# Patient Record
Sex: Female | Born: 1950 | Smoking: Former smoker
Health system: Southern US, Community
[De-identification: ages and names within clinical notes are randomized; demographics above are authoritative.]

## PROBLEM LIST (undated history)

## (undated) DIAGNOSIS — F419 Anxiety disorder, unspecified: Secondary | ICD-10-CM

## (undated) DIAGNOSIS — R112 Nausea with vomiting, unspecified: Secondary | ICD-10-CM

## (undated) DIAGNOSIS — T4145XA Adverse effect of unspecified anesthetic, initial encounter: Secondary | ICD-10-CM

## (undated) DIAGNOSIS — J189 Pneumonia, unspecified organism: Secondary | ICD-10-CM

## (undated) DIAGNOSIS — F329 Major depressive disorder, single episode, unspecified: Secondary | ICD-10-CM

## (undated) DIAGNOSIS — E871 Hypo-osmolality and hyponatremia: Secondary | ICD-10-CM

## (undated) DIAGNOSIS — J309 Allergic rhinitis, unspecified: Secondary | ICD-10-CM

## (undated) DIAGNOSIS — M1990 Unspecified osteoarthritis, unspecified site: Secondary | ICD-10-CM

## (undated) DIAGNOSIS — T7840XA Allergy, unspecified, initial encounter: Secondary | ICD-10-CM

## (undated) DIAGNOSIS — G709 Myoneural disorder, unspecified: Secondary | ICD-10-CM

## (undated) DIAGNOSIS — H269 Unspecified cataract: Secondary | ICD-10-CM

## (undated) DIAGNOSIS — E785 Hyperlipidemia, unspecified: Secondary | ICD-10-CM

## (undated) DIAGNOSIS — Z9889 Other specified postprocedural states: Secondary | ICD-10-CM

## (undated) DIAGNOSIS — E8889 Other specified metabolic disorders: Secondary | ICD-10-CM

## (undated) DIAGNOSIS — F32A Depression, unspecified: Secondary | ICD-10-CM

## (undated) DIAGNOSIS — M48061 Spinal stenosis, lumbar region without neurogenic claudication: Secondary | ICD-10-CM

## (undated) DIAGNOSIS — E119 Type 2 diabetes mellitus without complications: Secondary | ICD-10-CM

## (undated) DIAGNOSIS — C801 Malignant (primary) neoplasm, unspecified: Secondary | ICD-10-CM

## (undated) DIAGNOSIS — K219 Gastro-esophageal reflux disease without esophagitis: Secondary | ICD-10-CM

## (undated) DIAGNOSIS — H409 Unspecified glaucoma: Secondary | ICD-10-CM

## (undated) DIAGNOSIS — I10 Essential (primary) hypertension: Secondary | ICD-10-CM

## (undated) HISTORY — PX: CATARACT EXTRACTION: SUR2

## (undated) HISTORY — DX: Gastro-esophageal reflux disease without esophagitis: K21.9

## (undated) HISTORY — PX: EYE SURGERY: SHX253

## (undated) HISTORY — PX: COLONOSCOPY: SHX174

## (undated) HISTORY — PX: BACK SURGERY: SHX140

## (undated) HISTORY — DX: Allergy, unspecified, initial encounter: T78.40XA

## (undated) HISTORY — DX: Myoneural disorder, unspecified: G70.9

## (undated) HISTORY — DX: Essential (primary) hypertension: I10

## (undated) HISTORY — PX: TONSILLECTOMY: SUR1361

---

## 1971-02-05 DIAGNOSIS — T8859XA Other complications of anesthesia, initial encounter: Secondary | ICD-10-CM

## 1971-02-05 DIAGNOSIS — E8889 Other specified metabolic disorders: Secondary | ICD-10-CM

## 1971-02-05 HISTORY — DX: Other complications of anesthesia, initial encounter: T88.59XA

## 1971-02-05 HISTORY — DX: Other specified metabolic disorders: E88.89

## 2012-01-22 DIAGNOSIS — Z9889 Other specified postprocedural states: Secondary | ICD-10-CM | POA: Insufficient documentation

## 2012-01-22 DIAGNOSIS — F32A Depression, unspecified: Secondary | ICD-10-CM | POA: Insufficient documentation

## 2012-01-22 DIAGNOSIS — I10 Essential (primary) hypertension: Secondary | ICD-10-CM | POA: Diagnosis present

## 2013-02-04 HISTORY — PX: COLONOSCOPY: SHX174

## 2015-05-06 DIAGNOSIS — J189 Pneumonia, unspecified organism: Secondary | ICD-10-CM

## 2015-05-06 DIAGNOSIS — E871 Hypo-osmolality and hyponatremia: Secondary | ICD-10-CM

## 2015-05-06 HISTORY — DX: Hypo-osmolality and hyponatremia: E87.1

## 2015-05-06 HISTORY — DX: Pneumonia, unspecified organism: J18.9

## 2015-06-13 DIAGNOSIS — E109 Type 1 diabetes mellitus without complications: Secondary | ICD-10-CM | POA: Insufficient documentation

## 2015-06-13 DIAGNOSIS — H409 Unspecified glaucoma: Secondary | ICD-10-CM | POA: Insufficient documentation

## 2015-06-13 DIAGNOSIS — J309 Allergic rhinitis, unspecified: Secondary | ICD-10-CM | POA: Diagnosis present

## 2015-07-13 DIAGNOSIS — F4320 Adjustment disorder, unspecified: Secondary | ICD-10-CM | POA: Diagnosis present

## 2015-12-18 ENCOUNTER — Other Ambulatory Visit: Payer: Self-pay | Admitting: Neurosurgery

## 2016-02-07 ENCOUNTER — Inpatient Hospital Stay (HOSPITAL_COMMUNITY): Admission: RE | Admit: 2016-02-07 | Payer: Self-pay | Source: Ambulatory Visit

## 2016-02-13 ENCOUNTER — Encounter (HOSPITAL_COMMUNITY)
Admission: RE | Admit: 2016-02-13 | Discharge: 2016-02-13 | Disposition: A | Discharge status: Home / Self Care | Payer: Medicare Other | Source: Ambulatory Visit | Attending: Neurosurgery | Admitting: Neurosurgery

## 2016-02-13 ENCOUNTER — Ambulatory Visit (HOSPITAL_COMMUNITY)
Admission: RE | Admit: 2016-02-13 | Discharge: 2016-02-13 | Disposition: A | Discharge status: Home / Self Care | Payer: Medicare Other | Source: Ambulatory Visit | Attending: Anesthesiology | Admitting: Anesthesiology

## 2016-02-13 ENCOUNTER — Encounter (HOSPITAL_COMMUNITY): Payer: Self-pay

## 2016-02-13 DIAGNOSIS — Z01810 Encounter for preprocedural cardiovascular examination: Secondary | ICD-10-CM | POA: Insufficient documentation

## 2016-02-13 DIAGNOSIS — Z01818 Encounter for other preprocedural examination: Secondary | ICD-10-CM | POA: Insufficient documentation

## 2016-02-13 DIAGNOSIS — E108 Type 1 diabetes mellitus with unspecified complications: Secondary | ICD-10-CM | POA: Insufficient documentation

## 2016-02-13 HISTORY — DX: Pneumonia, unspecified organism: J18.9

## 2016-02-13 HISTORY — DX: Unspecified glaucoma: H40.9

## 2016-02-13 HISTORY — DX: Other specified postprocedural states: Z98.890

## 2016-02-13 HISTORY — DX: Other specified metabolic disorders: E88.89

## 2016-02-13 HISTORY — DX: Allergic rhinitis, unspecified: J30.9

## 2016-02-13 HISTORY — DX: Hypo-osmolality and hyponatremia: E87.1

## 2016-02-13 HISTORY — DX: Adverse effect of unspecified anesthetic, initial encounter: T41.45XA

## 2016-02-13 HISTORY — DX: Hyperlipidemia, unspecified: E78.5

## 2016-02-13 HISTORY — DX: Type 2 diabetes mellitus without complications: E11.9

## 2016-02-13 HISTORY — DX: Anxiety disorder, unspecified: F41.9

## 2016-02-13 HISTORY — DX: Unspecified cataract: H26.9

## 2016-02-13 HISTORY — DX: Major depressive disorder, single episode, unspecified: F32.9

## 2016-02-13 HISTORY — DX: Other specified postprocedural states: R11.2

## 2016-02-13 HISTORY — DX: Unspecified osteoarthritis, unspecified site: M19.90

## 2016-02-13 HISTORY — DX: Depression, unspecified: F32.A

## 2016-02-13 HISTORY — DX: Spinal stenosis, lumbar region without neurogenic claudication: M48.061

## 2016-02-13 HISTORY — DX: Malignant (primary) neoplasm, unspecified: C80.1

## 2016-02-13 LAB — BASIC METABOLIC PANEL
Anion gap: 6 (ref 5–15)
BUN: 13 mg/dL (ref 6–20)
CO2: 28 mmol/L (ref 22–32)
Calcium: 9.5 mg/dL (ref 8.9–10.3)
Chloride: 101 mmol/L (ref 101–111)
Creatinine, Ser: 0.79 mg/dL (ref 0.44–1.00)
GFR calc Af Amer: >60 mL/min (ref >60)
GFR calc non Af Amer: >60 mL/min (ref >60)
Glucose, Bld: 116 mg/dL — ABNORMAL HIGH (ref 65–99)
Potassium: 4 mmol/L (ref 3.5–5.1)
Sodium: 135 mmol/L (ref 135–145)

## 2016-02-13 LAB — CBC
HCT: 37.3 % (ref 36.0–46.0)
Hemoglobin: 12.7 g/dL (ref 12.0–15.0)
MCH: 31.2 pg (ref 26.0–34.0)
MCHC: 34 g/dL (ref 30.0–36.0)
MCV: 91.6 fL (ref 78.0–100.0)
Platelets: 272 10*3/uL (ref 150–400)
RBC: 4.07 MIL/uL (ref 3.87–5.11)
RDW: 12 % (ref 11.5–15.5)
WBC: 5.1 10*3/uL (ref 4.0–10.5)

## 2016-02-13 LAB — SURGICAL PCR SCREEN
MRSA, PCR: NEGATIVE
Staphylococcus aureus: NEGATIVE

## 2016-02-13 LAB — TYPE AND SCREEN
ABO/RH(D): O POS
Antibody Screen: NEGATIVE

## 2016-02-13 LAB — GLUCOSE, CAPILLARY: GLUCOSE-CAPILLARY: 108 mg/dL — AB (ref 65–99)

## 2016-02-13 LAB — ABO/RH: ABO/RH(D): O POS

## 2016-02-13 NOTE — Pre-Procedure Instructions (Addendum)
Connie Huang  02/13/2016      [facility], [address] [place] [address] Phone: [phone] Fax: [phone]    Your procedure is scheduled on Thursday, January 11.               Report to [facility] Admitting at 10:00 AM                Your surgery or procedure is scheduled for 1200 Noon   Call this number if you have problems the morning of surgery: [phone]                   For any other questions, please call [phone], Monday - Friday 8 AM - 4 PM.   Remember:  Do not eat food or drink liquids after midnight Wednesday, January 10.  Take these medicines the morning of surgery with A SIP OF WATER:gabapentin (NEURONTIN), venlafaxine (EFFEXOR).                   STOP taking Aspirin, Aspirin Products (Goody Powder, Excedrin Migraine), Ibuprofen (Advil), Naproxen (Aleve), Vitamins and Herbal Products (ie Fish Oil)                      AT Midnight on Wednesday, reduce basal rates by 20 %, unless instructed differently by your Endocrinologist.    How to Manage Your Diabetes Before and After Surgery  Why is it important to control my blood sugar before and after surgery? . Improving blood sugar levels before and after surgery helps healing and can limit problems. . A way of improving blood sugar control is eating a healthy diet by: o  Eating less sugar and carbohydrates o  Increasing activity/exercise o  Talking with your doctor about reaching your blood sugar goals . High blood sugars (greater than 180 mg/dL) can raise your risk of infections and slow your recovery, so you will need to focus on controlling your diabetes during the weeks before surgery. . Make sure that the doctor who takes care of your diabetes knows about your planned surgery including the date and location.  How do I manage my blood sugar before surgery? . Check your blood sugar at least 4 times a day, starting 2 days before  surgery, to make sure that the level is not too high or low. o Check your blood sugar the morning of your surgery when you wake up and every 2 hours until you get to the Short Stay unit. . If your blood sugar is less than 70 mg/dL, you will need to treat for low blood sugar: o Do not take insulin. o Treat a low blood sugar (less than 70 mg/dL) with  cup of clear juice (cranberry or apple), 4 glucose tablets, OR glucose gel. o Recheck blood sugar in 15 minutes after treatment (to make sure it is greater than 70 mg/dL). If your blood sugar is not greater than 70 mg/dL on recheck, call [phone] for further instructions. . Report your blood sugar to the short stay nurse when you get to Short Stay.  . If you are admitted to the hospital after surgery: o Your blood sugar will be checked by the staff and you will probably be given insulin after surgery (instead of oral diabetes medicines) to make sure you have good blood sugar levels. o The goal for blood sugar control after surgery is 80-180 mg/dL.  WHAT DO I DO ABOUT MY DIABETES MEDICATION     12:01 AM Thursday, reduce all basal rates by 20% unless instructed differently by your endocrinologist on how to manage Insulin when you are not eating or drinking.  Other Instructions:   Bring Insulin Pump supplies with you to the hospital.  Patient Signature:  Date:   Nurse Signature:  Date:    Do not wear jewelry, make-up or nail polish.  Do not wear lotions, powders, or perfumes, or deodorant.  Do not shave 48 hours prior to surgery.    Do not bring valuables to the hospital.  [facility] is not responsible for any belongings or valuables.  Contacts, dentures or bridgework may not be worn into surgery.  Leave your suitcase in the car.  After surgery it may be brought to your room.  For patients admitted to the hospital, discharge time will be determined by your treatment team.  Special instructions:  Review  [place] - Preparing For  Surgery.  Please read over the following fact sheets that you were given: [facility]- Preparing For Surgery and Patient Instructions for Mupirocin Application, Incentive Spirometry, Pain Booklet

## 2016-02-13 NOTE — Progress Notes (Signed)
Connie Huang has a history of Type I diabetes, she has an Insulin Pump. Insulin Pump has 2 Basal rates- 1 is set at 1200 mn and one at 0800, patient was instructed to  Decrease basal rates by 20 % at 2400 - Wednesday and 0800 Thursday.  Patient voiced understanding.  Patient was seen 02/06/16 at PCP in [place], [place].  Patient was started on an antibiotic and Tussionex.. Patient will complete Antibiotic on 02/15/16.  Connie Cadenas reports feeling much better.  Patient will follow up with PCP on 02/14/16 and she will receive lab results that were drawn on 02/06/16 then, one of the labs was an A1C.  I have requested labs and office notes.

## 2016-02-15 ENCOUNTER — Inpatient Hospital Stay (HOSPITAL_COMMUNITY)
Admission: RE | Admit: 2016-02-15 | Discharge: 2016-02-16 | DRG: 458 | Disposition: A | Discharge status: Home / Self Care | Payer: Medicare Other | Source: Ambulatory Visit | Attending: Neurosurgery | Admitting: Neurosurgery

## 2016-02-15 ENCOUNTER — Inpatient Hospital Stay (HOSPITAL_COMMUNITY): Payer: Medicare Other

## 2016-02-15 ENCOUNTER — Inpatient Hospital Stay (HOSPITAL_COMMUNITY): Payer: Medicare Other | Admitting: Certified Registered Nurse Anesthetist

## 2016-02-15 ENCOUNTER — Encounter (HOSPITAL_COMMUNITY): Payer: Self-pay | Admitting: Certified Registered Nurse Anesthetist

## 2016-02-15 ENCOUNTER — Encounter (HOSPITAL_COMMUNITY)
Admission: RE | Disposition: A | Discharge status: Home / Self Care | Payer: Self-pay | Source: Ambulatory Visit | Attending: Neurosurgery

## 2016-02-15 DIAGNOSIS — Z85820 Personal history of malignant melanoma of skin: Secondary | ICD-10-CM

## 2016-02-15 DIAGNOSIS — M549 Dorsalgia, unspecified: Secondary | ICD-10-CM | POA: Diagnosis present

## 2016-02-15 DIAGNOSIS — F419 Anxiety disorder, unspecified: Secondary | ICD-10-CM | POA: Diagnosis present

## 2016-02-15 DIAGNOSIS — H409 Unspecified glaucoma: Secondary | ICD-10-CM | POA: Diagnosis present

## 2016-02-15 DIAGNOSIS — E119 Type 2 diabetes mellitus without complications: Secondary | ICD-10-CM | POA: Diagnosis present

## 2016-02-15 DIAGNOSIS — M48061 Spinal stenosis, lumbar region without neurogenic claudication: Secondary | ICD-10-CM | POA: Diagnosis present

## 2016-02-15 DIAGNOSIS — M4186 Other forms of scoliosis, lumbar region: Secondary | ICD-10-CM | POA: Diagnosis present

## 2016-02-15 DIAGNOSIS — Z9641 Presence of insulin pump (external) (internal): Secondary | ICD-10-CM | POA: Diagnosis present

## 2016-02-15 DIAGNOSIS — Z981 Arthrodesis status: Secondary | ICD-10-CM | POA: Diagnosis not present

## 2016-02-15 DIAGNOSIS — Z794 Long term (current) use of insulin: Secondary | ICD-10-CM

## 2016-02-15 DIAGNOSIS — M5116 Intervertebral disc disorders with radiculopathy, lumbar region: Secondary | ICD-10-CM | POA: Diagnosis present

## 2016-02-15 DIAGNOSIS — I10 Essential (primary) hypertension: Secondary | ICD-10-CM | POA: Diagnosis present

## 2016-02-15 DIAGNOSIS — M419 Scoliosis, unspecified: Secondary | ICD-10-CM | POA: Diagnosis present

## 2016-02-15 DIAGNOSIS — Z419 Encounter for procedure for purposes other than remedying health state, unspecified: Secondary | ICD-10-CM

## 2016-02-15 HISTORY — PX: ANTERIOR LAT LUMBAR FUSION: SHX1168

## 2016-02-15 LAB — GLUCOSE, CAPILLARY
GLUCOSE-CAPILLARY: 227 mg/dL — AB (ref 65–99)
GLUCOSE-CAPILLARY: 300 mg/dL — AB (ref 65–99)
GLUCOSE-CAPILLARY: 293 mg/dL — AB (ref 65–99)
Glucose-Capillary: 240 mg/dL — ABNORMAL HIGH (ref 65–99)
Glucose-Capillary: 236 mg/dL — ABNORMAL HIGH (ref 65–99)
Glucose-Capillary: 304 mg/dL — ABNORMAL HIGH (ref 65–99)
Glucose-Capillary: 311 mg/dL — ABNORMAL HIGH (ref 65–99)

## 2016-02-15 SURGERY — ANTERIOR LATERAL LUMBAR FUSION 1 LEVEL
Anesthesia: General | Site: Flank | Laterality: Left

## 2016-02-15 MED ORDER — GABAPENTIN 800 MG PO TABS
800.0000 mg | ORAL_TABLET | Freq: Three times a day (TID) | ORAL | Status: DC
  Filled 2016-02-15: qty 1

## 2016-02-15 MED ORDER — HYDROMORPHONE HCL 1 MG/ML IJ SOLN
INTRAMUSCULAR | Status: AC
  Filled 2016-02-15: qty 1

## 2016-02-15 MED ORDER — CEFAZOLIN SODIUM-DEXTROSE 2-4 GM/100ML-% IV SOLN
2.0000 g | Freq: Three times a day (TID) | INTRAVENOUS | Status: AC
  Administered 2016-02-15 – 2016-02-16 (×2): 2 g via INTRAVENOUS
  Filled 2016-02-15 (×2): qty 100

## 2016-02-15 MED ORDER — ROCURONIUM BROMIDE 50 MG/5ML IV SOSY
PREFILLED_SYRINGE | INTRAVENOUS | Status: AC
  Filled 2016-02-15: qty 5

## 2016-02-15 MED ORDER — LIDOCAINE-EPINEPHRINE (PF) 2 %-1:200000 IJ SOLN
INTRAMUSCULAR | Status: AC
  Filled 2016-02-15: qty 20

## 2016-02-15 MED ORDER — FENTANYL CITRATE (PF) 100 MCG/2ML IJ SOLN
INTRAMUSCULAR | Status: AC
  Filled 2016-02-15: qty 2

## 2016-02-15 MED ORDER — FENTANYL CITRATE (PF) 100 MCG/2ML IJ SOLN
INTRAMUSCULAR | Status: DC | PRN
  Administered 2016-02-15 (×2): 25 ug via INTRAVENOUS
  Administered 2016-02-15: 50 ug via INTRAVENOUS
  Administered 2016-02-15: 100 ug via INTRAVENOUS

## 2016-02-15 MED ORDER — LIDOCAINE-EPINEPHRINE 1 %-1:100000 IJ SOLN
INTRAMUSCULAR | Status: DC | PRN
  Administered 2016-02-15: 5 mL

## 2016-02-15 MED ORDER — SM OMEGA-3-6-9 FATTY ACIDS PO CAPS: ORAL_CAPSULE | Freq: Two times a day (BID) | ORAL | Status: DC

## 2016-02-15 MED ORDER — HYDROMORPHONE HCL 1 MG/ML IJ SOLN
0.5000 mg | INTRAMUSCULAR | Status: DC | PRN
  Administered 2016-02-15: 1 mg via INTRAVENOUS

## 2016-02-15 MED ORDER — HYDROCOD POLST-CPM POLST ER 10-8 MG/5ML PO SUER: 5.0000 mL | Freq: Two times a day (BID) | ORAL | Status: DC | PRN

## 2016-02-15 MED ORDER — SODIUM CHLORIDE 0.9% FLUSH: 3.0000 mL | Freq: Two times a day (BID) | INTRAVENOUS | Status: DC

## 2016-02-15 MED ORDER — PANTOPRAZOLE SODIUM 40 MG IV SOLR: 40.0000 mg | Freq: Every day | INTRAVENOUS | Status: DC

## 2016-02-15 MED ORDER — MIDAZOLAM HCL 5 MG/5ML IJ SOLN
INTRAMUSCULAR | Status: DC | PRN
  Administered 2016-02-15: 2 mg via INTRAVENOUS

## 2016-02-15 MED ORDER — ATORVASTATIN CALCIUM 20 MG PO TABS: 10.0000 mg | ORAL_TABLET | Freq: Every day | ORAL | Status: DC

## 2016-02-15 MED ORDER — LACTATED RINGERS IV SOLN
INTRAVENOUS | Status: DC
  Administered 2016-02-15 (×2): via INTRAVENOUS

## 2016-02-15 MED ORDER — CEFAZOLIN SODIUM 1 G IJ SOLR
INTRAMUSCULAR | Status: AC
  Filled 2016-02-15: qty 10

## 2016-02-15 MED ORDER — OXYCODONE-ACETAMINOPHEN 5-325 MG PO TABS
ORAL_TABLET | ORAL | Status: AC
  Filled 2016-02-15: qty 2

## 2016-02-15 MED ORDER — LORATADINE 10 MG PO TABS
10.0000 mg | ORAL_TABLET | Freq: Every day | ORAL | Status: DC
  Administered 2016-02-15: 10 mg via ORAL
  Filled 2016-02-15: qty 1

## 2016-02-15 MED ORDER — CALCIUM CARB-CHOLECALCIFEROL 600-800 MG-UNIT PO TABS
1.0000 | ORAL_TABLET | Freq: Two times a day (BID) | ORAL | Status: DC
Start: 2016-02-15 — End: 2016-02-15

## 2016-02-15 MED ORDER — METHOCARBAMOL 500 MG PO TABS
500.0000 mg | ORAL_TABLET | Freq: Four times a day (QID) | ORAL | Status: DC | PRN
  Administered 2016-02-16: 500 mg via ORAL
  Filled 2016-02-15: qty 1

## 2016-02-15 MED ORDER — INSULIN PUMP
Freq: Three times a day (TID) | SUBCUTANEOUS | Status: DC
  Filled 2016-02-15: qty 1

## 2016-02-15 MED ORDER — BUPIVACAINE HCL (PF) 0.5 % IJ SOLN
INTRAMUSCULAR | Status: AC
  Filled 2016-02-15: qty 30

## 2016-02-15 MED ORDER — DOCUSATE SODIUM 100 MG PO CAPS
100.0000 mg | ORAL_CAPSULE | Freq: Two times a day (BID) | ORAL | Status: DC
  Administered 2016-02-15: 100 mg via ORAL
  Filled 2016-02-15: qty 1

## 2016-02-15 MED ORDER — THROMBIN 5000 UNITS EX SOLR
CUTANEOUS | Status: AC
  Filled 2016-02-15: qty 10000

## 2016-02-15 MED ORDER — KCL IN DEXTROSE-NACL 20-5-0.45 MEQ/L-%-% IV SOLN: INTRAVENOUS | Status: DC

## 2016-02-15 MED ORDER — VENLAFAXINE HCL 75 MG PO TABS
75.0000 mg | ORAL_TABLET | Freq: Every day | ORAL | Status: DC
  Filled 2016-02-15: qty 1

## 2016-02-15 MED ORDER — FENTANYL CITRATE (PF) 100 MCG/2ML IJ SOLN
25.0000 ug | INTRAMUSCULAR | Status: AC | PRN
  Administered 2016-02-15 (×6): 25 ug via INTRAVENOUS

## 2016-02-15 MED ORDER — ONDANSETRON HCL 4 MG/2ML IJ SOLN: 4.0000 mg | INTRAMUSCULAR | Status: DC | PRN

## 2016-02-15 MED ORDER — VITAMIN C 500 MG PO TABS
1000.0000 mg | ORAL_TABLET | Freq: Every day | ORAL | Status: DC
  Administered 2016-02-15: 1000 mg via ORAL
  Filled 2016-02-15 (×2): qty 2

## 2016-02-15 MED ORDER — PROPOFOL 10 MG/ML IV BOLUS
INTRAVENOUS | Status: DC | PRN
  Administered 2016-02-15: 150 mg via INTRAVENOUS

## 2016-02-15 MED ORDER — SODIUM CHLORIDE 0.9% FLUSH: 3.0000 mL | INTRAVENOUS | Status: DC | PRN

## 2016-02-15 MED ORDER — HYDROCODONE-ACETAMINOPHEN 5-325 MG PO TABS: 1.0000 | ORAL_TABLET | ORAL | Status: DC | PRN

## 2016-02-15 MED ORDER — ADULT MULTIVITAMIN W/MINERALS CH: 1.0000 | ORAL_TABLET | Freq: Every day | ORAL | Status: DC

## 2016-02-15 MED ORDER — CALCIUM CARBONATE-VITAMIN D 500-200 MG-UNIT PO TABS
1.0000 | ORAL_TABLET | Freq: Two times a day (BID) | ORAL | Status: DC
  Administered 2016-02-15: 1 via ORAL
  Filled 2016-02-15: qty 1

## 2016-02-15 MED ORDER — LOSARTAN POTASSIUM 50 MG PO TABS
100.0000 mg | ORAL_TABLET | Freq: Every day | ORAL | Status: DC
  Administered 2016-02-15: 100 mg via ORAL
  Filled 2016-02-15: qty 2

## 2016-02-15 MED ORDER — AMOXICILLIN-POT CLAVULANATE 875-125 MG PO TABS: 1.0000 | ORAL_TABLET | Freq: Two times a day (BID) | ORAL | Status: DC

## 2016-02-15 MED ORDER — ACETAMINOPHEN 650 MG RE SUPP: 650.0000 mg | RECTAL | Status: DC | PRN

## 2016-02-15 MED ORDER — ACETAMINOPHEN 325 MG PO TABS: 650.0000 mg | ORAL_TABLET | ORAL | Status: DC | PRN

## 2016-02-15 MED ORDER — GABAPENTIN 400 MG PO CAPS
400.0000 mg | ORAL_CAPSULE | Freq: Three times a day (TID) | ORAL | Status: DC
  Administered 2016-02-15 – 2016-02-16 (×2): 400 mg via ORAL
  Filled 2016-02-15 (×2): qty 1

## 2016-02-15 MED ORDER — INSULIN ASPART 100 UNIT/ML ~~LOC~~ SOLN: 0.0000 [IU] | Freq: Three times a day (TID) | SUBCUTANEOUS | Status: DC

## 2016-02-15 MED ORDER — FLUTICASONE PROPIONATE 50 MCG/ACT NA SUSP
2.0000 | Freq: Every day | NASAL | Status: DC | PRN
  Filled 2016-02-15: qty 16

## 2016-02-15 MED ORDER — ZOLPIDEM TARTRATE 5 MG PO TABS: 5.0000 mg | ORAL_TABLET | Freq: Every evening | ORAL | Status: DC | PRN

## 2016-02-15 MED ORDER — 0.9 % SODIUM CHLORIDE (POUR BTL) OPTIME
TOPICAL | Status: DC | PRN
  Administered 2016-02-15: 1000 mL

## 2016-02-15 MED ORDER — OXYCODONE-ACETAMINOPHEN 5-325 MG PO TABS
1.0000 | ORAL_TABLET | ORAL | Status: DC | PRN
  Administered 2016-02-15 – 2016-02-16 (×3): 2 via ORAL
  Filled 2016-02-15 (×2): qty 2

## 2016-02-15 MED ORDER — BIOTIN 10000 MCG PO TBDP: 10000.0000 ug | ORAL_TABLET | Freq: Every day | ORAL | Status: DC

## 2016-02-15 MED ORDER — TIMOLOL MALEATE 0.5 % OP SOLN
1.0000 [drp] | Freq: Every day | OPHTHALMIC | Status: DC
  Filled 2016-02-15: qty 5

## 2016-02-15 MED ORDER — SENNOSIDES-DOCUSATE SODIUM 8.6-50 MG PO TABS: 1.0000 | ORAL_TABLET | Freq: Every evening | ORAL | Status: DC | PRN

## 2016-02-15 MED ORDER — LIDOCAINE HCL (CARDIAC) 20 MG/ML IV SOLN
INTRAVENOUS | Status: DC | PRN
  Administered 2016-02-15: 100 mg via INTRAVENOUS

## 2016-02-15 MED ORDER — BISACODYL 10 MG RE SUPP: 10.0000 mg | Freq: Every day | RECTAL | Status: DC | PRN

## 2016-02-15 MED ORDER — SODIUM CHLORIDE 0.9 % IV SOLN: 250.0000 mL | INTRAVENOUS | Status: DC

## 2016-02-15 MED ORDER — FLEET ENEMA 7-19 GM/118ML RE ENEM: 1.0000 | ENEMA | Freq: Once | RECTAL | Status: DC | PRN

## 2016-02-15 MED ORDER — INSULIN ASPART 100 UNIT/ML ~~LOC~~ SOLN
0.0000 [IU] | Freq: Three times a day (TID) | SUBCUTANEOUS | Status: DC
  Administered 2016-02-15: 11 [IU] via SUBCUTANEOUS

## 2016-02-15 MED ORDER — ASPIRIN EC 81 MG PO TBEC
81.0000 mg | DELAYED_RELEASE_TABLET | Freq: Every day | ORAL | Status: DC
  Filled 2016-02-15: qty 1

## 2016-02-15 MED ORDER — TIMOLOL HEMIHYDRATE 0.5 % OP SOLN: 1.0000 [drp] | Freq: Every day | OPHTHALMIC | Status: DC

## 2016-02-15 MED ORDER — DEXTROSE 5 % IV SOLN
500.0000 mg | Freq: Four times a day (QID) | INTRAVENOUS | Status: DC | PRN
  Filled 2016-02-15: qty 5

## 2016-02-15 MED ORDER — PANTOPRAZOLE SODIUM 40 MG PO TBEC
40.0000 mg | DELAYED_RELEASE_TABLET | Freq: Every day | ORAL | Status: DC
  Administered 2016-02-15: 40 mg via ORAL
  Filled 2016-02-15: qty 1

## 2016-02-15 MED ORDER — BUPIVACAINE HCL (PF) 0.5 % IJ SOLN
INTRAMUSCULAR | Status: DC | PRN
  Administered 2016-02-15: 5 mL

## 2016-02-15 MED ORDER — PHENOL 1.4 % MT LIQD: 1.0000 | OROMUCOSAL | Status: DC | PRN

## 2016-02-15 MED ORDER — OMEGA-3-ACID ETHYL ESTERS 1 G PO CAPS
1.0000 g | ORAL_CAPSULE | Freq: Two times a day (BID) | ORAL | Status: DC
  Administered 2016-02-15: 1 g via ORAL
  Filled 2016-02-15 (×2): qty 1

## 2016-02-15 MED ORDER — MIDAZOLAM HCL 2 MG/2ML IJ SOLN
INTRAMUSCULAR | Status: AC
  Filled 2016-02-15: qty 2

## 2016-02-15 MED ORDER — SUCCINYLCHOLINE CHLORIDE 20 MG/ML IJ SOLN
INTRAMUSCULAR | Status: DC | PRN
  Administered 2016-02-15: 100 mg via INTRAVENOUS

## 2016-02-15 MED ORDER — MENTHOL 3 MG MT LOZG: 1.0000 | LOZENGE | OROMUCOSAL | Status: DC | PRN

## 2016-02-15 MED ORDER — CEFAZOLIN SODIUM 1 G IJ SOLR
INTRAMUSCULAR | Status: DC | PRN
  Administered 2016-02-15: 2 g via INTRAMUSCULAR

## 2016-02-15 MED ORDER — VITAMIN D 1000 UNITS PO TABS
5000.0000 [IU] | ORAL_TABLET | Freq: Every day | ORAL | Status: DC
  Administered 2016-02-15: 5000 [IU] via ORAL
  Filled 2016-02-15 (×3): qty 5

## 2016-02-15 MED ORDER — INSULIN ASPART 100 UNIT/ML ~~LOC~~ SOLN
0.0000 [IU] | Freq: Every day | SUBCUTANEOUS | Status: DC
  Administered 2016-02-15: 3 [IU] via SUBCUTANEOUS

## 2016-02-15 SURGICAL SUPPLY — 63 items
BLADE CLIPPER SURG (BLADE) IMPLANT
BOLT DECADE 5.5X40 (Bolt) ×2 IMPLANT
BOLT DECADE 5.5X40MM (Bolt) ×1 IMPLANT
BOLT SPNL LRG 45X5.5XPLAT NS (Screw) ×1 IMPLANT
CARTRIDGE OIL MAESTRO DRILL (MISCELLANEOUS) IMPLANT
COVER BACK TABLE 24X17X13 BIG (DRAPES) IMPLANT
COVER TABLE BACK 60X90 (DRAPES) ×3 IMPLANT
DECANTER SPIKE VIAL GLASS SM (MISCELLANEOUS) ×3 IMPLANT
DERMABOND ADVANCED (GAUZE/BANDAGES/DRESSINGS) ×2
DERMABOND ADVANCED .7 DNX12 (GAUZE/BANDAGES/DRESSINGS) ×1 IMPLANT
DIFFUSER DRILL AIR PNEUMATIC (MISCELLANEOUS) IMPLANT
DRAPE C-ARM 42X72 X-RAY (DRAPES) ×3 IMPLANT
DRAPE C-ARMOR (DRAPES) ×3 IMPLANT
DRAPE LAPAROTOMY 100X72X124 (DRAPES) ×3 IMPLANT
DRAPE POUCH INSTRU U-SHP 10X18 (DRAPES) ×3 IMPLANT
DRSG OPSITE POSTOP 3X4 (GAUZE/BANDAGES/DRESSINGS) ×6 IMPLANT
DURAPREP 26ML APPLICATOR (WOUND CARE) ×3 IMPLANT
ELECT REM PT RETURN 9FT ADLT (ELECTROSURGICAL) ×3
ELECTRODE REM PT RTRN 9FT ADLT (ELECTROSURGICAL) ×1 IMPLANT
GAUZE SPONGE 4X4 16PLY XRAY LF (GAUZE/BANDAGES/DRESSINGS) IMPLANT
GLOVE BIO SURGEON STRL SZ8 (GLOVE) ×3 IMPLANT
GLOVE BIOGEL PI IND STRL 7.0 (GLOVE) ×1 IMPLANT
GLOVE BIOGEL PI IND STRL 8 (GLOVE) ×6 IMPLANT
GLOVE BIOGEL PI IND STRL 8.5 (GLOVE) ×1 IMPLANT
GLOVE BIOGEL PI INDICATOR 7.0 (GLOVE) ×2
GLOVE BIOGEL PI INDICATOR 8 (GLOVE) ×12
GLOVE BIOGEL PI INDICATOR 8.5 (GLOVE) ×2
GLOVE ECLIPSE 7.5 STRL STRAW (GLOVE) ×9 IMPLANT
GLOVE EXAM NITRILE LRG STRL (GLOVE) IMPLANT
GLOVE EXAM NITRILE XL STR (GLOVE) IMPLANT
GLOVE EXAM NITRILE XS STR PU (GLOVE) IMPLANT
GLOVE SURG SS PI 6.5 STRL IVOR (GLOVE) ×3 IMPLANT
GOWN STRL REUS W/ TWL LRG LVL3 (GOWN DISPOSABLE) ×1 IMPLANT
GOWN STRL REUS W/ TWL XL LVL3 (GOWN DISPOSABLE) ×3 IMPLANT
GOWN STRL REUS W/TWL 2XL LVL3 (GOWN DISPOSABLE) ×9 IMPLANT
GOWN STRL REUS W/TWL LRG LVL3 (GOWN DISPOSABLE) ×2
GOWN STRL REUS W/TWL XL LVL3 (GOWN DISPOSABLE) ×6
KIT BASIN OR (CUSTOM PROCEDURE TRAY) ×3 IMPLANT
KIT DILATOR XLIF 5 (KITS) ×1 IMPLANT
KIT INFUSE XX SMALL 0.7CC (Orthopedic Implant) ×3 IMPLANT
KIT ROOM TURNOVER OR (KITS) ×3 IMPLANT
KIT SURGICAL ACCESS MAXCESS 4 (KITS) ×3 IMPLANT
KIT XLIF (KITS) ×2
MODULE NVM5 NEXT GEN EMG (NEEDLE) ×3 IMPLANT
MODULUS XLW 10X22X50MM 10DEG (Spine Construct) ×3 IMPLANT
NEEDLE HYPO 25X1 1.5 SAFETY (NEEDLE) ×3 IMPLANT
NS IRRIG 1000ML POUR BTL (IV SOLUTION) ×3 IMPLANT
OIL CARTRIDGE MAESTRO DRILL (MISCELLANEOUS)
PACK LAMINECTOMY NEURO (CUSTOM PROCEDURE TRAY) ×3 IMPLANT
PLATE 2H 10MM (Plate) ×3 IMPLANT
PUTTY BONE ATTRAX 5CC STRIP (Putty) ×3 IMPLANT
SCREW 45MM (Screw) ×2 IMPLANT
SPONGE LAP 4X18 X RAY DECT (DISPOSABLE) IMPLANT
SPONGE SURGIFOAM ABS GEL SZ50 (HEMOSTASIS) IMPLANT
STAPLER SKIN PROX WIDE 3.9 (STAPLE) ×3 IMPLANT
SUT VIC AB 1 CT1 18XBRD ANBCTR (SUTURE) ×1 IMPLANT
SUT VIC AB 1 CT1 8-18 (SUTURE) ×2
SUT VIC AB 2-0 CT1 18 (SUTURE) ×3 IMPLANT
SUT VIC AB 3-0 SH 8-18 (SUTURE) ×3 IMPLANT
TOWEL OR 17X24 6PK STRL BLUE (TOWEL DISPOSABLE) ×3 IMPLANT
TOWEL OR 17X26 10 PK STRL BLUE (TOWEL DISPOSABLE) ×3 IMPLANT
TRAY FOLEY W/METER SILVER 16FR (SET/KITS/TRAYS/PACK) ×3 IMPLANT
WATER STERILE IRR 1000ML POUR (IV SOLUTION) ×3 IMPLANT

## 2016-02-15 NOTE — Anesthesia Postprocedure Evaluation (Signed)
Anesthesia Post Note  Patient: Jerzie Sidman Kanady  Procedure(s) Performed: Procedure(s) (LRB): LEFT LUMBAR THREE-FOUR ANTEROLATERAL LUMBAR INTERBODY FUSION WITH LATERAL PLATE (Left)  Patient location during evaluation: PACU Anesthesia Type: General Level of consciousness: awake Pain management: pain level controlled Vital Signs Assessment: post-procedure vital signs reviewed and stable Respiratory status: spontaneous breathing Cardiovascular status: stable Anesthetic complications: no       Last Vitals:  Vitals:   02/15/16 1700 02/15/16 1715  BP:    Pulse: 91 92  Resp: 15 16  Temp:      Last Pain:  Vitals:   02/15/16 1715  TempSrc:   PainSc: Asleep                 Yukiko Minnich

## 2016-02-15 NOTE — Anesthesia Preprocedure Evaluation (Addendum)
Anesthesia Evaluation  Patient identified by MRN, date of birth, ID band Patient awake    Reviewed: Allergy & Precautions, NPO status , Patient's Chart, lab work & pertinent test results  History of Anesthesia Complications (+) PONV  Airway Mallampati: II  TM Distance: >3 FB     Dental   Pulmonary pneumonia, former smoker,    breath sounds clear to auscultation       Cardiovascular negative cardio ROS   Rhythm:Regular Rate:Normal     Neuro/Psych    GI/Hepatic negative GI ROS, Neg liver ROS,   Endo/Other  diabetes  Renal/GU negative Renal ROS     Musculoskeletal   Abdominal   Peds  Hematology   Anesthesia Other Findings   Reproductive/Obstetrics                             Anesthesia Physical Anesthesia Plan  ASA: III  Anesthesia Plan: General   Post-op Pain Management:    Induction: Intravenous  Airway Management Planned: Oral ETT  Additional Equipment:   Intra-op Plan:   Post-operative Plan: Possible Post-op intubation/ventilation  Informed Consent: I have reviewed the patients History and Physical, chart, labs and discussed the procedure including the risks, benefits and alternatives for the proposed anesthesia with the patient or authorized representative who has indicated his/her understanding and acceptance.   Dental advisory given  Plan Discussed with: CRNA  Anesthesia Plan Comments:         Anesthesia Quick Evaluation

## 2016-02-15 NOTE — Transfer of Care (Signed)
Immediate Anesthesia Transfer of Care Note  Patient: Connie Huang  Procedure(s) Performed: Procedure(s): LEFT LUMBAR THREE-FOUR ANTEROLATERAL LUMBAR INTERBODY FUSION WITH LATERAL PLATE (Left)  Patient Location: PACU  Anesthesia Type:General  Level of Consciousness: sedated and patient cooperative  Airway & Oxygen Therapy: Patient Spontanous Breathing and Patient connected to nasal cannula oxygen  Post-op Assessment: Report given to RN, Post -op Vital signs reviewed and stable and Patient moving all extremities  Post vital signs: Reviewed and stable  Last Vitals:  Vitals:   02/15/16 0954  BP: (!) 148/73  Pulse: 83  Resp: 18  Temp: 36.7 C    Last Pain:  Vitals:   02/15/16 0954  TempSrc: Oral         Complications: No apparent anesthesia complications

## 2016-02-15 NOTE — Brief Op Note (Signed)
02/15/2016  3:36 PM  PATIENT:  Connie Huang  66 y.o. female  PRE-OPERATIVE DIAGNOSIS:  LUMBAR FORAMINAL STENOSIS with scoliosis, herniated disc, radiculopathy L 34 level  POST-OPERATIVE DIAGNOSIS:  LUMBAR FORAMINAL STENOSIS with scoliosis, herniated disc, radiculopathy L 34 level   PROCEDURE:  Procedure(s): LEFT LUMBAR THREE-FOUR ANTEROLATERAL LUMBAR INTERBODY FUSION WITH LATERAL PLATE (Left)  SURGEON:  Surgeon(s) and Role:    * Erline Levine, MD - Primary  PHYSICIAN ASSISTANT:   ASSISTANTS: Poteat, RN, Jake Bathe PAS   ANESTHESIA:   general  EBL:  Total I/O In: 1200 [I.V.:1200] Out: 320 [Urine:305; Blood:15]  BLOOD ADMINISTERED:none  DRAINS: none   LOCAL MEDICATIONS USED:  MARCAINE    and LIDOCAINE   SPECIMEN:  No Specimen  DISPOSITION OF SPECIMEN:  N/A  COUNTS:  YES  TOURNIQUET:  * No tourniquets in log *   DICTATION: Patient is a 66 year old with severe spondylosis stenosis and scoliosis of the lumbar spine with lateral disc herniation and foraminal stenosis on the left with left leg pain and weakness. It was elected to take her to surgery for anterolateral decompression and lateral plate fixation at the  L 34 level.  She had previously undergone fusion L 45 level.  Procedure: Patient was brought to the operating room and placed in a right lateral decubitus position on the operative table and using orthogonally projected C-arm fluoroscopy the patient was placed so that the L 34  level was visualized in AP and lateral plane. The patient was then taped into position. The table was flexed so as to expose the L 34 level. Skin was marked along with a posterior finger dissection incision. Her flank was then prepped and draped in usual sterile fashion and incisions were made. Posterior finger dissection was made to enter the retroperitoneal space and then subsequently the probe was inserted into the psoas muscle from the left side initially at the L 34 level. After mapping  the neural elements were able to dock the probe at the anterior third of this vertebral level, because any posterior to this, we were in too close proximity to neural elements electrically. Subsequently the self-retaining tractor was.after sequential dilators were utilized the shim was employed and the interspace was cleared of psoas muscle and then incised. A thorough discectomy was performed. Instruments were used to clear the interspace of disc material. After thorough discectomy was performed and this was performed using AP and lateral fluoroscopy a 10 lordotic by 50 x 22 mm implant was packed with BMP and Attrax bone graft extender. This was tamped into position using the slides and its position was confirmed on AP and lateral fluoroscopy. A Decade 2-hole lateral plate (size 10) was affixed to the lateral spine with  5.5 x 40 and 45 mm bolts. All screws were torque locked and positioning was confirmed with AP and lateral fluoroscopy. . Hemostasis was assured the wounds were irrigated interrupted Vicryl sutures.Sterile occlusive dressing was placed with Dermabond and occlusive dressings. The patient was then extubated in the operating room and taken to recovery in stable and satisfactory condition having tolerated her operation well. Counts were correct at the end of the case.  PLAN OF CARE: Admit to inpatient   PATIENT DISPOSITION:  PACU - hemodynamically stable.   Delay start of Pharmacological VTE agent (>24hrs) due to surgical blood loss or risk of bleeding: yes  Retractor times:  25:08 XLIF,16:48 plate

## 2016-02-15 NOTE — Progress Notes (Signed)

## 2016-02-15 NOTE — Progress Notes (Signed)
Awake, alert, conversant.  MAEW with good strength.  Less left leg pain than preop.

## 2016-02-15 NOTE — H&P (Signed)
Patient ID:   [id number] Patient: Connie Huang  Date of Birth: 10/16/1950 Visit Type: Office Visit   Date: 12/13/2015 01:00 PM Provider: Marchia Meiers. Vertell Limber MD   This 66 year old female presents for back pain.  History of Present Illness: 1.  back pain    Connie Huang, 66 year old retired female, visits for evaluation of back and left hip to left knee pain.  She recalls lifting her mother in May, since which time she has felt increasing pain.   ESI 3 offered little relief Physical therapy caused increased pain  Tramadol, muscle relaxer, Norco offered no pain relief Gabapentin 800 mg 3 times a day "helps some"  History: Melanoma, HTN, IDDM, glaucoma, GERD, anxiety Surgical history: Tonsillectomy 1973, melanoma excision in 2012, lumbar fusion L4-L5 2013  X-rays on [facility] reveal well-healed previous fusion at L4-5, levoconvex scoliosis   09/05/15 MRI: Severe spinal stenosis at L3-4, with severe left foraminal stenosis, and secondary compression of the exiting nerve roots on the left. There is mild right foraminal stenosis. Status post posterior lumbar fusion at L4-5, without significant spinal or foraminal stenosis. Mild spinal and right foraminal stenosis at L2-3. No significant left foraminal stenosis. No significant spinal or foraminal stenosis othewise.        PAST MEDICAL/SURGICAL HISTORY   (Detailed)  Disease/disorder Onset Date Management Date Comments  Absolute glaucoma    [facility] 12/13/2015 -  Arthritis    [facility] 12/13/2015 -  Depression      Diabetes mellitus    [facility] 12/13/2015 -  Hypertension         PAST MEDICAL HISTORY, SURGICAL HISTORY, FAMILY HISTORY, SOCIAL HISTORY AND REVIEW OF SYSTEMS  12/13/2015, which I have signed.  Family History  (Detailed) Relationship Family Member Name Deceased Age at Death Condition Onset Age Cause of Death  Father    Hypertension  N  Father    Coronary artery disease  N    SOCIAL HISTORY  (Detailed) Tobacco use  reviewed. Preferred language is Unknown.   Smoking status: Never smoker.  SMOKING STATUS Use Status Type Smoking Status Usage Per Day Years Used Total Pack Years  no/never  Never smoker             MEDICATIONS(added, continued or stopped this visit): Started Medication Directions Instruction Stopped   aspirin 81 mg tablet,delayed release take 1 tablet by oral route  every day     atorvastatin 10 mg tablet take 1 tablet by oral route  every day     Betimol 0.5 % eye drops instill 1 drop by ophthalmic route 2 times every day into affected eye(s)     biotin 1 mg capsule      Calcium 500 500 mg calcium (1,250 mg) tablet      Flonase Allergy Relief 50 mcg/actuation nasal spray,suspension spray 1 - 2 spray by intranasal route  every day in each nostril as needed     gabapentin 800 mg tablet take 1 tablet by oral route 3 times every day     Humalog 100 unit/mL subcutaneous solution inject by subcutaneous route per prescriber's instructions. Insulin dosing requires individualization.     Insulin Pump [id number]      losartan potassium 100 mg ORAL      Multi Vitamin 9 mg iron/15 mL oral liquid      Omega-3 350 mg-235 mg-90 mg-597 mg capsule,delayed release      venlafaxine 75 mg tablet take 1 tablet by oral route 2 times every day with food  ALLERGIES: Ingredient Reaction Medication Name Comment  DOXEPIN HCL  Zonalon   ERYTHROMYCIN BASE Unknown     Reviewed, updated.    Vitals Date Temp F BP Pulse Ht In Wt Lb BMI BSA Pain Score  12/13/2015  148/72 88 66 177 28.57  1/10     PHYSICAL EXAM General Level of Distress: no acute distress Overall Appearance: normal    Cardiovascular Cardiac: regular rate and rhythm without murmur  Respiratory Lungs: clear to auscultation  Neurological Recent and Remote Memory: normal Attention Span and Concentration:   normal Language: normal Fund of Knowledge: normal  Right Left Sensation: normal normal Upper Extremity  Coordination: normal normal  Lower Extremity Coordination: normal normal  Musculoskeletal Gait and Station: normal  Right Left Upper Extremity Muscle Strength: normal normal Lower Extremity Muscle Strength: normal normal Upper Extremity Muscle Tone:  normal normal Lower Extremity Muscle Tone: normal normal  Motor Strength Upper and lower extremity motor strength was tested in the clinically pertinent muscles.     Deep Tendon Reflexes  Right Left Biceps: normal normal Triceps: normal normal Brachiloradialis: normal normal Patellar: normal normal Achilles: normal normal  Sensory Sensation was tested at L1 to S1.   Cranial Nerves II. Optic Nerve/Visual Fields: normal III. Oculomotor: normal IV. Trochlear: normal V. Trigeminal: normal VI. Abducens: normal VII. Facial: normal VIII. Acoustic/Vestibular: normal IX. Glossopharyngeal: normal X. Vagus: normal XI. Spinal Accessory: normal XII. Hypoglossal: normal  Motor and other Tests Lhermittes: negative Rhomberg: negative    Right Left Hoffman's: normal normal Clonus: normal normal Babinski: normal normal SLR: negative positive Patrick's Corky Sox): negative negative Toe Walk: normal normal Toe Lift: normal normal Heel Walk: normal normal SI Joint: nontender nontender   Additional Findings:  Left sciatic notch discomfort, toe touch to her knees, difficulty with dip testing on the left, UE and LE strength is normal, symmetric reflexes, positive SLR of 45 degrees on the left   DIAGNOSTIC RESULTS X-rays on [facility] reveal well-healed previous fusion at L4-5, levoconvex scoliosis   09/05/15 MRI: Severe spinal stenosis at L3-4, with severe left foraminal stenosis, and secondary compression of the exiting nerve roots on the left. There is mild right foraminal stenosis. Status post posterior lumbar fusion at L4-5, without significant spinal or foraminal stenosis. Mild spinal and right foraminal stenosis at L2-3. No  significant left foraminal stenosis. No significant spinal or foraminal stenosis othewise.    IMPRESSION The patient is experiencing low back and left leg pain that radiates down to her knee. On review of her MRI, she has severe spinal stenosis at the L3-4 level with severe left foraminal stenosis that is causing compression of the exiting nerve root. She also has degeneration of L2-3, but it is not significant. On confrontational testing, she has left sciatic notch discomfort, positive SLR on the left, and left quadriceps weakness on dip testing. Due to these findings and her severe narrowing, I recommend an L3-4 XLIF with lateral plating for alleviation of her low back and left leg pain.   Completed Orders (this encounter) Order Details Reason Side Interpretation Result Initial Treatment Date Region  Lumbar Spine- AP/Lat/Flex/Ex      12/13/2015 All Levels to All Levels   Assessment/Plan # Detail Type Description   1. Assessment Lumbar radiculopathy (M54.16).         Schedule L3-4 XLIF. Nurse education given. Fitted for LSO brace.   Orders: Diagnostic Procedures: Assessment Procedure  M54.16 Lumbar Spine- AP/Lat/Flex/Ex  Provider:  Marchia Meiers. Vertell Limber MD  12/13/2015 02:47 PM Dictation edited by: Johnella Moloney    CC Providers: Otho [place] [address]-              Electronically signed by Marchia Meiers. Vertell Limber MD on 12/16/2015 03:36 PM

## 2016-02-15 NOTE — Addendum Note (Signed)
Addendum  created 02/15/16 1826 by Belinda Block, MD   Sign clinical note, Visit Navigator Flowsheet section accepted

## 2016-02-15 NOTE — Interval H&P Note (Signed)
History and Physical Interval Note:  02/15/2016 12:10 PM  Connie Huang  has presented today for surgery, with the diagnosis of LUMBAR FORAMINAL STENOSIS  The various methods of treatment have been discussed with the patient and family. After consideration of risks, benefits and other options for treatment, the patient has consented to  Procedure(s) with comments: LEFT L3-4 ANTEROLATERAL LUMBAR INTERBODY FUSION WITH LATERAL PLATE (Left) - LEFT [id number] ANTEROLATERAL LUMBAR INTERBODY FUSION WITH LATERAL PLATE as a surgical intervention .  The patient's history has been reviewed, patient examined, no change in status, stable for surgery.  I have reviewed the patient's chart and labs.  Questions were answered to the patient's satisfaction.     Akul Leggette D

## 2016-02-15 NOTE — Anesthesia Procedure Notes (Signed)
Procedure Name: Intubation Date/Time: 02/15/2016 12:46 PM Performed by: Shirlyn Goltz Pre-anesthesia Checklist: Patient identified, Emergency Drugs available, Suction available and Patient being monitored Patient Re-evaluated:Patient Re-evaluated prior to inductionOxygen Delivery Method: Circle system utilized Preoxygenation: Pre-oxygenation with 100% oxygen Intubation Type: IV induction Ventilation: Mask ventilation without difficulty Laryngoscope Size: Mac and 3 Grade View: Grade II Tube type: Oral Tube size: 7.0 mm Number of attempts: 1 Airway Equipment and Method: Stylet Placement Confirmation: ETT inserted through vocal cords under direct vision,  positive ETCO2 and breath sounds checked- equal and bilateral Secured at: 22 cm Tube secured with: Tape Dental Injury: Teeth and Oropharynx as per pre-operative assessment

## 2016-02-15 NOTE — Op Note (Signed)
02/15/2016  3:36 PM  PATIENT:  Connie Huang  66 y.o. female  PRE-OPERATIVE DIAGNOSIS:  LUMBAR FORAMINAL STENOSIS with scoliosis, herniated disc, radiculopathy L 34 level  POST-OPERATIVE DIAGNOSIS:  LUMBAR FORAMINAL STENOSIS with scoliosis, herniated disc, radiculopathy L 34 level   PROCEDURE:  Procedure(s): LEFT LUMBAR THREE-FOUR ANTEROLATERAL LUMBAR INTERBODY FUSION WITH LATERAL PLATE (Left)  SURGEON:  Surgeon(s) and Role:    * Erline Levine, MD - Primary  PHYSICIAN ASSISTANT:   ASSISTANTS: Poteat, RN, Jake Bathe PAS   ANESTHESIA:   general  EBL:  Total I/O In: 1200 [I.V.:1200] Out: 320 [Urine:305; Blood:15]  BLOOD ADMINISTERED:none  DRAINS: none   LOCAL MEDICATIONS USED:  MARCAINE    and LIDOCAINE   SPECIMEN:  No Specimen  DISPOSITION OF SPECIMEN:  N/A  COUNTS:  YES  TOURNIQUET:  * No tourniquets in log *   DICTATION: Patient is a 66 year old with severe spondylosis stenosis and scoliosis of the lumbar spine with lateral disc herniation and foraminal stenosis on the left with left leg pain and weakness. It was elected to take her to surgery for anterolateral decompression and lateral plate fixation at the  L 34 level.  She had previously undergone fusion L 45 level.  Procedure: Patient was brought to the operating room and placed in a right lateral decubitus position on the operative table and using orthogonally projected C-arm fluoroscopy the patient was placed so that the L 34  level was visualized in AP and lateral plane. The patient was then taped into position. The table was flexed so as to expose the L 34 level. Skin was marked along with a posterior finger dissection incision. Her flank was then prepped and draped in usual sterile fashion and incisions were made. Posterior finger dissection was made to enter the retroperitoneal space and then subsequently the probe was inserted into the psoas muscle from the left side initially at the L 34 level. After mapping  the neural elements were able to dock the probe at the anterior third of this vertebral level, because any posterior to this, we were in too close proximity to neural elements electrically. Subsequently the self-retaining tractor was.after sequential dilators were utilized the shim was employed and the interspace was cleared of psoas muscle and then incised. A thorough discectomy was performed. Instruments were used to clear the interspace of disc material. After thorough discectomy was performed and this was performed using AP and lateral fluoroscopy a 10 lordotic by 50 x 22 mm implant was packed with BMP and Attrax bone graft extender. This was tamped into position using the slides and its position was confirmed on AP and lateral fluoroscopy. A Decade 2-hole lateral plate (size 10) was affixed to the lateral spine with  5.5 x 40 and 45 mm bolts. All screws were torque locked and positioning was confirmed with AP and lateral fluoroscopy. . Hemostasis was assured the wounds were irrigated interrupted Vicryl sutures.Sterile occlusive dressing was placed with Dermabond and occlusive dressings. The patient was then extubated in the operating room and taken to recovery in stable and satisfactory condition having tolerated her operation well. Counts were correct at the end of the case.  PLAN OF CARE: Admit to inpatient   PATIENT DISPOSITION:  PACU - hemodynamically stable.   Delay start of Pharmacological VTE agent (>24hrs) due to surgical blood loss or risk of bleeding: yes  Retractor times:  25:08 XLIF,16:48 plate

## 2016-02-16 ENCOUNTER — Encounter (HOSPITAL_COMMUNITY): Payer: Self-pay | Admitting: Neurosurgery

## 2016-02-16 LAB — GLUCOSE, CAPILLARY
Glucose-Capillary: 344 mg/dL — ABNORMAL HIGH (ref 65–99)
Glucose-Capillary: 276 mg/dL — ABNORMAL HIGH (ref 65–99)

## 2016-02-16 MED ORDER — OXYCODONE-ACETAMINOPHEN 5-325 MG PO TABS: 1.0000 | ORAL_TABLET | ORAL | 0 refills | Status: DC | PRN

## 2016-02-16 MED ORDER — METHOCARBAMOL 500 MG PO TABS: 500.0000 mg | ORAL_TABLET | Freq: Four times a day (QID) | ORAL | 1 refills | Status: DC | PRN

## 2016-02-16 NOTE — Evaluation (Signed)
Physical Therapy Evaluation Patient Details Name: Connie Huang MRN: [id number] DOB: 1950/03/22 Today's Date: 02/16/2016   History of Present Illness  Pt is a 66 y/o female who presents s/p L3-L4 ALIF on 02/15/16.  Clinical Impression  Pt admitted with above diagnosis. Pt currently with functional limitations due to the deficits listed below (see PT Problem List). At the time of PT eval pt was able to perform transfers and ambulation with min guard to min assist for balance support and safety. Pt with many questions regarding caring for her dog. Pt was educated on lifting restriction as well as risks associated with co-sleeping with pets from an infection prevention standpoint. Pt will benefit from skilled PT to increase their independence and safety with mobility to allow discharge to the venue listed below. Will continue to follow until d/c.      Follow Up Recommendations Outpatient PT    Equipment Recommendations  None recommended by PT    Recommendations for Other Services       Precautions / Restrictions Precautions Precautions: Fall;Back Precaution Booklet Issued: Yes (comment) Precaution Comments: Reviewed handout, and pt was cued for precautions during functional mobility.  Required Braces or Orthoses: Spinal Brace Spinal Brace: Lumbar corset;Applied in sitting position Restrictions Weight Bearing Restrictions: No      Mobility  Bed Mobility Overal bed mobility: Needs Assistance Bed Mobility: Rolling;Sidelying to Sit;Sit to Sidelying Rolling: Supervision Sidelying to sit: Min assist     Sit to sidelying: Min assist General bed mobility comments: Bed hieght elevated to simulate home environment. Pt required assist to scoot and transition to/from EOB. VC's for log roll technique.   Transfers Overall transfer level: Needs assistance Equipment used: None Transfers: Sit to/from Stand Sit to Stand: Min guard         General transfer comment: Close guard for  safety. Pt was able to achieve without assist  Ambulation/Gait Ambulation/Gait assistance: Min guard Ambulation Distance (Feet): 200 Feet Assistive device: None Gait Pattern/deviations: Step-through pattern;Decreased stride length;Trunk flexed Gait velocity: Decreased Gait velocity interpretation: Below normal speed for age/gender General Gait Details: VC's for Improved posture. Generally slow but steady.  Stairs Stairs: Yes Stairs assistance: Min guard Stair Management: One rail Left;Step to pattern;Forwards Number of Stairs: 10 General stair comments: VC's for general sequencing and safety  Wheelchair Mobility    Modified Rankin (Stroke Patients Only)       Balance Overall balance assessment: Needs assistance Sitting-balance support: Feet supported;No upper extremity supported Sitting balance-Leahy Scale: Good     Standing balance support: No upper extremity supported;During functional activity Standing balance-Leahy Scale: Fair                               Pertinent Vitals/Pain Pain Assessment: 0-10 Pain Score: 5  Pain Location: Incision site Pain Descriptors / Indicators: Operative site guarding Pain Intervention(s): Limited activity within patient's tolerance;Monitored during session;Repositioned    Home Living Family/patient expects to be discharged to:: Private residence Living Arrangements: Alone Available Help at Discharge: Family;Available 24 hours/day (Initially) Type of Home: House Home Access: Stairs to enter Entrance Stairs-Rails: Left Entrance Stairs-Number of Steps: 2 Home Layout: Two level;Able to live on main level with bedroom/bathroom Home Equipment: Gilford Rile - 2 wheels;Cane - single point;Bedside commode;Wheelchair - manual      Prior Function Level of Independence: Independent               Hand Dominance  Extremity/Trunk Assessment   Upper Extremity Assessment Upper Extremity Assessment: Defer to OT  evaluation    Lower Extremity Assessment Lower Extremity Assessment: Generalized weakness    Cervical / Trunk Assessment Cervical / Trunk Assessment: Kyphotic  Communication   Communication: No difficulties  Cognition Arousal/Alertness: Awake/alert Behavior During Therapy: WFL for tasks assessed/performed Overall Cognitive Status: Within Functional Limits for tasks assessed                      General Comments      Exercises     Assessment/Plan    PT Assessment Patient needs continued PT services  PT Problem List Decreased strength;Decreased range of motion;Decreased activity tolerance;Decreased balance;Decreased mobility;Decreased knowledge of use of DME;Decreased safety awareness;Decreased knowledge of precautions;Pain          PT Treatment Interventions DME instruction;Gait training;Stair training;Functional mobility training;Therapeutic activities;Therapeutic exercise;Neuromuscular re-education;Patient/family education    PT Goals (Current goals can be found in the Care Plan section)  Acute Rehab PT Goals Patient Stated Goal: Home today PT Goal Formulation: With patient Time For Goal Achievement: 02/23/16 Potential to Achieve Goals: Good    Frequency Min 5X/week   Barriers to discharge        Co-evaluation               End of Session Equipment Utilized During Treatment: Gait belt;Back brace Activity Tolerance: Patient tolerated treatment well Patient left: in chair;with call bell/phone within reach Nurse Communication: Mobility status         Time: [id number] PT Time Calculation (min) (ACUTE ONLY): 34 min   Charges:   PT Evaluation $PT Eval Moderate Complexity: 1 Procedure PT Treatments $Gait Training: 8-22 mins   PT G Codes:        Thelma Comp 12-Mar-2016, 11:56 AM  Rolinda Roan, PT, DPT Acute Rehabilitation Services Pager: [phone]

## 2016-02-16 NOTE — Discharge Instructions (Signed)

## 2016-02-16 NOTE — Progress Notes (Signed)
Subjective: Patient reports doing well.  Leg pain better, back not painful.  Objective: Vital signs in last 24 hours: Temp:  [97.2 F (36.2 C)-98.4 F (36.9 C)] 98 F (36.7 C) (01/12 0400) Pulse Rate:  [83-97] 85 (01/12 0400) Resp:  [13-19] 18 (01/12 0400) BP: (115-148)/(53-74) 122/60 (01/12 0400) SpO2:  [94 %-100 %] 97 % (01/12 0400) Weight:  [83.5 kg (184 lb 3 oz)] 83.5 kg (184 lb 3 oz) (01/11 0954)  Intake/Output from previous day: 01/11 0701 - 01/12 0700 In: 1400 [I.V.:1400] Out: 1370 [Urine:1355; Blood:15] Intake/Output this shift: No intake/output data recorded.  Physical Exam: Strength full in legs, ambulating without difficulty.  Incisions CDI.  Lab Results:  Recent Labs  02/13/16 1419  WBC 5.1  HGB 12.7  HCT 37.3  PLT 272   BMET  Recent Labs  02/13/16 1419  NA 135  K 4.0  CL 101  CO2 28  GLUCOSE 116*  BUN 13  CREATININE 0.79  CALCIUM 9.5    Studies/Results: Dg Lumbar Spine 2-3 Views  Result Date: 02/15/2016 CLINICAL DATA:  Lumbar fusion. EXAM: DG C-ARM 61-120 MIN; LUMBAR SPINE - 2-3 VIEW COMPARISON:  12/13/2015 . FINDINGS: Lumbar vertebra difficult to number due to positioning. Lumbar vertebra are numbered with what appears to be the lowest segmented lumbar shaped vertebra as L5. Lower lumbar posterior and lateral fusion. Inter lumbar fusion. Prior laminectomy. Anatomic alignment. Hardware intact . 2 images obtained. 2 minutes 24 seconds fluoroscopy time utilized. IMPRESSION: Postsurgical changes lumbar spine . Electronically Signed   By: Marcello Moores  Register   On: 02/15/2016 15:09   Dg C-arm 61-120 Min  Result Date: 02/15/2016 CLINICAL DATA:  Lumbar fusion. EXAM: DG C-ARM 61-120 MIN; LUMBAR SPINE - 2-3 VIEW COMPARISON:  12/13/2015 . FINDINGS: Lumbar vertebra difficult to number due to positioning. Lumbar vertebra are numbered with what appears to be the lowest segmented lumbar shaped vertebra as L5. Lower lumbar posterior and lateral fusion. Inter lumbar  fusion. Prior laminectomy. Anatomic alignment. Hardware intact . 2 images obtained. 2 minutes 24 seconds fluoroscopy time utilized. IMPRESSION: Postsurgical changes lumbar spine . Electronically Signed   By: Marcello Moores  Register   On: 02/15/2016 15:09    Assessment/Plan: Doing well.  Discharge home.    LOS: 1 day    Peggyann Shoals, MD 02/16/2016, 8:17 AM

## 2016-02-16 NOTE — Evaluation (Signed)
Occupational Therapy Evaluation and Discharge Patient Details Name: Connie Huang MRN: [id number] DOB: 1950/04/09 Today's Date: 02/16/2016    History of Present Illness Pt is a 66 y/o female who presents s/p L3-L4 ALIF on 02/15/16.   Clinical Impression   This 66 yo female admitted and underwent above presents to acute OT with all education completed with pt and family, we will D/C from acute OT.    Follow Up Recommendations  No OT follow up;Supervision/Assistance - 24 hour    Equipment Recommendations  None recommended by OT       Precautions / Restrictions Precautions Precautions: Fall;Back Precaution Booklet Issued: Yes (comment) Precaution Comments: Reviewed handout, and pt was cued for precautions during functional mobility.  Required Braces or Orthoses: Spinal Brace Spinal Brace: Lumbar corset;Applied in sitting position Restrictions Weight Bearing Restrictions: No      Mobility Bed Mobility General bed mobility comments: Pt up in recliner upon my arrival  Transfers Overall transfer level: Needs assistance Equipment used: None Transfers: Sit to/from Stand Sit to Stand: Min guard A                  ADL Overall ADL's : Needs assistance/impaired Eating/Feeding: Independent;Sitting   Grooming: Set up;Supervision/safety;Sitting   Upper Body Bathing: Supervision/ safety;Set up;Sitting   Lower Body Bathing: Moderate assistance (min guard A sit<>stand)   Upper Body Dressing : Supervision/safety;Set up;Sitting   Lower Body Dressing: Moderate assistance (min guard A sit<>stand)   Toilet Transfer: Min guard   Toileting- Clothing Manipulation and Hygiene: Min guard         General ADL Comments: Pt and family educated on using 2 cups for brushing of teeth to avoid bending over sink, using wet wipes for back peri-care; and most efficient sequence for getting dressed               Pertinent Vitals/Pain Pain Assessment: 0-10 Pain Score: 5   Pain Location: Incision site and left thigh at groin Pain Descriptors / Indicators: Operative site guarding;Guarding;Sore Pain Intervention(s): Limited activity within patient's tolerance;Monitored during session     Hand Dominance Right   Extremity/Trunk Assessment Upper Extremity Assessment Upper Extremity Assessment: Overall WFL for tasks assessed     Communication Communication Communication: No difficulties   Cognition Arousal/Alertness: Awake/alert Behavior During Therapy: WFL for tasks assessed/performed Overall Cognitive Status: Within Functional Limits for tasks assessed                                Home Living Family/patient expects to be discharged to:: Private residence Living Arrangements: Alone Available Help at Discharge: Family;Available 24 hours/day (intially) Type of Home: House Home Access: Stairs to enter [employer] of Steps: 2 Entrance Stairs-Rails: Left Home Layout: Two level;Able to live on main level with bedroom/bathroom Alternate Level Stairs-Number of Steps: flight   Bathroom Shower/Tub: Walk-in shower;Curtain   Bathroom Toilet: Standard     Home Equipment: Environmental consultant - 2 wheels;Cane - single point;Wheelchair - manual;Hand held shower head;Grab bars - tub/shower;Toilet riser          Prior Functioning/Environment Level of Independence: Independent                 OT Problem List: Pain;Decreased range of motion;Impaired balance (sitting and/or standing)      OT Goals(Current goals can be found in the care plan section) Acute Rehab OT Goals Patient Stated Goal: Home today  OT Frequency:  End of Session Equipment Utilized During Treatment: Back brace Nurse Communication:  (family helping pt get dressed and then she should be ready to go)  Activity Tolerance: Patient tolerated treatment well Patient left: in chair;with call bell/phone within reach;with family/visitor present   Time:  1004-1026 OT Time Calculation (min): 22 min Charges:  OT General Charges $OT Visit: 1 Procedure OT Evaluation $OT Eval Moderate Complexity: 1 Procedure  Almon Register [id number] 02/16/2016, 3:17 PM

## 2016-02-16 NOTE — Discharge Summary (Signed)
Physician Discharge Summary  Patient ID: Connie Huang MRN: [id number] DOB/AGE: 1950/09/07 66 y.o.  Admit date: 02/15/2016 Discharge date: 02/16/2016  Admission Diagnoses: LUMBAR FORAMINAL STENOSIS with scoliosis, herniated disc, radiculopathy L 34 level    Discharge Diagnoses: LUMBAR FORAMINAL STENOSIS with scoliosis, herniated disc, radiculopathy L 34 level  s/p LEFT LUMBAR THREE-FOUR ANTEROLATERAL LUMBAR INTERBODY FUSION WITH LATERAL PLATE (Left)  Active Problems:   Scoliosis of lumbosacral spine   Discharged Condition: good  Hospital Course: Connie Huang was admitted for surgery with dx foraminal stenosis, HNP, and scoliosis. Following uncomplicated anterolateral decompression and fusion L3-4, she recovered nicely and transferred to [facility] for nursing care and therapies. She has mobilized nicely, noting good pain relief.   Consults: None  Significant Diagnostic Studies: radiology: X-Ray: intra-op  Treatments: surgery: LEFT LUMBAR THREE-FOUR ANTEROLATERAL LUMBAR INTERBODY FUSION WITH LATERAL PLATE (Left)   Discharge Exam: Blood pressure 122/60, pulse 85, temperature 98 F (36.7 C), temperature source Oral, resp. rate 18, height 5\' 7"  (1.702 m), weight 83.5 kg (184 lb 3 oz), SpO2 97 %. Alert, conversant, noting good pain control. Incisions without erythema, swelling, or drainage beneath dermabond & honeycomb drsgs. Good strength BLE.  Disposition: Discharge to home. Rx's for home use to chart. Pt verbalizes understanding of d/c instructions and agrees to call office to schedule 3-4 wk office f/u.      Allergies as of 02/16/2016      Reactions   Zonalon [doxepin Hcl] Rash   Erythromycin Diarrhea, Nausea And Vomiting      Medication List    TAKE these medications   amoxicillin-clavulanate 875-125 MG tablet Commonly known as:  AUGMENTIN Take 1 tablet by mouth 2 (two) times daily.   aspirin 81 MG tablet Take 81 mg by mouth daily.    atorvastatin 10 MG tablet Commonly known as:  LIPITOR Take 10 mg by mouth daily at 6 PM.   Biotin 10000 MCG Tbdp Take 10,000 mcg by mouth daily.   CALCIUM 600+D 600-800 MG-UNIT Tabs Generic drug:  Calcium Carb-Cholecalciferol Take 1 tablet by mouth 2 (two) times daily.   cetirizine 10 MG tablet Commonly known as:  ZYRTEC Take 10 mg by mouth daily as needed for allergies.   fluticasone 50 MCG/ACT nasal spray Commonly known as:  FLONASE Place 2 sprays into both nostrils daily as needed for allergies or rhinitis.   gabapentin 800 MG tablet Commonly known as:  NEURONTIN Take 800 mg by mouth 3 (three) times daily.   Ginkgo 60 MG Tabs Take 60 mg by mouth daily.   HUMALOG 100 UNIT/ML injection Generic drug:  insulin lispro USE UP TO 50 UNITS VIA PUMP PER DAY AS DIRECTED.   losartan 100 MG tablet Commonly known as:  COZAAR Take 100 mg by mouth daily.   methocarbamol 500 MG tablet Commonly known as:  ROBAXIN Take 1 tablet (500 mg total) by mouth every 6 (six) hours as needed for muscle spasms.   multivitamin with minerals Tabs tablet Take 1 tablet by mouth daily.   oxyCODONE-acetaminophen 5-325 MG tablet Commonly known as:  PERCOCET/ROXICET Take 1-2 tablets by mouth every 4 (four) hours as needed for moderate pain.   SM OMEGA-3-6-9 FATTY ACIDS PO Take 1,600 mg by mouth 2 (two) times daily.   SUPER B COMPLEX PO Take 1 tablet by mouth daily.   timolol 0.5 % ophthalmic solution Commonly known as:  BETIMOL Place 1 drop into the left eye daily.   TUSSIONEX PENNKINETIC ER 10-8 MG/5ML Suer Generic drug:  chlorpheniramine-HYDROcodone  Take 5 mLs by mouth every 12 (twelve) hours as needed for cough.   venlafaxine 75 MG tablet Commonly known as:  EFFEXOR Take 75 mg by mouth daily.   vitamin C 1000 MG tablet Take 1,000 mg by mouth daily.   Vitamin D3 5000 units Tabs Take 5,000 Units by mouth daily.        Signed: Peggyann Shoals, MD 02/16/2016, 8:19 AM

## 2016-02-16 NOTE — Progress Notes (Addendum)
Patient alert and oriented, mae's well, voiding adequate amount of urine, swallowing without difficulty, no c/o pain at time of discharge. Patient discharged home with family. Script and discharged instructions given to patient. Patient and family stated understanding of instructions given. Patient has an appointment with Dr.Stern    

## 2016-04-10 DIAGNOSIS — H0259 Other disorders affecting eyelid function: Secondary | ICD-10-CM | POA: Insufficient documentation

## 2016-06-03 DIAGNOSIS — H401134 Primary open-angle glaucoma, bilateral, indeterminate stage: Secondary | ICD-10-CM | POA: Insufficient documentation

## 2016-09-03 DIAGNOSIS — I739 Peripheral vascular disease, unspecified: Secondary | ICD-10-CM | POA: Insufficient documentation

## 2016-09-03 DIAGNOSIS — I872 Venous insufficiency (chronic) (peripheral): Secondary | ICD-10-CM | POA: Insufficient documentation

## 2016-09-30 DIAGNOSIS — M5412 Radiculopathy, cervical region: Secondary | ICD-10-CM | POA: Diagnosis present

## 2017-05-23 DIAGNOSIS — M069 Rheumatoid arthritis, unspecified: Secondary | ICD-10-CM | POA: Diagnosis present

## 2017-12-23 DIAGNOSIS — Z794 Long term (current) use of insulin: Secondary | ICD-10-CM

## 2017-12-23 DIAGNOSIS — G902 Horner's syndrome: Secondary | ICD-10-CM | POA: Insufficient documentation

## 2017-12-23 HISTORY — DX: Horner's syndrome: G90.2

## 2017-12-30 IMAGING — RF DG LUMBAR SPINE 2-3V
1 series · 2 of 2 positions shown · non-contrast
Comparison: 12/13/2015 .

CLINICAL DATA: Lumbar fusion.

EXAM:
DG C-ARM 61-120 MIN; LUMBAR SPINE - 2-3 VIEW

[Series 1: run · 2 of 2 slices shown]
[im 1/2]
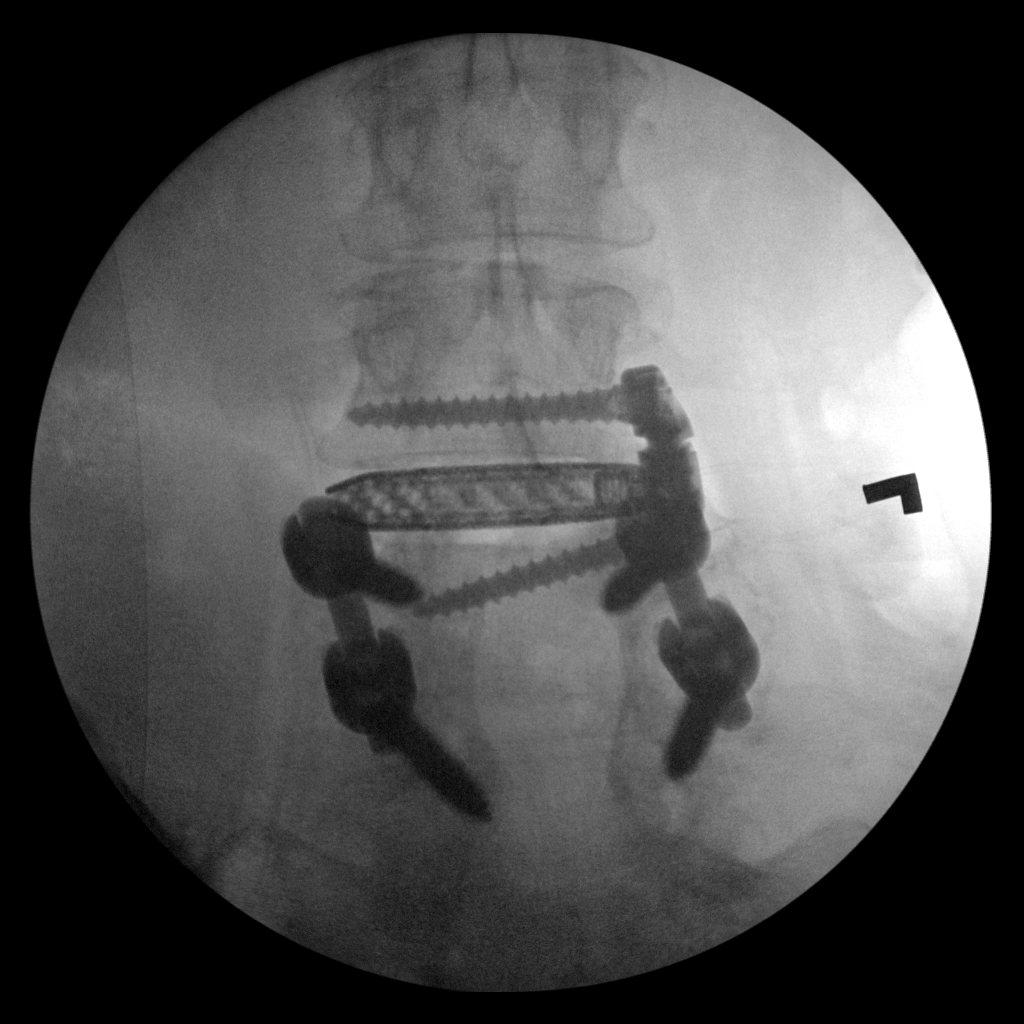
[im 2/2]
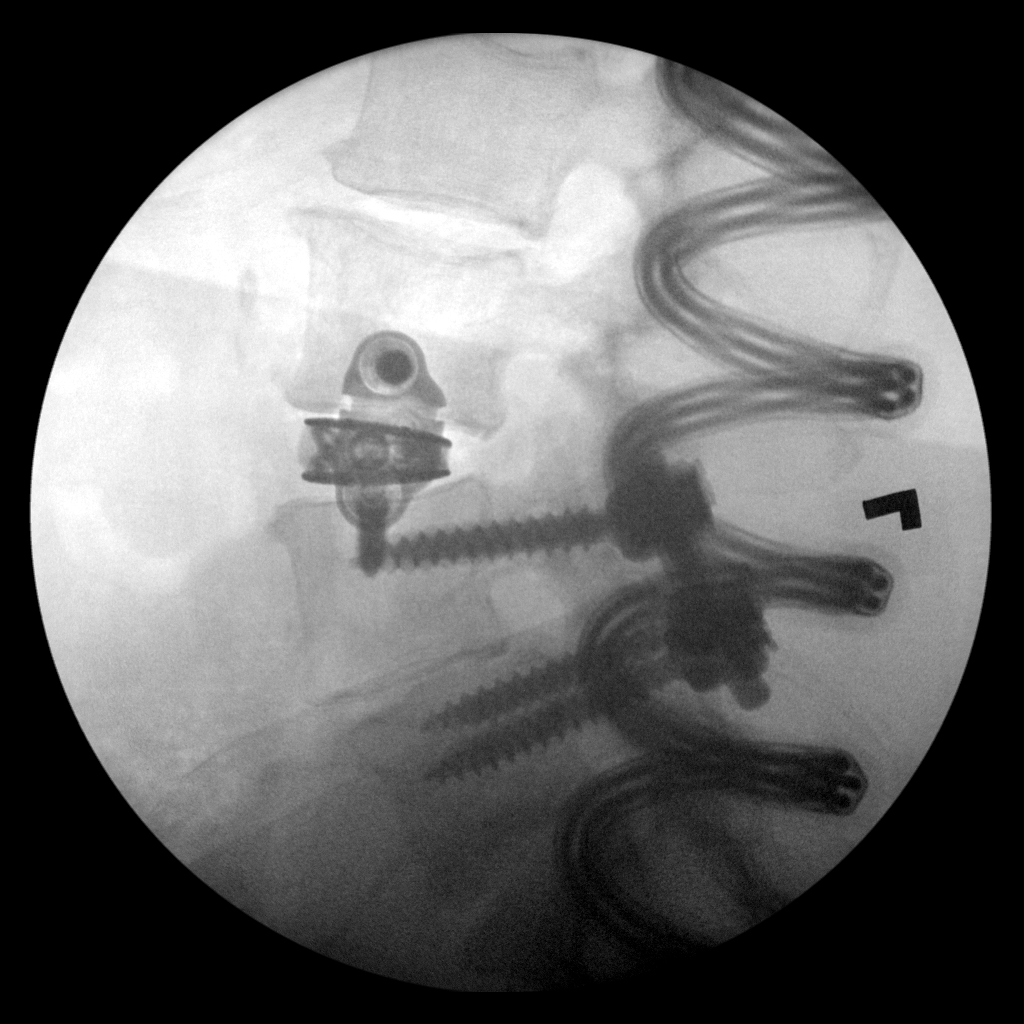

[2 of 2 positions shown; findings below may reference images not displayed]

FINDINGS: Lumbar vertebra difficult to number due to positioning. Lumbar
vertebra are numbered with what appears to be the lowest segmented
lumbar shaped vertebra as L5. Lower lumbar posterior and lateral
fusion. Inter lumbar fusion. Prior laminectomy. Anatomic alignment.
Hardware intact . 2 images obtained. 2 minutes 24 seconds
fluoroscopy time utilized.
IMPRESSION: Postsurgical changes lumbar spine .

## 2018-03-17 DIAGNOSIS — E782 Mixed hyperlipidemia: Secondary | ICD-10-CM | POA: Diagnosis present

## 2018-03-17 DIAGNOSIS — I119 Hypertensive heart disease without heart failure: Secondary | ICD-10-CM | POA: Diagnosis present

## 2020-04-04 DIAGNOSIS — E1042 Type 1 diabetes mellitus with diabetic polyneuropathy: Secondary | ICD-10-CM | POA: Diagnosis present

## 2020-04-05 LAB — LIPID PANEL
LDL Cholesterol: 80
Triglycerides: 71 (ref 40–160)

## 2020-04-05 LAB — HEMOGLOBIN A1C: Hemoglobin A1C: 6.9

## 2020-04-05 LAB — COMPREHENSIVE METABOLIC PANEL: GFR calc non Af Amer: 87

## 2020-04-05 LAB — BASIC METABOLIC PANEL: Glucose: 118

## 2020-07-06 LAB — MICROALBUMIN, URINE: Microalb, Ur: 10

## 2020-10-07 LAB — LIPID PANEL
LDL Cholesterol: 85
Triglycerides: 67 (ref 40–160)

## 2020-10-07 LAB — HEMOGLOBIN A1C: Hemoglobin A1C: 7.5

## 2020-10-07 LAB — COMPREHENSIVE METABOLIC PANEL: GFR calc non Af Amer: 72

## 2020-10-07 LAB — BASIC METABOLIC PANEL: Glucose: 223

## 2020-10-17 ENCOUNTER — Ambulatory Visit (INDEPENDENT_AMBULATORY_CARE_PROVIDER_SITE_OTHER): Payer: Medicare Other | Admitting: Nurse Practitioner

## 2020-10-17 ENCOUNTER — Encounter: Payer: Self-pay | Admitting: Nurse Practitioner

## 2020-10-17 ENCOUNTER — Other Ambulatory Visit: Payer: Self-pay

## 2020-10-17 VITALS — BP 107/65 | HR 71 | Ht 65.25 in | Wt 174.2 lb

## 2020-10-17 DIAGNOSIS — I10 Essential (primary) hypertension: Secondary | ICD-10-CM | POA: Diagnosis not present

## 2020-10-17 DIAGNOSIS — E109 Type 1 diabetes mellitus without complications: Secondary | ICD-10-CM | POA: Diagnosis not present

## 2020-10-17 DIAGNOSIS — E782 Mixed hyperlipidemia: Secondary | ICD-10-CM
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
## 2020-10-17 NOTE — Patient Instructions (Signed)
Diabetes Mellitus and Nutrition, Adult When you have diabetes, or diabetes mellitus, it is very important to have healthy eating habits because your blood sugar (glucose) levels are greatly affected by what you eat and drink. Eating healthy foods in the right amounts, at about the same times every day, can help you:  Control your blood glucose.  Lower your risk of heart disease.  Improve your blood pressure.  Reach or maintain a healthy weight. What can affect my meal plan? Every person with diabetes is different, and each person has different needs for a meal plan. Your health care provider may recommend that you work with a dietitian to make a meal plan that is best for you. Your meal plan may vary depending on factors such as:  The calories you need.  The medicines you take.  Your weight.  Your blood glucose, blood pressure, and cholesterol levels.  Your activity level.  Other health conditions you have, such as heart or kidney disease. How do carbohydrates affect me? Carbohydrates, also called carbs, affect your blood glucose level more than any other type of food. Eating carbs naturally raises the amount of glucose in your blood. Carb counting is a method for keeping track of how many carbs you eat. Counting carbs is important to keep your blood glucose at a healthy level, especially if you use insulin or take certain oral diabetes medicines. It is important to know how many carbs you can safely have in each meal. This is different for every person. Your dietitian can help you calculate how many carbs you should have at each meal and for each snack. How does alcohol affect me? Alcohol can cause a sudden decrease in blood glucose (hypoglycemia), especially if you use insulin or take certain oral diabetes medicines. Hypoglycemia can be a life-threatening condition. Symptoms of hypoglycemia, such as sleepiness, dizziness, and confusion, are similar to symptoms of having too much  alcohol.  Do not drink alcohol if: ? Your health care provider tells you not to drink. ? You are pregnant, may be pregnant, or are planning to become pregnant.  If you drink alcohol: ? Do not drink on an empty stomach. ? Limit how much you use to:  0-1 drink a day for women.  0-2 drinks a day for men. ? Be aware of how much alcohol is in your drink. In the [place] one drink equals one 12 oz bottle of beer (355 mL), one 5 oz glass of wine (148 mL), or one 1 oz glass of hard liquor (44 mL). ? Keep yourself hydrated with water, diet soda, or unsweetened iced tea.  Keep in mind that regular soda, juice, and other mixers may contain a lot of sugar and must be counted as carbs. What are tips for following this plan? Reading food labels  Start by checking the serving size on the "Nutrition Facts" label of packaged foods and drinks. The amount of calories, carbs, fats, and other nutrients listed on the label is based on one serving of the item. Many items contain more than one serving per package.  Check the total grams (g) of carbs in one serving. You can calculate the number of servings of carbs in one serving by dividing the total carbs by 15. For example, if a food has 30 g of total carbs per serving, it would be equal to 2 servings of carbs.  Check the number of grams (g) of saturated fats and trans fats in one serving. Choose foods that have   a low amount or none of these fats.  Check the number of milligrams (mg) of salt (sodium) in one serving. Most people should limit total sodium intake to less than 2,300 mg per day.  Always check the nutrition information of foods labeled as "low-fat" or "nonfat." These foods may be higher in added sugar or refined carbs and should be avoided.  Talk to your dietitian to identify your daily goals for nutrients listed on the label. Shopping  Avoid buying canned, pre-made, or processed foods. These foods tend to be high in fat, sodium, and added  sugar.  Shop around the outside edge of the grocery store. This is where you will most often find fresh fruits and vegetables, bulk grains, fresh meats, and fresh dairy. Cooking  Use low-heat cooking methods, such as baking, instead of high-heat cooking methods like deep frying.  Cook using healthy oils, such as olive, canola, or sunflower oil.  Avoid cooking with butter, cream, or high-fat meats. Meal planning  Eat meals and snacks regularly, preferably at the same times every day. Avoid going long periods of time without eating.  Eat foods that are high in fiber, such as fresh fruits, vegetables, beans, and whole grains. Talk with your dietitian about how many servings of carbs you can eat at each meal.  Eat 4-6 oz (112-168 g) of lean protein each day, such as lean meat, chicken, fish, eggs, or tofu. One ounce (oz) of lean protein is equal to: ? 1 oz (28 g) of meat, chicken, or fish. ? 1 egg. ?  cup (62 g) of tofu.  Eat some foods each day that contain healthy fats, such as avocado, nuts, seeds, and fish.   What foods should I eat? Fruits Berries. Apples. Oranges. Peaches. Apricots. Plums. Grapes. Mango. Papaya. Pomegranate. Kiwi. Cherries. Vegetables Lettuce. Spinach. Leafy greens, including kale, chard, collard greens, and mustard greens. Beets. Cauliflower. Cabbage. Broccoli. Carrots. Green beans. Tomatoes. Peppers. Onions. Cucumbers. Brussels sprouts. Grains Whole grains, such as whole-wheat or whole-grain bread, crackers, tortillas, cereal, and pasta. Unsweetened oatmeal. Quinoa. Brown or wild rice. Meats and other proteins Seafood. Poultry without skin. Lean cuts of poultry and beef. Tofu. Nuts. Seeds. Dairy Low-fat or fat-free dairy products such as milk, yogurt, and cheese. The items listed above may not be a complete list of foods and beverages you can eat. Contact a dietitian for more information. What foods should I avoid? Fruits Fruits canned with  syrup. Vegetables Canned vegetables. Frozen vegetables with butter or cream sauce. Grains Refined white flour and flour products such as bread, pasta, snack foods, and cereals. Avoid all processed foods. Meats and other proteins Fatty cuts of meat. Poultry with skin. Breaded or fried meats. Processed meat. Avoid saturated fats. Dairy Full-fat yogurt, cheese, or milk. Beverages Sweetened drinks, such as soda or iced tea. The items listed above may not be a complete list of foods and beverages you should avoid. Contact a dietitian for more information. Questions to ask a health care provider  Do I need to meet with a diabetes educator?  Do I need to meet with a dietitian?  What number can I call if I have questions?  When are the best times to check my blood glucose? Where to find more information:  American Diabetes Association: diabetes.org  Academy of Nutrition and Dietetics: www.eatright.org  National Institute of Diabetes and Digestive and Kidney Diseases: www.niddk.nih.gov  Association of Diabetes Care and Education Specialists: www.diabeteseducator.org Summary  It is important to have healthy eating   habits because your blood sugar (glucose) levels are greatly affected by what you eat and drink.  A healthy meal plan will help you control your blood glucose and maintain a healthy lifestyle.  Your health care provider may recommend that you work with a dietitian to make a meal plan that is best for you.  Keep in mind that carbohydrates (carbs) and alcohol have immediate effects on your blood glucose levels. It is important to count carbs and to use alcohol carefully. This information is not intended to replace advice given to you by your health care provider. Make sure you discuss any questions you have with your health care provider. Document Revised: 12/29/2018 Document Reviewed: 12/29/2018 Elsevier Patient Education  2021 Elsevier Inc.  

## 2020-10-24 ENCOUNTER — Encounter: Payer: Self-pay | Admitting: Nurse Practitioner

## 2020-10-30 NOTE — Patient Instructions (Signed)

## 2020-10-31 ENCOUNTER — Other Ambulatory Visit: Payer: Self-pay

## 2020-10-31 ENCOUNTER — Encounter: Payer: Self-pay | Admitting: Nurse Practitioner

## 2020-10-31 ENCOUNTER — Ambulatory Visit: Payer: Medicare Other | Admitting: Nurse Practitioner

## 2020-10-31 ENCOUNTER — Ambulatory Visit (INDEPENDENT_AMBULATORY_CARE_PROVIDER_SITE_OTHER): Payer: Medicare Other | Admitting: Nurse Practitioner

## 2020-10-31 VITALS — BP 130/52 | HR 74 | Ht 65.25 in | Wt 175.4 lb

## 2020-10-31 DIAGNOSIS — I10 Essential (primary) hypertension: Secondary | ICD-10-CM | POA: Diagnosis not present

## 2020-10-31 DIAGNOSIS — E109 Type 1 diabetes mellitus without complications: Secondary | ICD-10-CM

## 2020-10-31 DIAGNOSIS — E782 Mixed hyperlipidemia: Secondary | ICD-10-CM | POA: Diagnosis not present

## 2020-10-31 NOTE — Progress Notes (Signed)
Endocrinology Follow Up Note       10/31/2020, 4:14 PM   Subjective:    Patient ID: Connie Huang, female    DOB: 1950/09/16.  Connie Huang is being seen in follow up after being seen in consultation for management of currently uncontrolled symptomatic diabetes requested by  Michell Heinrich, DO.   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   Complication of anesthesia 1973   woke up during Tonsillectomy   Depression    Diabetes mellitus without complication ([place])    Type I   Glaucoma    Hyperlipidemia    Hyponatremia 05/2015   Lumbar stenosis    Pneumonia 05/2015   PONV (postoperative nausea and vomiting)     Past Surgical History:  Procedure Laterality Date   ANTERIOR LAT LUMBAR FUSION Left 02/15/2016   Procedure: LEFT LUMBAR THREE-FOUR ANTEROLATERAL LUMBAR INTERBODY FUSION WITH LATERAL PLATE;  Surgeon: Erline Levine, MD;  Location: [facility];  Service: Neurosurgery;  Laterality: Left;   BACK SURGERY     Lumbar 4 and 5 Fusion   COLONOSCOPY     TONSILLECTOMY      Social History   Socioeconomic History   Marital status: Unknown    Spouse name: Not on file   Number of children: Not on file   Years of education: Not on file   Highest education level: Not on file  Occupational History   Not on file  Tobacco Use   Smoking status: Former    Years: 20.00    Types: Cigarettes    Quit date: 10/15/1997    Years since quitting: 23.0   Smokeless tobacco: Never  Vaping Use   Vaping Use: Never used  Substance and Sexual Activity   Alcohol use: No   Drug use: No   Sexual activity: Not on file  Other Topics Concern   Not on file  Social History Narrative   Not on file   Social Determinants of Health   Financial Resource  Strain: Not on file  Food Insecurity: Not on file  Transportation Needs: Not on file  Physical Activity: Not on file  Stress: Not on file  Social Connections: Not on file    Family History  Problem Relation Age of Onset   Hypertension Mother    Hyperlipidemia Mother    Dementia Mother    Hyperlipidemia Father    Hypertension Father    Stroke Father    Heart attack Father     Outpatient Encounter Medications as of 10/31/2020  Medication Sig   Accu-Chek FastClix Lancets MISC Accu-Chek Fastclix Lancet Drum  USE AS DIRECTED   Ascorbic Acid (VITAMIN C) 1000 MG tablet Take 1,000 mg by mouth daily.   aspirin 81 MG tablet Take 81 mg by mouth daily.   atorvastatin (LIPITOR) 10 MG tablet Take 10 mg by mouth daily at 6 PM.   B Complex-C (SUPER B COMPLEX PO) Take 1 tablet by mouth daily.  Calcium Carb-Cholecalciferol 600-800 MG-UNIT TABS Take 1 tablet by mouth 2 (two) times daily.   cetirizine (ZYRTEC) 10 MG tablet Take 10 mg by mouth daily as needed for allergies.   Cholecalciferol (VITAMIN D3) 5000 units TABS Take 5,000 Units by mouth daily.   Continuous Blood Gluc Receiver (DEXCOM G6 RECEIVER) DEVI Dexcom G6 Receiver misc  USE AS DIRECTED   Continuous Blood Gluc Sensor (DEXCOM G6 SENSOR) MISC See admin instructions.   Continuous Blood Gluc Transmit (DEXCOM G6 TRANSMITTER) MISC See admin instructions.   glucose blood (ACCU-CHEK GUIDE) test strip Accu-Chek Guide test strips  USE AS DIRECTED TO CHECK BLOOD GLUCOSE FOUR TIMES A DAY   losartan (COZAAR) 100 MG tablet Take 100 mg by mouth daily.   MAGNESIUM PO Take 1 capsule by mouth daily.   Multiple Vitamin (MULTIVITAMIN WITH MINERALS) TABS tablet Take 1 tablet by mouth daily.   SM OMEGA-3-6-9 FATTY ACIDS PO Take 1,600 mg by mouth 2 (two) times daily.   timolol (BETIMOL) 0.5 % ophthalmic solution Place 1 drop into the left eye daily.   triamcinolone cream (KENALOG) 0.1 % triamcinolone acetonide 0.1 % topical cream   venlafaxine (EFFEXOR)  75 MG tablet Take 75 mg by mouth daily.   No facility-administered encounter medications on file as of 10/31/2020.    ALLERGIES: Allergies  Allergen Reactions   Zonalon [Doxepin Hcl] Rash   Alcohol    Erythromycin Diarrhea, Nausea And Vomiting and Other (See Comments)   Erythromycin Base    Zonalon [Doxepin] Rash    VACCINATION STATUS:  There is no immunization history on file for this patient.  Diabetes She presents for her follow-up diabetic visit. She has type 1 diabetes mellitus. Onset time: Diagnosed at approx age of 1. Her disease course has been stable. Hypoglycemia symptoms include nervousness/anxiousness, sleepiness, sweats and tremors. There are no hypoglycemic complications. (About 1 year ago (May 2021), she had hypoglycemic event where she was pulling into a parking space and she blacked out and required attention at the ED.) Symptoms are stable. Diabetic complications include nephropathy, peripheral neuropathy, PVD and retinopathy. Risk factors for coronary artery disease include diabetes mellitus, hypertension, dyslipidemia, post-menopausal and sedentary lifestyle. Current diabetic treatment includes insulin pump. She is compliant with treatment all of the time. Her weight is stable. She is following a generally healthy diet. Meal planning includes avoidance of concentrated sweets and ADA exchanges. She has not had a previous visit with a dietitian. She participates in exercise intermittently. Her home blood glucose trend is fluctuating minimally. Her overall blood glucose range is 140-180 mg/dl. (She presents today with her insulin pump and logs showing stable glycemic profile overall.  She has had 2 episodes of low glucose in the afternoon but none severe.  She was not due for another A1c today.  Her Dexcom has been acting up so she has been performing fingersticks as a back up.  She did receive approval from the [facility] for her to keep her license.) An ACE inhibitor/angiotensin II  receptor blocker is being taken. She does not see a podiatrist.Eye exam is current.  Hypertension This is a chronic problem. The current episode started more than 1 year ago. The problem has been resolved since onset. The problem is controlled. Associated symptoms include sweats. There are no associated agents to hypertension. Risk factors for coronary artery disease include diabetes mellitus, dyslipidemia, family history and post-menopausal state. Past treatments include angiotensin blockers. The current treatment provides significant improvement. There are no compliance problems.  Hypertensive end-organ  damage includes kidney disease, PVD and retinopathy. Identifiable causes of hypertension include chronic renal disease.  Hyperlipidemia This is a chronic problem. The current episode started more than 1 year ago. The problem is controlled. Recent lipid tests were reviewed and are normal. Exacerbating diseases include chronic renal disease and diabetes. There are no known factors aggravating her hyperlipidemia. Current antihyperlipidemic treatment includes statins. The current treatment provides moderate improvement of lipids. There are no compliance problems.  Risk factors for coronary artery disease include diabetes mellitus, dyslipidemia, family history, hypertension and post-menopausal.    Review of systems  Constitutional: + Minimally fluctuating body weight, current Body mass index is 28.96 kg/m., no fatigue, no subjective hyperthermia, no subjective hypothermia Eyes: no blurry vision, no xerophthalmia ENT: no sore throat, no nodules palpated in throat, no dysphagia/odynophagia, no hoarseness Cardiovascular: no chest pain, no shortness of breath, no palpitations, no leg swelling Respiratory: no cough, no shortness of breath Gastrointestinal: no nausea/vomiting/diarrhea Musculoskeletal: no muscle/joint aches Skin: no rashes, no hyperemia Neurological: no tremors, no numbness, no tingling, no  dizziness Psychiatric: no depression, no anxiety  Objective:     BP (!) 130/52   Pulse 74   Ht 5' 5.25" (1.657 m)   Wt 175 lb 6.4 oz (79.6 kg)   BMI 28.96 kg/m   Wt Readings from Last 3 Encounters:  10/31/20 175 lb 6.4 oz (79.6 kg)  10/17/20 174 lb 3.2 oz (79 kg)  02/15/16 184 lb 3 oz (83.5 kg)     BP Readings from Last 3 Encounters:  10/31/20 (!) 130/52  10/17/20 107/65  02/16/16 (!) 116/49     Physical Exam- Limited  Constitutional:  Body mass index is 28.96 kg/m. , not in acute distress, normal state of mind Eyes:  EOMI, no exophthalmos Neck: Supple Cardiovascular: RRR, no murmurs, rubs, or gallops, no edema Respiratory: Adequate breathing efforts, no crackles, rales, rhonchi, or wheezing Musculoskeletal: no gross deformities, strength intact in all four extremities, no gross restriction of joint movements Skin:  no rashes, no hyperemia Neurological: no tremor with outstretched hands   POCT ABI Results 10/31/20   Right ABI:  1.12      Left ABI:  1.13  Right leg systolic / diastolic: 606/30 mmHg Left leg systolic / diastolic: 160/10 mmHg  Arm systolic / diastolic: 932/35 mmHG  Detailed report will be scanned into patient chart.  CMP ( most recent) CMP     Component Value Date/Time   NA 135 02/13/2016 1419   K 4.0 02/13/2016 1419   CL 101 02/13/2016 1419   CO2 28 02/13/2016 1419   GLUCOSE 116 (H) 02/13/2016 1419   BUN 13 02/13/2016 1419   CREATININE 0.79 02/13/2016 1419   CALCIUM 9.5 02/13/2016 1419   GFRNONAA 72 10/06/2020 0000   GFRAA >60 02/13/2016 1419     Diabetic Labs (most recent): Lab Results  Component Value Date   HGBA1C 7.5 10/06/2020   HGBA1C 6.9 04/04/2020     Lipid Panel ( most recent) Lipid Panel     Component Value Date/Time   TRIG 67 10/06/2020 0000   LDLCALC 85 10/06/2020 0000      No results found for: TSH, FREET4         Assessment & Plan:   1) Type 1 diabetes mellitus without complication ([place])  She  presents today with her insulin pump and logs showing stable glycemic profile overall.  She has had 2 episodes of low glucose in the afternoon but none severe.  She was not  due for another A1c today.  Her Dexcom has been acting up so she has been performing fingersticks as a back up.  She did receive approval from the [facility] for her to keep her license.  Connie Huang has currently uncontrolled symptomatic type 2 DM since 70 years of age, with most recent A1c of 7.5 %.   -Recent labs reviewed.  - I had a long discussion with her about the progressive nature of diabetes and the pathology behind its complications. -her diabetes is complicated by retinopathy, PVD, mild CKD and she remains at a high risk for more acute and chronic complications which include CAD, CVA, CKD, retinopathy, and neuropathy. These are all discussed in detail with her.  - Nutritional counseling repeated at each appointment due to patients tendency to fall back in to old habits.  - The patient admits there is a room for improvement in their diet and drink choices. -  Suggestion is made for the patient to avoid simple carbohydrates from their diet including Cakes, Sweet Desserts / Pastries, Ice Cream, Soda (diet and regular), Sweet Tea, Candies, Chips, Cookies, Sweet Pastries, Store Bought Juices, Alcohol in Excess of 1-2 drinks a day, Artificial Sweeteners, Coffee Creamer, and "Sugar-free" Products. This will help patient to have stable blood glucose profile and potentially avoid unintended weight gain.   - I encouraged the patient to switch to unprocessed or minimally processed complex starch and increased protein intake (animal or plant source), fruits, and vegetables.   - Patient is advised to stick to a routine mealtimes to eat 3 meals a day and avoid unnecessary snacks (to snack only to correct hypoglycemia).  - I have approached her with the following individualized plan to manage her diabetes and patient agrees:    -For safety purposes, avoiding hypoglycemia is the number 1 priority in her case.  I discussed with her that an acceptable target A1c for her would be between 7-7.5%.  -Based on her glucose readings, only slight changes will be made to her insulin pump today.  She is advised to continue the Basal rate of 0.55 units/hr with active run time of 4 hrs and keep same insulin sensitivity factor of 50.  Will increase her insulin carb ratio to 1:10 and change her Target BG to 100-150 for safety purposes.  -she is encouraged to continue monitoring glucose 4 times daily, before meals and before bed, and to call the clinic if she has readings less than 70 or above 200 for 3 tests in a row.  - she is warned not to take insulin without proper monitoring per orders. - Adjustment parameters are given to her for hypo and hyperglycemia in writing.  -Given her type 1 diabetes diagnosis, insulin is the only choice for treatment of her diabetes.  - Specific targets for  A1c; LDL, HDL, and Triglycerides were discussed with the patient.  2) Blood Pressure /Hypertension:  her blood pressure is controlled to target.   she is advised to continue her current medications including Losartan 100 mg p.o. daily with breakfast.  3) Lipids/Hyperlipidemia:    Review of her recent lipid panel from 04/05/20 showed controlled LDL at 80 .  she is advised to continue Lipitor 10 mg daily at bedtime.  Side effects and precautions discussed with her.  4)  Weight/Diet:  her Body mass index is 28.96 kg/m.  -   she is not a candidate for weight loss. Exercise, and detailed carbohydrates information provided  -  detailed on discharge  instructions.  5) Chronic Care/Health Maintenance: -she is on ACEI/ARB and Statin medications and is encouraged to initiate and continue to follow up with Ophthalmology, Dentist, Podiatrist at least yearly or according to recommendations, and advised to stay away from smoking. I have recommended yearly flu  vaccine and pneumonia vaccine at least every 5 years; moderate intensity exercise for up to 150 minutes weekly; and sleep for at least 7 hours a day.  - she is advised to maintain close follow up with Michell Heinrich, DO for primary care needs, as well as her other providers for optimal and coordinated care.     I spent 44 minutes in the care of the patient today including review of labs from [place], Lipids, Thyroid Function, Hematology (current and previous including abstractions from other facilities); face-to-face time discussing  her blood glucose readings/logs, discussing hypoglycemia and hyperglycemia episodes and symptoms, medications doses, her options of short and long term treatment based on the latest standards of care / guidelines;  discussion about incorporating lifestyle medicine;  and documenting the encounter.    Please refer to Patient Instructions for Blood Glucose Monitoring and Insulin/Medications Dosing Guide"  in media tab for additional information. Please  also refer to " Patient Self Inventory" in the Media  tab for reviewed elements of pertinent patient history.  Connie Huang participated in the discussions, expressed understanding, and voiced agreement with the above plans.  All questions were answered to her satisfaction. she is encouraged to contact clinic should she have any questions or concerns prior to her return visit.   Follow up plan: - Return in about 3 months (around 01/30/2021) for Diabetes F/U with A1c in office, No previsit labs, Bring meter and logs.    Rayetta Pigg, [facility] [address] [place], [place] [address] Phone: [phone] Fax: [phone]  10/31/2020, 4:14 PM

## 2020-11-01 ENCOUNTER — Ambulatory Visit: Payer: Medicare Other | Admitting: Nurse Practitioner

## 2020-11-14 ENCOUNTER — Telehealth: Payer: Self-pay

## 2020-11-14 NOTE — Telephone Encounter (Signed)
Can you give me the highest amount of insulin this patient may use in a day with her pump? Including bolus and basal rates?

## 2020-11-14 NOTE — Telephone Encounter (Signed)
I pulled up the report from when we downloaded her pump last and it showed she was taking around 37 units per day total (basal and bolus).

## 2020-11-14 NOTE — Telephone Encounter (Signed)
Edison Nasuti with [facility] is requesting a call back in regards to her insulin. He said that medicare is requiring a lot more information. He can be reached at [phone]

## 2020-11-14 NOTE — Telephone Encounter (Signed)
I called the pharmacy and the lady I spoke with stated that they had taken care of everything and was on the phone with her insurance at the time. They thanked me for calling.

## 2021-02-04 HISTORY — PX: CATARACT EXTRACTION: SUR2

## 2021-02-06 ENCOUNTER — Ambulatory Visit: Payer: Medicare Other | Admitting: Nurse Practitioner

## 2021-02-12 ENCOUNTER — Telehealth: Payer: Self-pay

## 2021-02-12 NOTE — Telephone Encounter (Signed)
Patient called and stated that she spoke with Whitney about some GI issues she was having and the patient does not want to go to the [facility] and wanted to know if you remembered her conversation with you and if you could refer her to [place]?

## 2021-02-13 NOTE — Telephone Encounter (Signed)
I'm sorry, I do not remember any conversation about GI problems, granted it has been several months since I last saw her.  She would need to reach out to her PCP to get a referral to a different GI doc.

## 2021-02-13 NOTE — Telephone Encounter (Signed)
Called the patient and gave her the message and let her know to reach out to her primary care provider to send a referral to [facility] for her. Patient verbalized an understanding and will contact them. Patient stated that she does have an appointment on 02/20/2021 at [facility] for an evaluation.

## 2021-02-20 ENCOUNTER — Ambulatory Visit (INDEPENDENT_AMBULATORY_CARE_PROVIDER_SITE_OTHER): Payer: Medicare Other | Admitting: Gastroenterology

## 2021-02-20 ENCOUNTER — Encounter: Payer: Self-pay | Admitting: Gastroenterology

## 2021-02-20 VITALS — BP 120/70 | HR 67 | Ht 65.0 in | Wt 164.4 lb

## 2021-02-20 DIAGNOSIS — R195 Other fecal abnormalities: Secondary | ICD-10-CM

## 2021-02-20 DIAGNOSIS — R194 Change in bowel habit: Secondary | ICD-10-CM

## 2021-02-20 NOTE — Patient Instructions (Signed)

## 2021-02-20 NOTE — Patient Instructions (Addendum)
It was my pleasure to provide care to you today. Based on our discussion, I am providing you with my recommendations below:  RECOMMENDATION(S):   I have recommended a colonoscopy for further evaluation.  In the meantime, please start using a daily dose of Metumucil, Benefiber, or Citrucel.   COLONOSCOPY:   You have been scheduled for a colonoscopy. Please follow written instructions given to you at your visit today.   PREP:   Please pick up your prep supplies at the pharmacy within the next 1-3 days.  INHALERS:   If you use inhalers (even only as needed), please bring them with you on the day of your procedure.  COLONOSCOPY TIPS:  To reduce nausea and dehydration, stay well hydrated for 3-4 days prior to the exam.  To prevent skin/hemorrhoid irritation - prior to wiping, put A&Dointment or vaseline on the toilet paper. Keep a towel or pad on the bed.  BEFORE STARTING YOUR PREP, drink  64oz of clear liquids in the morning. This will help to flush the colon and will ensure you are well hydrated!!!!  NOTE - This is in addition to the fluids required for to complete your prep. Use of a flavored hard candy, such as grape Anise Salvo, can counteract some of the flavor of the prep and may prevent some nausea.    FOLLOW UP:  After your procedure, you will receive a call from my office staff regarding my recommendation for follow up.  BMI:  If you are age 65 or older, your body mass index should be between 23-30. Your Body mass index is 27.36 kg/m. If this is out of the aforementioned range listed, please consider follow up with your Primary Care Provider.  If you are age 62 or younger, your body mass index should be between 19-25. Your Body mass index is 27.36 kg/m. If this is out of the aformentioned range listed, please consider follow up with your Primary Care Provider.   MY CHART:  The [facility] providers would like to encourage you to use [facility] to communicate with  providers for non-urgent requests or questions.  Due to long hold times on the telephone, sending your provider a message by [facility] may be a faster and more efficient way to get a response.  Please allow 48 business hours for a response.  Please remember that this is for non-urgent requests.   Thank you for trusting me with your gastrointestinal care!    Thornton Park, MD, MPH

## 2021-02-20 NOTE — Progress Notes (Signed)
Referring Provider: Michell Heinrich, DO Primary Care Physician:  Elby Beck, DO   Reason for Consultation:  Change in bowel habits   IMPRESSION:  Change in bowel habits with mucous in the stool and rectal pressure  Differential: Infection, IBD, bacterial overgrowth, excessive wiping leading to anal gland leakage, food allergy, segmental colitis associated with diverticulosis, and mucous secreting appendiceal tumor. Given this differential, a colonoscopy is recommended.    PLAN: - Add a daily dose of Metamucil - Colonoscopy reports from [facility] in 2005 and [facility] in [place] 2015 - Colonoscopy with random colon biopsies  HPI: Connie Huang is a 71 y.o. female who is self-referred for a "rectal protrusion." The history is obtained through the patient and review of her electronic health record. She is unaccompanied.   Concerned because she is feeling a fullness at her rectum.  It is uncomfortable but there is no rectal pain, itching, burning.  There is an associated mucous discharge requiring her to wear a pad. Mucous described as clear or brown slime.   Having a bowel movement daily to every other day, as that has not changed with recent mucous. Denies constipation or diarrhea. No straining or sense of incomplete evacuation.   Started while on a family vacation in [place] and [place].   She hasn't tried anything because she doesn't know what to try. She has mentioned it to a couple of doctors who didn't give her helpful advice.   Prior colonoscopy in [place] in 2005 and at [facility] 2015. She thinks the procedure were normal.   There is no known family history of colon cancer or polyps. No family history of stomach cancer or other GI malignancy. No family history of inflammatory bowel disease or celiac.    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   Glaucoma    Hyperlipidemia    Hyponatremia 05/2015   Lumbar stenosis    Pneumonia 05/2015   PONV (postoperative nausea and vomiting)     Past Surgical History:  Procedure Laterality Date   ANTERIOR LAT LUMBAR FUSION Left 02/15/2016   Procedure: LEFT LUMBAR THREE-FOUR ANTEROLATERAL LUMBAR INTERBODY FUSION WITH LATERAL PLATE;  Surgeon: Erline Levine, MD;  Location: [place];  Service: Neurosurgery;  Laterality: Left;   BACK SURGERY     Lumbar 4 and 5 Fusion   COLONOSCOPY  2015   last colonoscopy was done in [place]   TONSILLECTOMY      Current Outpatient Medications  Medication Sig Dispense Refill   Accu-Chek FastClix Lancets MISC Accu-Chek Fastclix Lancet Drum  USE AS DIRECTED     Ascorbic Acid (VITAMIN C) 1000 MG tablet Take 1,000 mg by mouth daily.     aspirin 81 MG tablet Take 81 mg by mouth daily.     atorvastatin (LIPITOR) 10 MG tablet Take 10 mg by mouth daily at 6 PM.     B Complex-C (SUPER B COMPLEX PO) Take 1 tablet by mouth daily.     Calcium Carb-Cholecalciferol 600-800 MG-UNIT TABS Take 1 tablet by mouth 2 (two) times daily.     cetirizine (  ZYRTEC) 10 MG tablet Take 10 mg by mouth daily as needed for allergies.     Cholecalciferol (VITAMIN D3) 5000 units TABS Take 5,000 Units by mouth daily.     Continuous Blood Gluc Receiver (DEXCOM G6 RECEIVER) DEVI Dexcom G6 Receiver misc  USE AS DIRECTED     Continuous Blood Gluc Sensor (DEXCOM G6 SENSOR) MISC See admin instructions.     Continuous Blood Gluc Transmit (DEXCOM G6 TRANSMITTER) MISC See admin instructions.     glucose blood (ACCU-CHEK GUIDE) test strip Accu-Chek Guide test strips  USE AS DIRECTED TO CHECK BLOOD GLUCOSE FOUR TIMES A DAY     Insulin Human (INSULIN PUMP) SOLN Inject into the skin. As directed      losartan (COZAAR) 100 MG tablet Take 100 mg by mouth daily.     MAGNESIUM PO Take 1 capsule by mouth daily.     Multiple Vitamin (MULTIVITAMIN WITH MINERALS) TABS tablet Take 1 tablet by mouth daily.     SM OMEGA-3-6-9 FATTY ACIDS PO Take 1,600 mg by mouth 2 (two) times daily.     timolol (BETIMOL) 0.5 % ophthalmic solution Place 1 drop into the left eye daily.     triamcinolone cream (KENALOG) 0.1 % triamcinolone acetonide 0.1 % topical cream     venlafaxine (EFFEXOR) 75 MG tablet Take 75 mg by mouth daily.     No current facility-administered medications for this visit.    Allergies as of 02/20/2021 - Review Complete 02/20/2021  Allergen Reaction Noted   Zonalon [doxepin hcl] Rash 02/13/2016   Alcohol  06/09/2009   Erythromycin Diarrhea, Nausea And Vomiting, and Other (See Comments) 01/30/2016   Erythromycin base  06/09/2009   Zonalon [doxepin] Rash 10/17/2020    Family History  Problem Relation Age of Onset   Hypertension Mother    Hyperlipidemia Mother    Dementia Mother    Heart disease Father    Hyperlipidemia Father    Hypertension Father    Stroke Father    Heart attack Father     Social History   Socioeconomic History   Marital status: Unknown    Spouse name: Not on file   Number of children: Not on file   Years of education: Not on file   Highest education level: Not on file  Occupational History   Occupation: retired  Tobacco Use   Smoking status: Former    Years: 20.00    Types: Cigarettes    Quit date: 10/15/1997    Years since quitting: 23.3   Smokeless tobacco: Never  Vaping Use   Vaping Use: Never used  Substance and Sexual Activity   Alcohol use: No   Drug use: No   Sexual activity: Not Currently  Other Topics Concern   Not on file  Social History Narrative   Not on file   Social Determinants of Health   Financial Resource Strain: Not on file  Food Insecurity: Not on file  Transportation Needs: Not on file  Physical Activity: Not on  file  Stress: Not on file  Social Connections: Not on file  Intimate Partner Violence: Not on file    Review of Systems: 12 system ROS is negative except as noted above.   Physical Exam: General:   Alert,  well-nourished, pleasant and cooperative in NAD Head:  Normocephalic and atraumatic. Eyes:  Sclera clear, no icterus.   Conjunctiva pink. Ears:  Normal auditory acuity. Nose:  No deformity, discharge,  or lesions. Mouth:  No deformity or lesions.  Neck:  Supple; no masses or thyromegaly. Lungs:  Clear throughout to auscultation.   No wheezes. Heart:  Regular rate and rhythm; no murmurs. Abdomen:  Soft, nontender, nondistended, normal bowel sounds, no rebound or guarding. No hepatosplenomegaly.   Rectal:  Deferred  Msk:  Symmetrical. No boney deformities LAD: No inguinal or umbilical LAD Extremities:  No clubbing or edema. Neurologic:  Alert and  oriented x4;  grossly nonfocal Skin:  Intact without significant lesions or rashes. Psych:  Alert and cooperative. Normal mood and affect.  I spent 45 minutes, including in depth chart review, independent review of results, face-to-face time with the patient, coordinating care, ordering studies and medications as appropriate, and documentation.    Irlanda Croghan L. Tarri Glenn, MD, MPH 02/20/2021, 8:29 PM

## 2021-02-21 ENCOUNTER — Encounter: Payer: Self-pay | Admitting: Nurse Practitioner

## 2021-02-21 ENCOUNTER — Other Ambulatory Visit: Payer: Self-pay

## 2021-02-21 ENCOUNTER — Ambulatory Visit (INDEPENDENT_AMBULATORY_CARE_PROVIDER_SITE_OTHER): Payer: Medicare Other | Admitting: Nurse Practitioner

## 2021-02-21 VITALS — BP 110/63 | HR 79 | Ht 65.0 in | Wt 163.8 lb

## 2021-02-21 DIAGNOSIS — E782 Mixed hyperlipidemia: Secondary | ICD-10-CM

## 2021-02-21 DIAGNOSIS — I10 Essential (primary) hypertension: Secondary | ICD-10-CM | POA: Diagnosis not present

## 2021-02-21 DIAGNOSIS — E109 Type 1 diabetes mellitus without complications: Secondary | ICD-10-CM | POA: Diagnosis not present

## 2021-02-21 LAB — POCT GLYCOSYLATED HEMOGLOBIN (HGB A1C): HbA1c, POC (controlled diabetic range): 8.1 % — AB (ref 0.0–7.0)

## 2021-02-21 MED ORDER — ACCU-CHEK GUIDE VI STRP: ORAL_STRIP | 3 refills | Status: DC

## 2021-02-21 MED ORDER — DEXCOM G6 SENSOR MISC: 9 refills | Status: DC

## 2021-02-21 MED ORDER — NOVOLOG 100 UNIT/ML IJ SOLN: 40.0000 [IU] | Freq: Every day | INTRAMUSCULAR | 3 refills | Status: DC

## 2021-02-21 NOTE — Progress Notes (Addendum)
Endocrinology Follow Up Note       02/21/2021, 4:40 PM   Subjective:    Patient ID: Connie Huang, female    DOB: 1951-01-15.  Connie Huang is being seen in follow up after being seen in consultation for management of currently uncontrolled symptomatic diabetes requested by Elby Beck, DO.   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   Complication of anesthesia 1973   woke up during Tonsillectomy   Depression    Diabetes mellitus without complication ([place])    Type I   Glaucoma    Hyperlipidemia    Hyponatremia 05/2015   Lumbar stenosis    Pneumonia 05/2015   PONV (postoperative nausea and vomiting)     Past Surgical History:  Procedure Laterality Date   ANTERIOR LAT LUMBAR FUSION Left 02/15/2016   Procedure: LEFT LUMBAR THREE-FOUR ANTEROLATERAL LUMBAR INTERBODY FUSION WITH LATERAL PLATE;  Surgeon: Erline Levine, MD;  Location: [facility];  Service: Neurosurgery;  Laterality: Left;   BACK SURGERY     Lumbar 4 and 5 Fusion   COLONOSCOPY  2015   last colonoscopy was done in [place] [facility]   TONSILLECTOMY      Social History   Socioeconomic History   Marital status: Unknown    Spouse name: Not on file   Number of children: Not on file   Years of education: Not on file   Highest education level: Not on file  Occupational History   Occupation: retired  Tobacco Use   Smoking status: Former    Years: 20.00    Types: Cigarettes    Quit date: 10/15/1997    Years since quitting: 23.3   Smokeless tobacco: Never  Vaping Use   Vaping Use: Never used  Substance and Sexual Activity   Alcohol use: No   Drug use: No   Sexual activity: Not Currently  Other Topics Concern   Not on file  Social History Narrative   Not on file    Social Determinants of Health   Financial Resource Strain: Not on file  Food Insecurity: Not on file  Transportation Needs: Not on file  Physical Activity: Not on file  Stress: Not on file  Social Connections: Not on file    Family History  Problem Relation Age of Onset   Hypertension Mother    Hyperlipidemia Mother    Dementia Mother    Heart disease Father    Hyperlipidemia Father    Hypertension Father    Stroke Father    Heart attack Father     Outpatient Encounter Medications as of 02/21/2021  Medication Sig   Accu-Chek FastClix Lancets MISC Accu-Chek Fastclix Lancet Drum  USE AS DIRECTED   Ascorbic Acid (VITAMIN C) 1000 MG tablet Take 1,000 mg by mouth daily.   aspirin 81 MG tablet Take 81 mg by mouth daily.   atorvastatin (LIPITOR) 10 MG tablet Take 10 mg by mouth daily at 6 PM.  B Complex-C (SUPER B COMPLEX PO) Take 1 tablet by mouth daily.   Calcium Carb-Cholecalciferol 600-800 MG-UNIT TABS Take 1 tablet by mouth 2 (two) times daily.   cetirizine (ZYRTEC) 10 MG tablet Take 10 mg by mouth daily as needed for allergies.   Cholecalciferol (VITAMIN D3) 5000 units TABS Take 5,000 Units by mouth daily.   Continuous Blood Gluc Receiver (DEXCOM G6 RECEIVER) DEVI Dexcom G6 Receiver misc  USE AS DIRECTED   Continuous Blood Gluc Transmit (DEXCOM G6 TRANSMITTER) MISC See admin instructions.   Insulin Human (INSULIN PUMP) SOLN Inject into the skin. As directed   losartan (COZAAR) 100 MG tablet Take 100 mg by mouth daily.   MAGNESIUM PO Take 1 capsule by mouth daily.   Multiple Vitamin (MULTIVITAMIN WITH MINERALS) TABS tablet Take 1 tablet by mouth daily.   SM OMEGA-3-6-9 FATTY ACIDS PO Take 1,600 mg by mouth 2 (two) times daily.   timolol (BETIMOL) 0.5 % ophthalmic solution Place 1 drop into the left eye 2 (two) times daily.   triamcinolone cream (KENALOG) 0.1 % triamcinolone acetonide 0.1 % topical cream   venlafaxine (EFFEXOR) 75 MG tablet Take 75 mg by mouth daily.    [DISCONTINUED] Continuous Blood Gluc Sensor (DEXCOM G6 SENSOR) MISC See admin instructions.   [DISCONTINUED] glucose blood (ACCU-CHEK GUIDE) test strip Accu-Chek Guide test strips  USE AS DIRECTED TO CHECK BLOOD GLUCOSE FOUR TIMES A DAY   [DISCONTINUED] NOVOLOG 100 UNIT/ML injection SMARTSIG:50 Unit(s) SUB-Q Daily   Continuous Blood Gluc Sensor (DEXCOM G6 SENSOR) MISC Use to monitor glucose 4 times daily   glucose blood (ACCU-CHEK GUIDE) test strip Use as instructed to monitor glucose 4 times a day as needed.  Use as back up meter for CGM   NOVOLOG 100 UNIT/ML injection Inject 40 Units into the skin daily. Use with insulin pump   No facility-administered encounter medications on file as of 02/21/2021.    ALLERGIES: Allergies  Allergen Reactions   Zonalon [Doxepin Hcl] Rash   Alcohol    Erythromycin Diarrhea, Nausea And Vomiting and Other (See Comments)   Erythromycin Base    Zonalon [Doxepin] Rash    VACCINATION STATUS:  There is no immunization history on file for this patient.  Diabetes She presents for her follow-up diabetic visit. She has type 1 diabetes mellitus. Onset time: Diagnosed at approx age of 72. Her disease course has been stable. There are no hypoglycemic associated symptoms. There are no hypoglycemic complications. (About 1 year ago (May 2021), she had hypoglycemic event where she was pulling into a parking space and she blacked out and required attention at the ED.) Symptoms are stable. Diabetic complications include nephropathy, peripheral neuropathy, PVD and retinopathy. Risk factors for coronary artery disease include diabetes mellitus, hypertension, dyslipidemia, post-menopausal and sedentary lifestyle. Current diabetic treatment includes insulin pump. She is compliant with treatment all of the time. Her weight is stable. She is following a generally healthy diet. Meal planning includes avoidance of concentrated sweets and ADA exchanges. She has not had a previous visit  with a dietitian. She participates in exercise intermittently. Her home blood glucose trend is fluctuating minimally. Her breakfast blood glucose range is generally 110-130 mg/dl. Her lunch blood glucose range is generally 110-130 mg/dl. Her dinner blood glucose range is generally 140-180 mg/dl. Her bedtime blood glucose range is generally 140-180 mg/dl. Her overall blood glucose range is 140-180 mg/dl. (She presents today with her insulin pump, CGM, and logs showing fluctuating glycemic profile with improving readings over the past  week.  Her POCT a1c today is 8.1%, increasing from last visit of 7.5%.  She has had several infections since last visit and the holidays were stressful for her which contributed to her higher than normal readings.   She denies any hypoglycemia. ) An ACE inhibitor/angiotensin II receptor blocker is being taken. She does not see a podiatrist.Eye exam is current.  Hypertension This is a chronic problem. The current episode started more than 1 year ago. The problem has been resolved since onset. The problem is controlled. There are no associated agents to hypertension. Risk factors for coronary artery disease include diabetes mellitus, dyslipidemia, family history and post-menopausal state. Past treatments include angiotensin blockers. The current treatment provides significant improvement. There are no compliance problems.  Hypertensive end-organ damage includes kidney disease, PVD and retinopathy. Identifiable causes of hypertension include chronic renal disease.  Hyperlipidemia This is a chronic problem. The current episode started more than 1 year ago. The problem is controlled. Recent lipid tests were reviewed and are normal. Exacerbating diseases include chronic renal disease and diabetes. There are no known factors aggravating her hyperlipidemia. Current antihyperlipidemic treatment includes statins. The current treatment provides moderate improvement of lipids. There are no  compliance problems.  Risk factors for coronary artery disease include diabetes mellitus, dyslipidemia, family history, hypertension and post-menopausal.    Review of systems  Constitutional: + Minimally fluctuating body weight,  current Body mass index is 27.26 kg/m. , no fatigue, no subjective hyperthermia, no subjective hypothermia Eyes: no blurry vision, no xerophthalmia ENT: no sore throat, no nodules palpated in throat, no dysphagia/odynophagia, no hoarseness Cardiovascular: no chest pain, no shortness of breath, no palpitations, no leg swelling Respiratory: no cough, no shortness of breath Gastrointestinal: no nausea/vomiting/diarrhea Musculoskeletal: no muscle/joint aches Skin: no rashes, no hyperemia Neurological: no tremors, no numbness, no tingling, no dizziness Psychiatric: no depression, no anxiety  Objective:     BP 110/63    Pulse 79    Ht 5\' 5"  (1.651 m)    Wt 163 lb 12.8 oz (74.3 kg)    SpO2 99%    BMI 27.26 kg/m   Wt Readings from Last 3 Encounters:  02/21/21 163 lb 12.8 oz (74.3 kg)  02/20/21 164 lb 6.4 oz (74.6 kg)  10/31/20 175 lb 6.4 oz (79.6 kg)     BP Readings from Last 3 Encounters:  02/21/21 110/63  02/20/21 120/70  10/31/20 (!) 130/52      Physical Exam- Limited  Constitutional:  Body mass index is 27.26 kg/m. , not in acute distress, normal state of mind Eyes:  EOMI, no exophthalmos Neck: Supple Cardiovascular: RRR, no murmurs, rubs, or gallops, no edema Respiratory: Adequate breathing efforts, no crackles, rales, rhonchi, or wheezing Musculoskeletal: no gross deformities, strength intact in all four extremities, no gross restriction of joint movements Skin:  no rashes, no hyperemia Neurological: no tremor with outstretched hands  CMP ( most recent) CMP     Component Value Date/Time   NA 135 02/13/2016 1419   K 4.0 02/13/2016 1419   CL 101 02/13/2016 1419   CO2 28 02/13/2016 1419   GLUCOSE 116 (H) 02/13/2016 1419   BUN 13 02/13/2016  1419   CREATININE 0.79 02/13/2016 1419   CALCIUM 9.5 02/13/2016 1419   GFRNONAA 72 10/06/2020 0000   GFRAA >60 02/13/2016 1419     Diabetic Labs (most recent): Lab Results  Component Value Date   HGBA1C 8.1 (A) 02/21/2021   HGBA1C 7.5 10/06/2020   HGBA1C 6.9 04/04/2020  Lipid Panel ( most recent) Lipid Panel     Component Value Date/Time   TRIG 67 10/06/2020 0000   LDLCALC 85 10/06/2020 0000      No results found for: TSH, FREET4         Assessment & Plan:   1) Type 1 diabetes mellitus without complication ([place])  She presents today with her insulin pump, CGM, and logs showing fluctuating glycemic profile with improving readings over the past week.  Her POCT a1c today is 8.1%, increasing from last visit of 7.5%.  She has had several infections since last visit and the holidays were stressful for her which contributed to her higher than normal readings.  She denies any hypoglycemia.   Connie Huang has currently uncontrolled symptomatic type 2 DM since 71 years of age.  -Recent labs reviewed.  - I had a long discussion with her about the progressive nature of diabetes and the pathology behind its complications. -her diabetes is complicated by retinopathy, PVD, mild CKD and she remains at a high risk for more acute and chronic complications which include CAD, CVA, CKD, retinopathy, and neuropathy. These are all discussed in detail with her.  - Nutritional counseling repeated at each appointment due to patients tendency to fall back in to old habits.  - The patient admits there is a room for improvement in their diet and drink choices. -  Suggestion is made for the patient to avoid simple carbohydrates from their diet including Cakes, Sweet Desserts / Pastries, Ice Cream, Soda (diet and regular), Sweet Tea, Candies, Chips, Cookies, Sweet Pastries, Store Bought Juices, Alcohol in Excess of 1-2 drinks a day, Artificial Sweeteners, Coffee Creamer, and "Sugar-free"  Products. This will help patient to have stable blood glucose profile and potentially avoid unintended weight gain.   - I encouraged the patient to switch to unprocessed or minimally processed complex starch and increased protein intake (animal or plant source), fruits, and vegetables.   - Patient is advised to stick to a routine mealtimes to eat 3 meals a day and avoid unnecessary snacks (to snack only to correct hypoglycemia).  - I have approached her with the following individualized plan to manage her diabetes and patient agrees:   -For safety purposes, avoiding hypoglycemia is the number 1 priority in her case.  I discussed with her that an acceptable target A1c for her would be between 7-7.5%.  -Based on her glucose readings, only slight changes will be made to her insulin pump today.  She is advised to continue the Basal rate of 0.55 units/hr with active run time of 4 hrs, continue with Target BG of 100-150 for safety purposes. and keep same insulin sensitivity factor of 50.  Will adjust her insulin carb ratio to 1:8 to help control her postprandial hyperglycemia.  -she is encouraged to continue monitoring glucose 4 times daily (using her CGM or back up meter), before meals and before bed, and to call the clinic if she has readings less than 70 or above 200 for 3 tests in a row.  - she is warned not to take insulin without proper monitoring per orders. - Adjustment parameters are given to her for hypo and hyperglycemia in writing.  -Given her type 1 diabetes diagnosis, insulin is the only choice for treatment of her diabetes.  - Specific targets for  A1c; LDL, HDL, and Triglycerides were discussed with the patient.  2) Blood Pressure /Hypertension:  her blood pressure is controlled to target.   she  is advised to continue her current medications including Losartan 100 mg p.o. daily with breakfast.  3) Lipids/Hyperlipidemia:    Review of her recent lipid panel from 10/06/20 showed  controlled LDL at 85 .  she is advised to continue Lipitor 10 mg daily at bedtime.  Side effects and precautions discussed with her.  4)  Weight/Diet:  her Body mass index is 27.26 kg/m.  -   she is not a candidate for weight loss. Exercise, and detailed carbohydrates information provided  -  detailed on discharge instructions.  5) Chronic Care/Health Maintenance: -she is on ACEI/ARB and Statin medications and is encouraged to initiate and continue to follow up with Ophthalmology, Dentist, Podiatrist at least yearly or according to recommendations, and advised to stay away from smoking. I have recommended yearly flu vaccine and pneumonia vaccine at least every 5 years; moderate intensity exercise for up to 150 minutes weekly; and sleep for at least 7 hours a day.  - she is advised to maintain close follow up with Elby Beck, DO for primary care needs, as well as her other providers for optimal and coordinated care.     I spent 44 minutes in the care of the patient today including review of labs from [facility], Lipids, Thyroid Function, Hematology (current and previous including abstractions from other facilities); face-to-face time discussing  her blood glucose readings/logs, discussing hypoglycemia and hyperglycemia episodes and symptoms, medications doses, her options of short and long term treatment based on the latest standards of care / guidelines;  discussion about incorporating lifestyle medicine;  and documenting the encounter.    Please refer to Patient Instructions for Blood Glucose Monitoring and Insulin/Medications Dosing Guide"  in media tab for additional information. Please  also refer to " Patient Self Inventory" in the Media  tab for reviewed elements of pertinent patient history.  Connie Huang participated in the discussions, expressed understanding, and voiced agreement with the above plans.  All questions were answered to her satisfaction. she is encouraged to contact  clinic should she have any questions or concerns prior to her return visit.   Follow up plan: - Return in about 3 months (around 05/22/2021) for Diabetes F/U with A1c in office, Previsit labs, Bring meter and logs.    Rayetta Pigg, [facility] [address] [place], [place] [address] Phone: [phone] Fax: [phone]  02/21/2021, 4:40 PM

## 2021-02-24 DIAGNOSIS — E1110 Type 2 diabetes mellitus with ketoacidosis without coma: Secondary | ICD-10-CM

## 2021-02-24 HISTORY — DX: Type 2 diabetes mellitus with ketoacidosis without coma: E11.10

## 2021-02-26 ENCOUNTER — Other Ambulatory Visit: Payer: Self-pay

## 2021-02-26 ENCOUNTER — Telehealth: Payer: Self-pay | Admitting: *Deleted

## 2021-02-26 MED ORDER — ACCU-CHEK GUIDE VI STRP: ORAL_STRIP | 3 refills | Status: DC

## 2021-02-26 MED ORDER — DEXCOM G6 SENSOR MISC: 6 refills | Status: DC

## 2021-02-26 MED ORDER — NOVOLOG 100 UNIT/ML IJ SOLN: 40.0000 [IU] | Freq: Every day | INTRAMUSCULAR | 3 refills | Status: DC

## 2021-02-26 NOTE — Telephone Encounter (Signed)
Connie Huang Oct 22, 1950 [id number]   Dear Loree Fee, NP   Dr. Tarri Glenn has scheduled the above individual for a(n) colonoscopy at 2:30 pm on Tuesday 03/13/21.  Our records show that this patient is on insulin therapy via an insulin pump.  Our colonoscopy prep protocol requires that:   the patient must be on a clear liquid diet the entire day prior to the procedure date as well as the morning of the procedure  the patient must be NPO for 3 to 4 hours prior to the procedure   the patient must consume a PEG 3350 solution to prepare for the procedure.  Please advise [place] of any adjustments that need to be made to the patients insulin pump therapy prior to the above procedure date.    Please route or fax your response to [phone] .  If you have any questions, please call me at [phone].  Thank you for your help with this matter.  Sincerely,  Caryl [facility]

## 2021-02-27 ENCOUNTER — Telehealth: Payer: Self-pay | Admitting: Nurse Practitioner

## 2021-02-27 ENCOUNTER — Encounter: Payer: Self-pay | Admitting: Nurse Practitioner

## 2021-02-27 NOTE — Telephone Encounter (Signed)
Called patient and she stated that she had to be in the hospital for 3 days due to her elevated blood glucose because her insulin site had come out and she did not realize it and her blood glucose was over 800. Patient came home last night and her blood sugar was 159. Today 186 at 9am 230 before lunch 283 after lunch  Also patient was given Insulin Aspart instead of Novolog and wants to know if this is OK to use?  Also patient changed her carb ratio to 8 and she is wanting to know if she should change to a carb ratio of 7?  Patient wants to know if she needs to come in for a visit?  Patient will have [facility] send fax for her Dexcom.

## 2021-02-27 NOTE — Telephone Encounter (Signed)
Pt states she was in the hospital due to her diabetes and she is requesting to speak with Jinny Blossom to give her information on it.  [phone]

## 2021-02-27 NOTE — Telephone Encounter (Signed)
Patient called back to see if you have received anything from [employer] regarding her Dexcom from medicare

## 2021-02-27 NOTE — Telephone Encounter (Signed)
Good morning!  She will need to lower her basal rate by half.  Currently, she is on 0.55 units per hour, therefore she will need to cut it back to 0.25 units per hour the day of her colonoscopy (until the procedure is over).  She will avoid any boluses since she will be fasting.  If she needs help manipulating her pump, she can either call here or bring it here and I can assist her with it.  Rayetta Pigg, FNP

## 2021-02-28 MED ORDER — NOVOLOG 100 UNIT/ML IJ SOLN: 50.0000 [IU] | Freq: Every day | INTRAMUSCULAR | 3 refills | Status: DC

## 2021-02-28 NOTE — Telephone Encounter (Signed)
done

## 2021-02-28 NOTE — Telephone Encounter (Signed)
Lets keep her at the same settings for her pump for now.  The UTI likely is contributing to her higher numbers as well as the pump site coming lose.  It may take a few days for her numbers to get back on track.  If we change her settings it may put her at a risk of low glucose which is much more dangerous.

## 2021-02-28 NOTE — Telephone Encounter (Signed)
Called patient and gave her the message from [place]. Patient verbalized an understanding and stated that she is feeling better this morning. Patient will keep her carb ratio at 8. Patient asked if you can send in a new prescription for her insulin for 50 units instead of 40 units because she will not have enough to last her due to priming her tubing and will not be enough insulin with the 4 bottles they gave her at the pharmacy?

## 2021-03-01 ENCOUNTER — Encounter: Payer: Self-pay | Admitting: Nurse Practitioner

## 2021-03-02 ENCOUNTER — Telehealth: Payer: Self-pay

## 2021-03-02 NOTE — Telephone Encounter (Signed)
[place] and the lady I spoke with stated that it does not require a prior authorization but it stated that this is no longer on her formulary and the lady is going to reach out to the patient to make sure no changes in insurance and try again.

## 2021-03-05 ENCOUNTER — Other Ambulatory Visit: Payer: Self-pay

## 2021-03-05 MED ORDER — DEXCOM G6 TRANSMITTER MISC: 1 refills | Status: DC

## 2021-03-08 NOTE — Telephone Encounter (Signed)
Spoke with patient and informed her of insulin pump instructions. Patient voiced understanding.

## 2021-03-08 NOTE — Telephone Encounter (Signed)
Left message for patient to call office.  

## 2021-03-13 ENCOUNTER — Encounter: Payer: Medicare Other | Admitting: Gastroenterology

## 2021-04-05 ENCOUNTER — Telehealth: Payer: Self-pay | Admitting: Nurse Practitioner

## 2021-04-05 DIAGNOSIS — E109 Type 1 diabetes mellitus without complications: Secondary | ICD-10-CM

## 2021-04-05 NOTE — Telephone Encounter (Signed)
Josh called from [employer] and states it is a Research officer, trade union is going through right now with Medicare and until it is completed they are having this issue of not getting patient supplies approved, he did speak with the patient and she told him that she has received these prescriptions before at the [facility] on [address] in [place], he states the patient wanted him to call and let us know to send the Dexcom supplies there for her. He also is available for a call back if needed. ? ?[id number] [address], [place] [address] [place] Phone:  [phone]  ?Fax:  [phone]  ?  ? ?

## 2021-04-05 NOTE — Telephone Encounter (Signed)
Pt called about her dexcom supplies and states that [employer] is requiring another authorization through her insurance (Medicare) before she can get a refill, she states this happened last month as well. Pt is requesting a call back ?

## 2021-04-06 MED ORDER — DEXCOM G6 TRANSMITTER MISC: 1 refills | Status: DC

## 2021-04-06 MED ORDER — DEXCOM G6 SENSOR MISC: 0 refills | Status: DC

## 2021-04-06 NOTE — Telephone Encounter (Signed)
Rx's for Dexcom G6 sensors and transmitter sent to [facility] on [address]. ?

## 2021-04-12 ENCOUNTER — Telehealth: Payer: Self-pay | Admitting: Gastroenterology

## 2021-04-12 NOTE — Telephone Encounter (Signed)
Inbound call from patient have questions about taking prep and traveling the day of procedure Best contact number [phone] cell [phone] ?

## 2021-04-16 NOTE — Telephone Encounter (Signed)
Spoke with patient and confirmed instructions. Patient voiced understanding.  

## 2021-04-17 ENCOUNTER — Encounter: Payer: Self-pay | Admitting: Gastroenterology

## 2021-04-17 ENCOUNTER — Ambulatory Visit (AMBULATORY_SURGERY_CENTER): Payer: Medicare Other | Admitting: Gastroenterology

## 2021-04-17 VITALS — BP 112/48 | HR 77 | Temp 96.8°F | Resp 12 | Ht 65.0 in | Wt 164.0 lb

## 2021-04-17 DIAGNOSIS — K5730 Diverticulosis of large intestine without perforation or abscess without bleeding: Secondary | ICD-10-CM | POA: Diagnosis not present

## 2021-04-17 DIAGNOSIS — Z538 Procedure and treatment not carried out for other reasons: Secondary | ICD-10-CM

## 2021-04-17 DIAGNOSIS — R195 Other fecal abnormalities: Secondary | ICD-10-CM

## 2021-04-17 DIAGNOSIS — K6289 Other specified diseases of anus and rectum: Secondary | ICD-10-CM

## 2021-04-17 DIAGNOSIS — R194 Change in bowel habit: Secondary | ICD-10-CM

## 2021-04-17 MED ORDER — SODIUM CHLORIDE 0.9 % IV SOLN: 500.0000 mL | Freq: Once | INTRAVENOUS | Status: DC

## 2021-04-17 NOTE — Progress Notes (Signed)
PT will follow up with Dr. Tarri Glenn in the office in 4 weeks to discuss pathology results. ?

## 2021-04-17 NOTE — Progress Notes (Signed)
Called to room to assist during endoscopic procedure.  Patient ID and intended procedure confirmed with present staff. Received instructions for my participation in the procedure from the performing physician.  

## 2021-04-17 NOTE — Patient Instructions (Signed)
Thank you for letting us take care of your healthcare needs today ?Please see handouts given to you on Diverticulosis and Hemorrhoids. ?Follow up with Dr. Tarri Glenn in the office to discuss pathology results and further Colonscopy. ?Use a bulking agent like Metamucil daily. ? ? ? ? ?YOU HAD AN ENDOSCOPIC PROCEDURE TODAY AT [facility]:   Refer to the procedure report that was given to you for any specific questions about what was found during the examination.  If the procedure report does not answer your questions, please call your gastroenterologist to clarify.  If you requested that your care partner not be given the details of your procedure findings, then the procedure report has been included in a sealed envelope for you to review at your convenience later. ? ?YOU SHOULD EXPECT: Some feelings of bloating in the abdomen. Passage of more gas than usual.  Walking can help get rid of the air that was put into your GI tract during the procedure and reduce the bloating. If you had a lower endoscopy (such as a colonoscopy or flexible sigmoidoscopy) you may notice spotting of blood in your stool or on the toilet paper. If you underwent a bowel prep for your procedure, you may not have a normal bowel movement for a few days. ? ?Please Note:  You might notice some irritation and congestion in your nose or some drainage.  This is from the oxygen used during your procedure.  There is no need for concern and it should clear up in a day or so. ? ?SYMPTOMS TO REPORT IMMEDIATELY: ? ?Following lower endoscopy (colonoscopy or flexible sigmoidoscopy): ? Excessive amounts of blood in the stool ? Significant tenderness or worsening of abdominal pains ? Swelling of the abdomen that is new, acute ? Fever of 100?F or higher ? ?For urgent or emergent issues, a gastroenterologist can be reached at any hour by calling [phone]. ?Do not use MyChart messaging for urgent concerns.  ? ? ?DIET:  We do recommend a small  meal at first, but then you may proceed to your regular diet.  Drink plenty of fluids but you should avoid alcoholic beverages for 24 hours. ? ?ACTIVITY:  You should plan to take it easy for the rest of today and you should NOT DRIVE or use heavy machinery until tomorrow (because of the sedation medicines used during the test).   ? ?FOLLOW UP: ?Our staff will call the number listed on your records 48-72 hours following your procedure to check on you and address any questions or concerns that you may have regarding the information given to you following your procedure. If we do not reach you, we will leave a message.  We will attempt to reach you two times.  During this call, we will ask if you have developed any symptoms of COVID 19. If you develop any symptoms (ie: fever, flu-like symptoms, shortness of breath, cough etc.) before then, please call [phone].  If you test positive for Covid 19 in the 2 weeks post procedure, please call and report this information to [place].   ? ?If any biopsies were taken you will be contacted by phone or by letter within the next 1-3 weeks.  Please call us at [phone] if you have not heard about the biopsies in 3 weeks.  ? ? ?SIGNATURES/CONFIDENTIALITY: ?You and/or your care partner have signed paperwork which will be entered into your electronic medical record.  These signatures attest to the fact that that the information  above on your After Visit Summary has been reviewed and is understood.  Full responsibility of the confidentiality of this discharge information lies with you and/or your care-partner.  ?

## 2021-04-17 NOTE — Op Note (Signed)
[facility] ?Patient Name: Connie Huang ?Procedure Date: 04/17/2021 11:11 AM ?MRN: [id number] ?Endoscopist: Thornton Park MD, MD ?Age: 71 ?Referring MD:  ?Date of Birth: October 25, 1950 ?Gender: Female ?Account #: [id number] ?Procedure:                Colonoscopy ?Indications:              Change in bowel habits ?Medicines:                Monitored Anesthesia Care ?Procedure:                Pre-Anesthesia Assessment: ?                          - Prior to the procedure, a History and Physical  ?                          was performed, and patient medications and  ?                          allergies were reviewed. The patient's tolerance of  ?                          previous anesthesia was also reviewed. The risks  ?                          and benefits of the procedure and the sedation  ?                          options and risks were discussed with the patient.  ?                          All questions were answered, and informed consent  ?                          was obtained. Prior Anticoagulants: The patient has  ?                          taken no previous anticoagulant or antiplatelet  ?                          agents. ASA Grade Assessment: II - A patient with  ?                          mild systemic disease. After reviewing the risks  ?                          and benefits, the patient was deemed in  ?                          satisfactory condition to undergo the procedure. ?                          After obtaining informed consent, the colonoscope  ?  was passed under direct vision. Throughout the  ?                          procedure, the patient's blood pressure, pulse, and  ?                          oxygen saturations were monitored continuously. The  ?                          Olympus CF-HQ190L (#[id number]) Colonoscope was  ?                          introduced through the anus with the intention of  ?                          advancing to the cecum. The  scope was advanced to  ?                          the descending colon before the procedure was  ?                          aborted. Medications were given. The colonoscopy  ?                          was unusually difficult due to poor bowel prep. The  ?                          patient tolerated the procedure well. The quality  ?                          of the bowel preparation was poor. The rectum was  ?                          photographed. ?Scope In: 11:18:48 AM ?Scope Out: 11:28:19 AM ?Total Procedure Duration: 0 hours 9 minutes 31 seconds  ?Findings:                 The perianal exam was abnormal. There is one  ?                          non-bleeding external anal polyp. There is poor  ?                          rectal tone. Posterior skin tags present. ?                          A localized area of severely erythematous mucosa  ?                          was found in the most distal rectum consistent with  ?                          changes of prolapse. Biopsies were taken with a  ?  cold forceps for histology. Estimated blood loss  ?                          was minimal. ?                          Multiple small and large-mouthed diverticula were  ?                          found in the sigmoid colon and descending colon. ?                          A moderate amount of stool was found in the entire  ?                          colon, interfering with visualization. It did not  ?                          improve as the scope moved proximally, so the  ?                          procedure was aborted. I took biopsies from the  ?                          left-sided colon looking for microscopic colitis. ?Complications:            No immediate complications. ?Estimated Blood Loss:     Estimated blood loss was minimal. ?Impression:               - Preparation of the colon was poor. ?                          - External hemorrhoid and skin tap. ?                          - Erythematous  mucosa in the distal rectum.  ?                          Suspected changed of prolapse. Biopsied. ?                          - Diverticulosis in the sigmoid colon and in the  ?                          descending colon. ?                          - The entire examined colon is normal. Biopsied. ?                          - Stool in the entire examined colon. ?Recommendation:           - Patient has a contact number available for  ?  emergencies. The signs and symptoms of potential  ?                          delayed complications were discussed with the  ?                          patient. Return to normal activities tomorrow.  ?                          Written discharge instructions were provided to the  ?                          patient. ?                          - Resume previous diet. ?                          - Continue present medications. ?                          - Use a daily stool bulking agent such as Metamucil. ?                          - Await pathology results. ?                          - Repeat colonoscopy at the next available  ?                          appointment because the bowel preparation was poor. ?Thornton Park MD, MD ?04/17/2021 11:37:13 AM ?This report has been signed electronically. ?

## 2021-04-17 NOTE — Progress Notes (Signed)
? ?Referring Provider: Elby Beck, DO ?Primary Care Physician:  Elby Beck, DO ? ? ?Indication for Procedure :  Change in bowel habits ? ? ?IMPRESSION:  ?Change in bowel habits with mucous in the stool and rectal pressure ?She is an appropriate candidate for monitored anesthesia care in the [facility] ? ?PLAN: ?Colonoscopy with random colon biopsies ? ?HPI: Connie Huang is a 71 y.o. female who self-referred for a "rectal protrusion."  ? ?Concerned because she is feeling a fullness at her rectum.  ?It is uncomfortable but there is no rectal pain, itching, burning.  There is an associated mucous discharge requiring her to wear a pad. Mucous described as clear or brown slime.  ? ?Having a bowel movement daily to every other day, as that has not changed with recent mucous. Denies constipation or diarrhea. No straining or sense of incomplete evacuation.  ? ?Started while on a family vacation in [place] and [place].  ? ?She hasn't tried anything because she doesn't know what to try. She has mentioned it to a couple of doctors who didn't give her helpful advice.  ? ?Prior colonoscopy in [place] in 2005 and at [facility] 2015. She thinks the procedure were normal.  ? ?There is no known family history of colon cancer or polyps. No family history of stomach cancer or other GI malignancy. No family history of inflammatory bowel disease or celiac.  ? ? ?Past Medical History:  ?Diagnosis Date  ? Allergic rhinitis   ? Anxiety   ? history of - none 8 years  ? Arthritis   ? Cancer [facility])   ? Mellanoma back  ? Cataracts, bilateral   ? 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 Complication of anesthesia 1973  ? woke up during Tonsillectomy  ? Depression   ? 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 DKA (diabetic ketoacidosis) ([place]) 02/24/2021  ? Glaucoma   ? Hyperlipidemia   ? Hyponatremia 05/2015  ? Lumbar stenosis   ? Pneumonia 05/2015  ? PONV (postoperative nausea and vomiting)   ? ? ?Past Surgical  History:  ?Procedure Laterality Date  ? ANTERIOR LAT LUMBAR FUSION Left 02/15/2016  ? Procedure: LEFT LUMBAR THREE-FOUR ANTEROLATERAL LUMBAR INTERBODY FUSION WITH LATERAL PLATE;  Surgeon: Erline Levine, MD;  Location: [facility];  Service: Neurosurgery;  Laterality: Left;  ? BACK SURGERY    ? Lumbar 4 and 5 Fusion  ? COLONOSCOPY  2015  ? last colonoscopy was done in [place]  ? TONSILLECTOMY    ? ? ?Current Outpatient Medications  ?Medication Sig Dispense Refill  ? Accu-Chek Fast[employer]ix Lancets MISC Accu-Chek [employer] ? USE AS DIRECTED    ? Ascorbic Acid (VITAMIN C) 1000 MG tablet Take 1,000 mg by mouth daily.    ? aspirin 81 MG tablet Take 81 mg by mouth daily.    ? atorvastatin (LIPITOR) 10 MG tablet Take 10 mg by mouth daily at 6 PM.    ? B Complex-C (SUPER B COMPLEX PO) Take 1 tablet by mouth daily.    ? Calcium Carb-Cholecalciferol 600-800 MG-UNIT TABS Take 1 tablet by mouth 2 (two) times daily.    ? cetirizine (ZYRTEC) 10 MG tablet Take 10 mg by mouth daily as needed for allergies.    ? Cholecalciferol (VITAMIN D3) 5000 units TABS Take 5,000 Units by mouth daily.    ? Continuous Blood Gluc Receiver (DEXCOM G6 RECEIVER) [place] Receiver misc ? USE AS DIRECTED    ?  Continuous Blood Gluc Sensor (DEXCOM G6 SENSOR) MISC Use 1 sensor every 10 days to monitor glucose 4 times daily 9 each 0  ? Continuous Blood Gluc Transmit (DEXCOM G6 TRANSMITTER) MISC Use 1 transmitter every 90 days. 1 each 1  ? glucose blood (ACCU-CHEK GUIDE) test strip Use as instructed to monitor glucose 4 times a day as needed.  Use as back up meter for CGM 100 each 3  ? Insulin Human (INSULIN PUMP) SOLN Inject into the skin. As directed    ? losartan (COZAAR) 100 MG tablet Take 100 mg by mouth daily.    ? MAGNESIUM PO Take 1 capsule by mouth daily.    ? Multiple Vitamin (MULTIVITAMIN WITH MINERALS) TABS tablet Take 1 tablet by mouth daily.    ? NOVOLOG 100 UNIT/ML injection Inject 50 Units into the skin daily. Use with insulin pump 60  mL 3  ? SM OMEGA-3-6-9 FATTY ACIDS PO Take 1,600 mg by mouth 2 (two) times daily.    ? timolol (BETIMOL) 0.5 % ophthalmic solution Place 1 drop into the left eye 2 (two) times daily.    ? triamcinolone cream (KENALOG) 0.1 % triamcinolone acetonide 0.1 % topical cream    ? venlafaxine (EFFEXOR) 75 MG tablet Take 75 mg by mouth daily.    ? ?Current Facility-Administered Medications  ?Medication Dose Route Frequency Provider Last Rate Last Admin  ? 0.9 %  sodium chloride infusion  500 mL Intravenous Once Thornton Park, MD      ? ? ?Allergies as of 04/17/2021 - Review Complete 04/17/2021  ?Allergen Reaction Noted  ? Zonalon [doxepin hcl] Rash 02/13/2016  ? Erythromycin Diarrhea, Nausea And Vomiting, and Other (See Comments) 01/30/2016  ? Erythromycin base  06/09/2009  ? Zonalon [doxepin] Rash 10/17/2020  ? ? ?Family History  ?Problem Relation Age of Onset  ? Hypertension Mother   ? Hyperlipidemia Mother   ? Dementia Mother   ? Heart disease Father   ? Hyperlipidemia Father   ? Hypertension Father   ? Stroke Father   ? Heart attack Father   ? ? ?Social History  ? ?Socioeconomic History  ? Marital status: Unknown  ?  Spouse name: Not on file  ? Number of children: Not on file  ? Years of education: Not on file  ? Highest education level: Not on file  ?Occupational History  ? Occupation: retired  ?Tobacco Use  ? Smoking status: Former  ?  Years: 20.00  ?  Types: Cigarettes  ?  Quit date: 10/15/1997  ?  Years since quitting: 23.5  ? Smokeless tobacco: Never  ?Vaping Use  ? Vaping Use: Never used  ?Substance and Sexual Activity  ? Alcohol use: No  ? Drug use: No  ? Sexual activity: Not Currently  ?Other Topics Concern  ? Not on file  ?Social History Narrative  ? Not on file  ? ?Social Determinants of Health  ? ?Financial Resource Strain: Not on file  ?Food Insecurity: Not on file  ?Transportation Needs: Not on file  ?Physical Activity: Not on file  ?Stress: Not on file  ?Social Connections: Not on file  ?Intimate Partner  Violence: Not on file  ? ? ?Review of Systems: ?12 system ROS is negative except as noted above.  ? ?Physical Exam: ?General:   Alert,  well-nourished, pleasant and cooperative in NAD ?Head:  Normocephalic and atraumatic. ?Eyes:  Sclera clear, no icterus.   Conjunctiva pink. ?Ears:  Normal auditory acuity. ?Nose:  No deformity, discharge,  or lesions. ?Mouth:  No deformity or lesions.   ?Neck:  Supple; no masses or thyromegaly. ?Lungs:  Clear throughout to auscultation.   No wheezes. ?Heart:  Regular rate and rhythm; no murmurs. ?Abdomen:  Soft, nontender, nondistended, normal bowel sounds, no rebound or guarding. No hepatosplenomegaly.   ?Rectal:  Deferred  ?Msk:  Symmetrical. No boney deformities ?LAD: No inguinal or umbilical LAD ?Extremities:  No clubbing or edema. ?Neurologic:  Alert and  oriented x4;  grossly nonfocal ?Skin:  Intact without significant lesions or rashes. ?Psych:  Alert and cooperative. Normal mood and affect. ? ?I spent 45 minutes, including in depth chart review, independent review of results, face-to-face time with the patient, coordinating care, ordering studies and medications as appropriate, and documentation.   ? ?Donni Oglesby L. Tarri Glenn, MD, MPH ?04/17/2021, 11:09 AM ? ? ? ?  ?

## 2021-04-17 NOTE — Progress Notes (Signed)
VS  DT ? ?Insulin pump removed by pt.  ?

## 2021-04-17 NOTE — Progress Notes (Signed)
Report to PACU, RN, vss, BBS= Clear.  

## 2021-04-18 ENCOUNTER — Telehealth: Payer: Self-pay | Admitting: Gastroenterology

## 2021-04-18 NOTE — Telephone Encounter (Signed)
Spoke with the patient post colonoscopy from yesterday. She reports that she has gone to the bathroom 3-4 times today with dark brown liquid stool. She didn't have any issues yesterday after the procedure. This is not the same as the mucous stool she was having prior to the colonoscopy. She feels like the "prep is still running through me."Will notify Dr. Tarri Glenn. ?

## 2021-04-18 NOTE — Telephone Encounter (Signed)
Inbound call from patient. States she can not stay off the toilet. States it is runny like diarrhea and prep is still going through her  ?

## 2021-04-18 NOTE — Telephone Encounter (Signed)
Called with patient back and relayed Dr. Payton Emerald recommendations. The patient voiced understanding. ?

## 2021-04-19 ENCOUNTER — Telehealth: Payer: Self-pay | Admitting: *Deleted

## 2021-04-19 ENCOUNTER — Telehealth: Payer: Self-pay

## 2021-04-19 NOTE — Telephone Encounter (Signed)
Patient called in today with the same problem of still have an excessive amount of bowel movements. Advise of the recommendation yesterday. Patient states she just would like a nurse to call her back  ?

## 2021-04-19 NOTE — Telephone Encounter (Signed)
Call patient and she is on hold with third floor concerning a issue. ?

## 2021-04-19 NOTE — Telephone Encounter (Signed)
?  Follow up Call- ? ?Call back number 04/17/2021  ?Post procedure Call Back phone  # [phone]  ?Permission to leave phone message Yes  ?Some recent data might be hidden  ?  ? ?Patient questions: ?Message left to call us if necessary. ?

## 2021-04-20 NOTE — Telephone Encounter (Signed)
I reviewed the recommendations from Dr. Tarri Glenn again. Patient has not tired Metamucil or the Benefier.  She has had one liquid BM this am and a soft BM this afternoon.  She is reassured that it may take a while to re-establish her normal bowel habits.  She is advised to try a bland diet over the weekend.  She questions if she can take imodium.  She is advised to avoid this unless she is having multiple bowel movements a day.  She is asked to call back on Monday if her symptoms have not improved. ?

## 2021-04-24 ENCOUNTER — Telehealth: Payer: Self-pay

## 2021-04-24 NOTE — Telephone Encounter (Signed)
Patient came to the office and dropped off her [place] [employer] paperwork and needs completed ASAP. I can fax once completed. Paperwork placed on your desk. ?

## 2021-04-25 NOTE — Telephone Encounter (Signed)
Done

## 2021-04-26 NOTE — Telephone Encounter (Signed)
Faxed the completed paperwork to the [facility] at [phone] and received confirmation that the fax went through. ?

## 2021-04-26 NOTE — Telephone Encounter (Signed)
Called the patient and let her know that her paperwork has been completed and successfully faxed to the [facility] for her. I also let her know that I will hold on to her paperwork so she can pick up at her next visit. ?

## 2021-05-03 ENCOUNTER — Telehealth: Payer: Self-pay

## 2021-05-03 NOTE — Telephone Encounter (Signed)
Called the patient and left a detailed voice message to let her know that her insurance will not cover the Accu-Chek Guide test strips anymore and if she has a different back up meter that she uses. I will need to send a  new meter with strips to her [facility]. ?

## 2021-05-04 ENCOUNTER — Other Ambulatory Visit: Payer: Self-pay

## 2021-05-04 MED ORDER — CONTOUR NEXT TEST VI STRP: ORAL_STRIP | 3 refills | Status: DC

## 2021-05-04 NOTE — Telephone Encounter (Signed)
Called the patient and let her know that I am not able to get the Accu Chek Guide test strips to go through the prior authorization. Patient stated that she uses the Contour Next Link meter also and I have sent a new prescription for the Contour Next test strips to her pharmacy. Patient verbalized an understanding and will let us know if she needs anything different. ?

## 2021-05-07 ENCOUNTER — Telehealth: Payer: Self-pay

## 2021-05-07 ENCOUNTER — Other Ambulatory Visit: Payer: Self-pay

## 2021-05-07 MED ORDER — ACCU-CHEK GUIDE VI STRP: ORAL_STRIP | 3 refills | Status: DC

## 2021-05-07 NOTE — Telephone Encounter (Signed)
Resent under Dr. Dorris Fetch for Medicare coverage. ?

## 2021-05-08 ENCOUNTER — Other Ambulatory Visit: Payer: Self-pay

## 2021-05-08 MED ORDER — ACCU-CHEK GUIDE VI STRP: ORAL_STRIP | 3 refills | Status: DC

## 2021-05-10 LAB — BASIC METABOLIC PANEL
BUN: 19 (ref 4–21)
CO2: 30 — AB (ref 13–22)
Chloride: 102 (ref 99–108)
Creatinine: 0.8 (ref <=1.1)
Glucose: 108
Potassium: 4.6 mEq/L (ref 3.5–5.1)
Sodium: 140 (ref 137–147)

## 2021-05-10 LAB — LIPID PANEL
Cholesterol: 147 (ref 0–200)
HDL: 58 (ref 35–70)
LDL Cholesterol: 74
Triglycerides: 87 (ref 40–160)

## 2021-05-10 LAB — HEPATIC FUNCTION PANEL
ALT: 24 U/L (ref 7–35)
AST: 30 (ref 13–35)
Alkaline Phosphatase: 79 (ref 25–125)
Bilirubin, Total: 0.5

## 2021-05-10 LAB — COMPREHENSIVE METABOLIC PANEL
Albumin: 4.2 (ref 3.5–5.0)
Calcium: 10.2 (ref 8.7–10.7)

## 2021-05-10 LAB — HEMOGLOBIN A1C: Hemoglobin A1C: 6.5

## 2021-05-22 ENCOUNTER — Ambulatory Visit: Payer: Medicare Other | Admitting: Nurse Practitioner

## 2021-05-23 ENCOUNTER — Encounter: Payer: Self-pay | Admitting: Nurse Practitioner

## 2021-05-23 ENCOUNTER — Ambulatory Visit (INDEPENDENT_AMBULATORY_CARE_PROVIDER_SITE_OTHER): Payer: Medicare Other | Admitting: Nurse Practitioner

## 2021-05-23 VITALS — BP 104/59 | HR 77 | Ht 65.0 in | Wt 159.6 lb

## 2021-05-23 DIAGNOSIS — E109 Type 1 diabetes mellitus without complications: Secondary | ICD-10-CM | POA: Diagnosis not present

## 2021-05-23 DIAGNOSIS — I10 Essential (primary) hypertension: Secondary | ICD-10-CM

## 2021-05-23 DIAGNOSIS — E782 Mixed hyperlipidemia: Secondary | ICD-10-CM

## 2021-05-23 NOTE — Progress Notes (Signed)
? ?                                                    Endocrinology Follow Up Note  ?     05/23/2021, 4:10 PM ? ? ?Subjective:  ? ? Patient ID: Connie Huang, female    DOB: [birth year].  ?Connie Huang is being seen in follow up after being seen in consultation for management of currently uncontrolled symptomatic diabetes requested by Elby Beck, DO. ? ? ?Past Medical History:  ?Diagnosis Date  ? Allergic rhinitis   ? Anxiety   ? history of - none 8 years  ? Arthritis   ? Cancer [facility])   ? Mellanoma back  ? Cataracts, bilateral   ? 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 Complication of anesthesia 1973  ? woke up during Tonsillectomy  ? Depression   ? Diabetes mellitus without complication ([place])   ? Type I  ? DKA (diabetic ketoacidosis) ([place]) 02/24/2021  ? Glaucoma   ? Hyperlipidemia   ? Hyponatremia 05/2015  ? Lumbar stenosis   ? Pneumonia 05/2015  ? PONV (postoperative nausea and vomiting)   ? ? ?Past Surgical History:  ?Procedure Laterality Date  ? ANTERIOR LAT LUMBAR FUSION Left 02/15/2016  ? Procedure: LEFT LUMBAR THREE-FOUR ANTEROLATERAL LUMBAR INTERBODY FUSION WITH LATERAL PLATE;  Surgeon: Erline Levine, MD;  Location: [place];  Service: Neurosurgery;  Laterality: Left;  ? BACK SURGERY    ? Lumbar 4 and 5 Fusion  ? COLONOSCOPY  2015  ? last colonoscopy was done in [place]   ? TONSILLECTOMY    ? ? ?Social History  ? ?Socioeconomic History  ? Marital status: Unknown  ?  Spouse name: Not on file  ? Number of children: Not on file  ? Years of education: Not on file  ? Highest education level: Not on file  ?Occupational History  ? Occupation: retired  ?Tobacco Use  ? Smoking status: Former  ?  Years: 20.00  ?  Types: Cigarettes  ?  Quit date: 10/15/1997  ?  Years since quitting: 23.6  ? Smokeless tobacco: Never  ?Vaping Use  ? Vaping Use: Never used  ?Substance and Sexual Activity  ? Alcohol use: No  ? Drug use: No  ? Sexual activity: Not Currently  ?Other Topics Concern  ? Not on file   ?Social History Narrative  ? Not on file  ? ?Social Determinants of Health  ? ?Financial Resource Strain: Not on file  ?Food Insecurity: Not on file  ?Transportation Needs: Not on file  ?Physical Activity: Not on file  ?Stress: Not on file  ?Social Connections: Not on file  ? ? ?Family History  ?Problem Relation Age of Onset  ? Hypertension Mother   ? Hyperlipidemia Mother   ? Dementia Mother   ? Heart disease Father   ? Hyperlipidemia Father   ? Hypertension Father   ? Stroke Father   ? Heart attack Father   ? ? ?Outpatient Encounter Medications as of 05/23/2021  ?Medication Sig  ? Accu-Chek FastClix Lancets MISC Accu-Chek [employer] ? USE AS DIRECTED  ? Ascorbic Acid (VITAMIN C) 1000 MG tablet Take 1,000 mg by mouth daily.  ? aspirin 81 MG tablet Take 81 mg by mouth daily.  ? atorvastatin (LIPITOR) 10 MG tablet Take 10 mg by  mouth daily at 6 PM.  ? B Complex-C (SUPER B COMPLEX PO) Take 1 tablet by mouth daily.  ? Calcium Carb-Cholecalciferol 600-800 MG-UNIT TABS Take 1 tablet by mouth 2 (two) times daily.  ? cetirizine (ZYRTEC) 10 MG tablet Take 10 mg by mouth daily as needed for allergies.  ? Cholecalciferol (VITAMIN D3) 5000 units TABS Take 5,000 Units by mouth daily.  ? Continuous Blood Gluc Receiver (DEXCOM G6 RECEIVER) [place] Receiver misc ? USE AS DIRECTED  ? Continuous Blood Gluc Sensor (DEXCOM G6 SENSOR) MISC Use 1 sensor every 10 days to monitor glucose 4 times daily  ? Continuous Blood Gluc Transmit (DEXCOM G6 TRANSMITTER) MISC Use 1 transmitter every 90 days.  ? glucose blood (ACCU-CHEK GUIDE) test strip Use as instructed to monitor glucose 4 times a day.  Use as back up meter for CGM  ? glucose blood (CONTOUR NEXT TEST) test strip Use as instructed to check blood glucose 4 times daily.  ? Insulin Human (INSULIN PUMP) SOLN Inject into the skin. As directed  ? ketorolac (ACULAR) 0.5 % ophthalmic solution Place 1 drop into the left eye 4 (four) times daily.  ? losartan (COZAAR) 100 MG  tablet Take 100 mg by mouth daily.  ? MAGNESIUM PO Take 1 capsule by mouth daily.  ? moxifloxacin (VIGAMOX) 0.5 % ophthalmic solution Place 1 drop into the left eye 4 (four) times daily.  ? Multiple Vitamin (MULTIVITAMIN WITH MINERALS) TABS tablet Take 1 tablet by mouth daily.  ? NOVOLOG 100 UNIT/ML injection Inject 50 Units into the skin daily. Use with insulin pump  ? prednisoLONE acetate (PRED FORTE) 1 % ophthalmic suspension Place 1 drop into the left eye 4 (four) times daily.  ? SM OMEGA-3-6-9 FATTY ACIDS PO Take 1,600 mg by mouth 2 (two) times daily.  ? timolol (BETIMOL) 0.5 % ophthalmic solution Place 1 drop into the left eye 2 (two) times daily.  ? timolol (TIMOPTIC) 0.5 % ophthalmic solution 1 drop 2 (two) times daily.  ? triamcinolone cream (KENALOG) 0.1 % triamcinolone acetonide 0.1 % topical cream  ? venlafaxine (EFFEXOR) 75 MG tablet Take 75 mg by mouth daily.  ? ?No facility-administered encounter medications on file as of 05/23/2021.  ? ? ?ALLERGIES: ?Allergies  ?Allergen Reactions  ? Zonalon [Doxepin Hcl] Rash  ? Erythromycin Diarrhea, Nausea And Vomiting and Other (See Comments)  ?  Other reaction(s): Stomach pain  ? Erythromycin Base   ? Zonalon [Doxepin] Rash  ? ? ?VACCINATION STATUS: ? ?There is no immunization history on file for this patient. ? ?Diabetes ?She presents for her follow-up diabetic visit. She has type 1 diabetes mellitus. Onset time: Diagnosed at approx age of 20. Her disease course has been improving. There are no hypoglycemic associated symptoms. There are no hypoglycemic complications. (About 1 year ago (May 2021), she had hypoglycemic event where she was pulling into a parking space and she blacked out and required attention at the ED.) Symptoms are stable. Diabetic complications include nephropathy, peripheral neuropathy, PVD and retinopathy. Risk factors for coronary artery disease include diabetes mellitus, hypertension, dyslipidemia, post-menopausal and sedentary lifestyle.  Current diabetic treatment includes insulin pump. She is compliant with treatment all of the time. Her weight is stable. She is following a generally healthy diet. Meal planning includes avoidance of concentrated sweets and ADA exchanges. She has not had a previous visit with a dietitian. She participates in exercise intermittently. Her home blood glucose trend is fluctuating minimally. (She presents today with her insulin  pump, CGM, and logs showing improved glycemic profile with at target fasting and postprandial readings.  Her most recent A1c, checked at her PCP office was 6.5% on 4/6.  She denies any significant hypoglycemia.  Adjusting her carb ratio at last visit has helped a lot.  ) An ACE inhibitor/angiotensin II receptor blocker is being taken. She does not see a podiatrist.Eye exam is current.  ?Hypertension ?This is a chronic problem. The current episode started more than 1 year ago. The problem has been resolved since onset. The problem is controlled. There are no associated agents to hypertension. Risk factors for coronary artery disease include diabetes mellitus, dyslipidemia, family history and post-menopausal state. Past treatments include angiotensin blockers. The current treatment provides significant improvement. There are no compliance problems.  Hypertensive end-organ damage includes kidney disease, PVD and retinopathy. Identifiable causes of hypertension include chronic renal disease.  ?Hyperlipidemia ?This is a chronic problem. The current episode started more than 1 year ago. The problem is controlled. Recent lipid tests were reviewed and are normal. Exacerbating diseases include chronic renal disease and diabetes. There are no known factors aggravating her hyperlipidemia. Current antihyperlipidemic treatment includes statins. The current treatment provides moderate improvement of lipids. There are no compliance problems.  Risk factors for coronary artery disease include diabetes mellitus,  dyslipidemia, family history, hypertension and post-menopausal.  ? ? ?Review of systems ? ?Constitutional: + Minimally fluctuating body weight,  current Body mass index is 26.56 kg/m?. , no fatigue, no s

## 2021-05-23 NOTE — Patient Instructions (Signed)
Diabetes Mellitus and Foot Care Foot care is an important part of your health, especially when you have diabetes. Diabetes may cause you to have problems because of poor blood flow (circulation) to your feet and legs, which can cause your skin to: Become thinner and drier. Break more easily. Heal more slowly. Peel and crack. You may also have nerve damage (neuropathy) in your legs and feet, causing decreased feeling in them. This means that you may not notice minor injuries to your feet that could lead to more serious problems. Noticing and addressing any potential problems early is the best way to prevent future foot problems. How to care for your feet Foot hygiene  Wash your feet daily with warm water and mild soap. Do not use hot water. Then, pat your feet and the areas between your toes until they are completely dry. Do not soak your feet as this can dry your skin. Trim your toenails straight across. Do not dig under them or around the cuticle. File the edges of your nails with an emery board or nail file. Apply a moisturizing lotion or petroleum jelly to the skin on your feet and to dry, brittle toenails. Use lotion that does not contain alcohol and is unscented. Do not apply lotion between your toes. Shoes and socks Wear clean socks or stockings every day. Make sure they are not too tight. Do not wear knee-high stockings since they may decrease blood flow to your legs. Wear shoes that fit properly and have enough cushioning. Always look in your shoes before you put them on to be sure there are no objects inside. To break in new shoes, wear them for just a few hours a day. This prevents injuries on your feet. Wounds, scrapes, corns, and calluses  Check your feet daily for blisters, cuts, bruises, sores, and redness. If you cannot see the bottom of your feet, use a mirror or ask someone for help. Do not cut corns or calluses or try to remove them with medicine. If you find a minor scrape,  cut, or break in the skin on your feet, keep it and the skin around it clean and dry. You may clean these areas with mild soap and water. Do not clean the area with peroxide, alcohol, or iodine. If you have a wound, scrape, corn, or callus on your foot, look at it several times a day to make sure it is healing and not infected. Check for: Redness, swelling, or pain. Fluid or blood. Warmth. Pus or a bad smell. General tips Do not cross your legs. This may decrease blood flow to your feet. Do not use heating pads or hot water bottles on your feet. They may burn your skin. If you have lost feeling in your feet or legs, you may not know this is happening until it is too late. Protect your feet from hot and cold by wearing shoes, such as at the beach or on hot pavement. Schedule a complete foot exam at least once a year (annually) or more often if you have foot problems. Report any cuts, sores, or bruises to your health care provider immediately. Where to find more information American Diabetes Association: www.diabetes.org Association of Diabetes Care & Education Specialists: www.diabeteseducator.org Contact a health care provider if: You have a medical condition that increases your risk of infection and you have any cuts, sores, or bruises on your feet. You have an injury that is not healing. You have redness on your legs or feet. You   feel burning or tingling in your legs or feet. You have pain or cramps in your legs and feet. Your legs or feet are numb. Your feet always feel cold. You have pain around any toenails. Get help right away if: You have a wound, scrape, corn, or callus on your foot and: You have pain, swelling, or redness that gets worse. You have fluid or blood coming from the wound, scrape, corn, or callus. Your wound, scrape, corn, or callus feels warm to the touch. You have pus or a bad smell coming from the wound, scrape, corn, or callus. You have a fever. You have a red  line going up your leg. Summary Check your feet every day for blisters, cuts, bruises, sores, and redness. Apply a moisturizing lotion or petroleum jelly to the skin on your feet and to dry, brittle toenails. Wear shoes that fit properly and have enough cushioning. If you have foot problems, report any cuts, sores, or bruises to your health care provider immediately. Schedule a complete foot exam at least once a year (annually) or more often if you have foot problems. This information is not intended to replace advice given to you by your health care provider. Make sure you discuss any questions you have with your health care provider. Document Revised: 08/12/2019 Document Reviewed: 08/12/2019 Elsevier Patient Education  2023 [employer]vier Inc.  

## 2021-05-29 ENCOUNTER — Ambulatory Visit: Payer: Medicare Other | Admitting: Gastroenterology

## 2021-07-03 ENCOUNTER — Other Ambulatory Visit: Payer: Self-pay | Admitting: Nurse Practitioner

## 2021-07-03 DIAGNOSIS — E109 Type 1 diabetes mellitus without complications: Secondary | ICD-10-CM

## 2021-07-06 ENCOUNTER — Telehealth: Payer: Self-pay | Admitting: Nurse Practitioner

## 2021-07-06 DIAGNOSIS — E109 Type 1 diabetes mellitus without complications: Secondary | ICD-10-CM

## 2021-07-06 MED ORDER — DEXCOM G6 TRANSMITTER MISC: 1 refills | Status: DC

## 2021-07-06 NOTE — Telephone Encounter (Signed)
Pt is asking if her Dexcom Transmitter could be sent into [facility] in [place].

## 2021-07-06 NOTE — Telephone Encounter (Signed)
Rx Sent  

## 2021-08-02 ENCOUNTER — Encounter: Payer: Self-pay | Admitting: Gastroenterology

## 2021-08-04 ENCOUNTER — Other Ambulatory Visit: Payer: Self-pay | Admitting: Nurse Practitioner

## 2021-08-28 ENCOUNTER — Ambulatory Visit: Payer: Medicare Other | Admitting: Nurse Practitioner

## 2021-08-31 ENCOUNTER — Telehealth: Payer: Self-pay | Admitting: Nurse Practitioner

## 2021-08-31 NOTE — Telephone Encounter (Signed)
Pt is on Prednisone for muscle pain. She said it is spiking her sugars up. It is a 6 day pack and started it yesterday   This morning it was 365 , did inject some insulin. Patient needs to know what she needs to do while she is on this prednisone

## 2021-09-03 ENCOUNTER — Other Ambulatory Visit: Payer: Self-pay

## 2021-09-03 ENCOUNTER — Ambulatory Visit (AMBULATORY_SURGERY_CENTER): Payer: Self-pay

## 2021-09-03 VITALS — Ht 65.0 in | Wt 155.8 lb

## 2021-09-03 DIAGNOSIS — R194 Change in bowel habit: Secondary | ICD-10-CM

## 2021-09-03 NOTE — Telephone Encounter (Signed)
Pt.notified

## 2021-09-03 NOTE — Progress Notes (Signed)
Denies allergies to eggs or soy products. Denies complication of anesthesia or sedation. Denies use of weight loss medication. Denies use of O2.   Emmi instructions given for colonoscopy.   Sample of Plenvu was given to the  patient. Lot # [id number] exp. Date 09/2022.

## 2021-09-18 ENCOUNTER — Encounter: Payer: Self-pay | Admitting: Nurse Practitioner

## 2021-09-18 ENCOUNTER — Ambulatory Visit (INDEPENDENT_AMBULATORY_CARE_PROVIDER_SITE_OTHER): Payer: Medicare Other | Admitting: Nurse Practitioner

## 2021-09-18 VITALS — BP 137/73 | HR 83 | Ht 65.0 in | Wt 158.0 lb

## 2021-09-18 DIAGNOSIS — E109 Type 1 diabetes mellitus without complications: Secondary | ICD-10-CM | POA: Diagnosis not present

## 2021-09-18 DIAGNOSIS — E782 Mixed hyperlipidemia: Secondary | ICD-10-CM

## 2021-09-18 DIAGNOSIS — I10 Essential (primary) hypertension: Secondary | ICD-10-CM

## 2021-09-18 LAB — POCT GLYCOSYLATED HEMOGLOBIN (HGB A1C): HbA1c, POC (controlled diabetic range): 6.9 % (ref 0.0–7.0)

## 2021-09-18 NOTE — Progress Notes (Signed)
Endocrinology Follow Up Note       09/18/2021, 11:17 AM   Subjective:    Patient ID: Connie Huang, female    DOB: 1950/05/02.  Connie Huang is being seen in follow up after being seen in consultation for management of currently uncontrolled symptomatic diabetes requested by Elby Beck, DO.   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   Complication of anesthesia 1973   woke up during Tonsillectomy   Depression    Diabetes mellitus without complication ([place])    Type I   DKA (diabetic ketoacidosis) ([place]) 02/24/2021   GERD (gastroesophageal reflux disease)    Glaucoma    Hyperlipidemia    Hypertension    Hyponatremia 05/2015   Lumbar stenosis    Neuromuscular disorder ([place])    Pneumonia 05/2015   PONV (postoperative nausea and vomiting)     Past Surgical History:  Procedure Laterality Date   ANTERIOR LAT LUMBAR FUSION Left 02/15/2016   Procedure: LEFT LUMBAR THREE-FOUR ANTEROLATERAL LUMBAR INTERBODY FUSION WITH LATERAL PLATE;  Surgeon: Erline Levine, MD;  Location: [facility];  Service: Neurosurgery;  Laterality: Left;   BACK SURGERY     Lumbar 4 and 5 Fusion   CATARACT EXTRACTION     COLONOSCOPY  2015   last colonoscopy was done in [place]   TONSILLECTOMY      Social History   Socioeconomic History   Marital status: Widowed    Spouse name: Not on file   Number of children: Not on file   Years of education: Not on file   Highest education level: Not on file  Occupational History   Occupation: retired  Tobacco Use   Smoking status: Former    Years: 20.00    Types: Cigarettes    Quit date: 10/15/1997    Years since quitting: 23.9   Smokeless tobacco: Never  Vaping Use   Vaping Use: Never used   Substance and Sexual Activity   Alcohol use: No   Drug use: No   Sexual activity: Not Currently  Other Topics Concern   Not on file  Social History Narrative   Not on file   Social Determinants of Health   Financial Resource Strain: Not on file  Food Insecurity: Not on file  Transportation Needs: Not on file  Physical Activity: Not on file  Stress: Not on file  Social Connections: Not on file    Family History  Problem Relation Age of Onset   Hypertension Mother    Hyperlipidemia Mother    Dementia Mother    Heart disease Father    Hyperlipidemia Father    Hypertension Father    Stroke Father    Heart attack Father    Colon cancer Neg Hx    Esophageal cancer Neg Hx    Rectal cancer Neg Hx    Stomach cancer Neg Hx     Outpatient Encounter Medications as of 09/18/2021  Medication Sig   ZINC SULFATE PO Take by mouth.   Accu-Chek FastClix Lancets MISC Accu-Chek Fastclix Lancet Drum  USE AS DIRECTED   Ascorbic Acid (VITAMIN C) 1000 MG tablet Take 1,000 mg by mouth daily.   aspirin 81 MG tablet Take 81 mg by mouth daily.   atorvastatin (LIPITOR) 10 MG tablet Take 10 mg by mouth daily at 6 PM.   B Complex-C (SUPER B COMPLEX PO) Take 1 tablet by mouth daily.   Calcium Carb-Cholecalciferol 600-800 MG-UNIT TABS Take 1 tablet by mouth 2 (two) times daily.   cetirizine (ZYRTEC) 10 MG tablet Take 10 mg by mouth daily as needed for allergies.   Cholecalciferol (VITAMIN D3) 5000 units TABS Take 5,000 Units by mouth daily.   Continuous Blood Gluc Receiver (DEXCOM G6 RECEIVER) DEVI Dexcom G6 Receiver misc  USE AS DIRECTED   Continuous Blood Gluc Sensor (DEXCOM G6 SENSOR) MISC USE 1 SENSOR EVERY 10 DAYS TO MONITOR GLUCOSE FOUR TIMES DAILY   Continuous Blood Gluc Transmit (DEXCOM G6 TRANSMITTER) MISC Use 1 transmitter every 90 days.   losartan (COZAAR) 100 MG tablet Take 100 mg by mouth daily.   MAGNESIUM PO Take 1 capsule by mouth daily.   Multiple Vitamin (MULTIVITAMIN WITH  MINERALS) TABS tablet Take 1 tablet by mouth daily.   NOVOLOG 100 UNIT/ML injection INJECT 50 UNITS SUBCUTANEOUSLY ONCE DAILY. USE WITH INSULIN PUMP   SM OMEGA-3-6-9 FATTY ACIDS PO Take 1,600 mg by mouth 2 (two) times daily.   triamcinolone cream (KENALOG) 0.1 % triamcinolone acetonide 0.1 % topical cream   venlafaxine (EFFEXOR) 75 MG tablet Take 75 mg by mouth daily.   [DISCONTINUED] glucose blood (ACCU-CHEK GUIDE) test strip Use as instructed to monitor glucose 4 times a day.  Use as back up meter for CGM   [DISCONTINUED] glucose blood (CONTOUR NEXT TEST) test strip Use as instructed to check blood glucose 4 times daily.   [DISCONTINUED] Insulin Human (INSULIN PUMP) SOLN Inject into the skin. As directed   [DISCONTINUED] moxifloxacin (VIGAMOX) 0.5 % ophthalmic solution Place 1 drop into the left eye 4 (four) times daily. (Patient not taking: Reported on 09/03/2021)   [DISCONTINUED] PEG-KCl-NaCl-NaSulf-Na Asc-C (PLENVU PO) Take by mouth. A sample of Plenvu was given to the patient.   [DISCONTINUED] prednisoLONE acetate (PRED FORTE) 1 % ophthalmic suspension Place 1 drop into the left eye 4 (four) times daily. (Patient not taking: Reported on 09/03/2021)   [DISCONTINUED] timolol (BETIMOL) 0.5 % ophthalmic solution Place 1 drop into the left eye 2 (two) times daily. (Patient not taking: Reported on 09/03/2021)   [DISCONTINUED] timolol (TIMOPTIC) 0.5 % ophthalmic solution 1 drop 2 (two) times daily. (Patient not taking: Reported on 09/03/2021)   No facility-administered encounter medications on file as of 09/18/2021.    ALLERGIES: Allergies  Allergen Reactions   Zonalon [Doxepin Hcl] Rash   Erythromycin Diarrhea, Nausea And Vomiting and Other (See Comments)    Other reaction(s): Stomach pain   Erythromycin Base    Zonalon [Doxepin] Rash    VACCINATION STATUS: Immunization History  Administered Date(s) Administered   Moderna Sars-Covid-2 Vaccination 04/03/2019, 05/01/2019, 12/09/2019,  06/14/2020    Diabetes She presents for her follow-up diabetic visit. She has type 1 diabetes mellitus. Onset time: Diagnosed at approx age of 50. Her disease course has been stable. There are no hypoglycemic associated symptoms. There are no hypoglycemic complications. (About 1 year ago (May 2021), she had hypoglycemic event where she was pulling into a parking space and she blacked out  and required attention at the ED.) Symptoms are stable. Diabetic complications include nephropathy, peripheral neuropathy, PVD and retinopathy. Risk factors for coronary artery disease include diabetes mellitus, hypertension, dyslipidemia, post-menopausal and sedentary lifestyle. Current diabetic treatment includes insulin pump. She is compliant with treatment all of the time. Her weight is fluctuating minimally. She is following a generally healthy diet. Meal planning includes avoidance of concentrated sweets and ADA exchanges. She has not had a previous visit with a dietitian. She participates in exercise intermittently. Her home blood glucose trend is fluctuating minimally. Her breakfast blood glucose range is generally 90-110 mg/dl. Her lunch blood glucose range is generally 110-130 mg/dl. Her dinner blood glucose range is generally 90-110 mg/dl. Her bedtime blood glucose range is generally 70-90 mg/dl. (She presents today with her logs and CGM showing mostly at target glycemic profile overall.  Her POCT A1c today is 6.9%.  She does note she was on steroids for a bit of time since last visit which ran her glucose readings up.  She has started drinking a Boost before bed to help level out her readings.) An ACE inhibitor/angiotensin II receptor blocker is being taken. She does not see a podiatrist.Eye exam is current.  Hypertension This is a chronic problem. The current episode started more than 1 year ago. The problem has been resolved since onset. The problem is controlled. There are no associated agents to hypertension.  Risk factors for coronary artery disease include diabetes mellitus, dyslipidemia, family history and post-menopausal state. Past treatments include angiotensin blockers. The current treatment provides significant improvement. There are no compliance problems.  Hypertensive end-organ damage includes kidney disease, PVD and retinopathy. Identifiable causes of hypertension include chronic renal disease.  Hyperlipidemia This is a chronic problem. The current episode started more than 1 year ago. The problem is controlled. Recent lipid tests were reviewed and are normal. Exacerbating diseases include chronic renal disease and diabetes. There are no known factors aggravating her hyperlipidemia. Current antihyperlipidemic treatment includes statins. The current treatment provides moderate improvement of lipids. There are no compliance problems.  Risk factors for coronary artery disease include diabetes mellitus, dyslipidemia, family history, hypertension and post-menopausal.     Review of systems  Constitutional: + Minimally fluctuating body weight,  current Body mass index is 26.29 kg/m. , no fatigue, no subjective hyperthermia, no subjective hypothermia Eyes: no blurry vision, no xerophthalmia ENT: no sore throat, no nodules palpated in throat, no dysphagia/odynophagia, no hoarseness Cardiovascular: no chest pain, no shortness of breath, no palpitations, no leg swelling Respiratory: no cough, no shortness of breath Gastrointestinal: no nausea/vomiting/diarrhea Musculoskeletal: no muscle/joint aches Skin: no rashes, no hyperemia Neurological: no tremors, no numbness, no tingling, no dizziness Psychiatric: no depression, no anxiety  Objective:     BP 137/73   Pulse 83   Ht '5\' 5"'$  (1.651 m)   Wt 158 lb (71.7 kg)   BMI 26.29 kg/m   Wt Readings from Last 3 Encounters:  09/18/21 158 lb (71.7 kg)  09/03/21 155 lb 12.8 oz (70.7 kg)  05/23/21 159 lb 9.6 oz (72.4 kg)     BP Readings from Last 3  Encounters:  09/18/21 137/73  05/23/21 (!) 104/59  04/17/21 (!) 112/48      Physical Exam- Limited  Constitutional:  Body mass index is 26.29 kg/m. , not in acute distress, normal state of mind Eyes:  EOMI, no exophthalmos Neck: Supple Cardiovascular: RRR, no murmurs, rubs, or gallops, no edema Respiratory: Adequate breathing efforts, no crackles, rales, rhonchi, or wheezing Musculoskeletal:  no gross deformities, strength intact in all four extremities, no gross restriction of joint movements Skin:  no rashes, no hyperemia Neurological: no tremor with outstretched hands    CMP ( most recent) CMP     Component Value Date/Time   NA 140 05/10/2021 0000   K 4.6 05/10/2021 0000   CL 102 05/10/2021 0000   CO2 30 (A) 05/10/2021 0000   GLUCOSE 116 (H) 02/13/2016 1419   BUN 19 05/10/2021 0000   CREATININE 0.8 05/10/2021 0000   CREATININE 0.79 02/13/2016 1419   CALCIUM 10.2 05/10/2021 0000   ALBUMIN 4.2 05/10/2021 0000   AST 30 05/10/2021 0000   ALT 24 05/10/2021 0000   ALKPHOS 79 05/10/2021 0000   GFRNONAA 72 10/06/2020 0000   GFRAA >60 02/13/2016 1419     Diabetic Labs (most recent): Lab Results  Component Value Date   HGBA1C 6.9 09/18/2021   HGBA1C 6.5 05/10/2021   HGBA1C 8.1 (A) 02/21/2021   MICROALBUR 10 07/06/2020     Lipid Panel ( most recent) Lipid Panel     Component Value Date/Time   CHOL 147 05/10/2021 0000   TRIG 87 05/10/2021 0000   HDL 58 05/10/2021 0000   LDLCALC 74 05/10/2021 0000      No results found for: "TSH", "FREET4"         Assessment & Plan:   1) Type 1 diabetes mellitus without complication ([place])  She presents today with her logs and CGM showing mostly at target glycemic profile overall.  Her POCT A1c today is 6.9%.  She does note she was on steroids for a bit of time since last visit which ran her glucose readings up.  She has started drinking a Boost before bed to help level out her readings.  Connie Huang has  currently uncontrolled symptomatic type 2 DM since 71 years of age.  -Recent labs reviewed.  - I had a long discussion with her about the progressive nature of diabetes and the pathology behind its complications. -her diabetes is complicated by retinopathy, PVD, mild CKD and she remains at a high risk for more acute and chronic complications which include CAD, CVA, CKD, retinopathy, and neuropathy. These are all discussed in detail with her.  - Nutritional counseling repeated at each appointment due to patients tendency to fall back in to old habits.  - The patient admits there is a room for improvement in their diet and drink choices. -  Suggestion is made for the patient to avoid simple carbohydrates from their diet including Cakes, Sweet Desserts / Pastries, Ice Cream, Soda (diet and regular), Sweet Tea, Candies, Chips, Cookies, Sweet Pastries, Store Bought Juices, Alcohol in Excess of 1-2 drinks a day, Artificial Sweeteners, Coffee Creamer, and "Sugar-free" Products. This will help patient to have stable blood glucose profile and potentially avoid unintended weight gain.   - I encouraged the patient to switch to unprocessed or minimally processed complex starch and increased protein intake (animal or plant source), fruits, and vegetables.   - Patient is advised to stick to a routine mealtimes to eat 3 meals a day and avoid unnecessary snacks (to snack only to correct hypoglycemia).  - I have approached her with the following individualized plan to manage her diabetes and patient agrees:   -For safety purposes, avoiding hypoglycemia is the number 1 priority in her case.  I discussed with her that an acceptable target A1c for her would be between 7-7.5%.  -Based on her at target glycemic profile,  no changes will be made to her pump settings today.  -She is advised to continue the Basal rate of 0.55 units/hr with active run time of 4 hrs, continue with Target BG of 100-150 for safety purposes.  and keep same insulin sensitivity factor of 50, continue insulin carb ratio to 1:8 to help control her postprandial hyperglycemia.  -she is encouraged to continue monitoring glucose 4 times daily (using her CGM or back up meter), before meals and before bed, and to call the clinic if she has readings less than 70 or above 200 for 3 tests in a row.  - she is warned not to take insulin without proper monitoring per orders. - Adjustment parameters are given to her for hypo and hyperglycemia in writing.  -Given her type 1 diabetes diagnosis, insulin is the only choice for treatment of her diabetes.  - Specific targets for  A1c; LDL, HDL, and Triglycerides were discussed with the patient.  2) Blood Pressure /Hypertension:  her blood pressure is controlled to target.   she is advised to continue her current medications including Losartan 100 mg p.o. daily with breakfast.  3) Lipids/Hyperlipidemia:    Review of her recent lipid panel from 05/10/21 showed controlled LDL at 74 .  she is advised to continue Lipitor 10 mg daily at bedtime.  Side effects and precautions discussed with her.  4)  Weight/Diet:  her Body mass index is 26.29 kg/m.  -   she is not a candidate for weight loss. Exercise, and detailed carbohydrates information provided  -  detailed on discharge instructions.  5) Chronic Care/Health Maintenance: -she is on ACEI/ARB and Statin medications and is encouraged to initiate and continue to follow up with Ophthalmology, Dentist, Podiatrist at least yearly or according to recommendations, and advised to stay away from smoking. I have recommended yearly flu vaccine and pneumonia vaccine at least every 5 years; moderate intensity exercise for up to 150 minutes weekly; and sleep for at least 7 hours a day.  - she is advised to maintain close follow up with Elby Beck, DO for primary care needs, as well as her other providers for optimal and coordinated care.     I spent 47 minutes in  the care of the patient today including review of labs from [facility], Lipids, Thyroid Function, Hematology (current and previous including abstractions from other facilities); face-to-face time discussing  her blood glucose readings/logs, discussing hypoglycemia and hyperglycemia episodes and symptoms, medications doses, her options of short and long term treatment based on the latest standards of care / guidelines;  discussion about incorporating lifestyle medicine;  and documenting the encounter. Risk reduction counseling performed per USPSTF guidelines to reduce obesity and cardiovascular risk factors.     Please refer to Patient Instructions for Blood Glucose Monitoring and Insulin/Medications Dosing Guide"  in media tab for additional information. Please  also refer to " Patient Self Inventory" in the Media  tab for reviewed elements of pertinent patient history.  Connie Huang participated in the discussions, expressed understanding, and voiced agreement with the above plans.  All questions were answered to her satisfaction. she is encouraged to contact clinic should she have any questions or concerns prior to her return visit.   Follow up plan: - Return in about 4 months (around 01/18/2022) for Diabetes F/U with A1c in office, No previsit labs, Bring meter and logs.    Rayetta Pigg, [facility] [address] [place], [place] [address] Phone: [phone] Fax:  [phone]  09/18/2021, 11:17 AM

## 2021-10-01 ENCOUNTER — Encounter: Payer: Self-pay | Admitting: Gastroenterology

## 2021-10-01 ENCOUNTER — Ambulatory Visit (AMBULATORY_SURGERY_CENTER): Payer: Medicare Other | Admitting: Gastroenterology

## 2021-10-01 VITALS — BP 124/51 | HR 86 | Temp 98.4°F | Resp 16 | Ht 65.0 in | Wt 155.8 lb

## 2021-10-01 DIAGNOSIS — K623 Rectal prolapse: Secondary | ICD-10-CM

## 2021-10-01 DIAGNOSIS — R195 Other fecal abnormalities: Secondary | ICD-10-CM

## 2021-10-01 DIAGNOSIS — K625 Hemorrhage of anus and rectum: Secondary | ICD-10-CM

## 2021-10-01 DIAGNOSIS — K5120 Ulcerative (chronic) proctitis without complications: Secondary | ICD-10-CM | POA: Diagnosis not present

## 2021-10-01 DIAGNOSIS — K5730 Diverticulosis of large intestine without perforation or abscess without bleeding: Secondary | ICD-10-CM | POA: Diagnosis not present

## 2021-10-01 DIAGNOSIS — K51218 Ulcerative (chronic) proctitis with other complication: Secondary | ICD-10-CM

## 2021-10-01 DIAGNOSIS — R194 Change in bowel habit: Secondary | ICD-10-CM

## 2021-10-01 MED ORDER — SODIUM CHLORIDE 0.9 % IV SOLN: 500.0000 mL | Freq: Once | INTRAVENOUS | Status: DC

## 2021-10-01 NOTE — Progress Notes (Signed)
Called to room to assist during endoscopic procedure.  Patient ID and intended procedure confirmed with present staff. Received instructions for my participation in the procedure from the performing physician.  

## 2021-10-01 NOTE — Progress Notes (Signed)
To pacu, VSS. Report to [email or web address] 

## 2021-10-01 NOTE — Patient Instructions (Signed)
Await pathology report.  YOU HAD AN ENDOSCOPIC PROCEDURE TODAY AT [facility]:   Refer to the procedure report that was given to you for any specific questions about what was found during the examination.  If the procedure report does not answer your questions, please call your gastroenterologist to clarify.  If you requested that your care partner not be given the details of your procedure findings, then the procedure report has been included in a sealed envelope for you to review at your convenience later.  YOU SHOULD EXPECT: Some feelings of bloating in the abdomen. Passage of more gas than usual.  Walking can help get rid of the air that was put into your GI tract during the procedure and reduce the bloating. If you had a lower endoscopy (such as a colonoscopy or flexible sigmoidoscopy) you may notice spotting of blood in your stool or on the toilet paper. If you underwent a bowel prep for your procedure, you may not have a normal bowel movement for a few days.  Please Note:  You might notice some irritation and congestion in your nose or some drainage.  This is from the oxygen used during your procedure.  There is no need for concern and it should clear up in a day or so.  SYMPTOMS TO REPORT IMMEDIATELY:  Following lower endoscopy (colonoscopy or flexible sigmoidoscopy):  Excessive amounts of blood in the stool  Significant tenderness or worsening of abdominal pains  Swelling of the abdomen that is new, acute  Fever of 100F or higher   For urgent or emergent issues, a gastroenterologist can be reached at any hour by calling [phone]. Do not use MyChart messaging for urgent concerns.    DIET:  We do recommend a small meal at first, but then you may proceed to your regular diet.  Drink plenty of fluids but you should avoid alcoholic beverages for 24 hours.  ACTIVITY:  You should plan to take it easy for the rest of today and you should NOT DRIVE or use heavy  machinery until tomorrow (because of the sedation medicines used during the test).    FOLLOW UP: Our staff will call the number listed on your records the next business day following your procedure.  We will call around 7:15- 8:00 am to check on you and address any questions or concerns that you may have regarding the information given to you following your procedure. If we do not reach you, we will leave a message.  If you develop any symptoms (ie: fever, flu-like symptoms, shortness of breath, cough etc.) before then, please call [phone].  If you test positive for Covid 19 in the 2 weeks post procedure, please call and report this information to [place].    If any biopsies were taken you will be contacted by phone or by letter within the next 1-3 weeks.  Please call us at [phone] if you have not heard about the biopsies in 3 weeks.    SIGNATURES/CONFIDENTIALITY: You and/or your care partner have signed paperwork which will be entered into your electronic medical record.  These signatures attest to the fact that that the information above on your After Visit Summary has been reviewed and is understood.  Full responsibility of the confidentiality of this discharge information lies with you and/or your care-partner.

## 2021-10-01 NOTE — Progress Notes (Signed)
[facility] History and Physical   Primary Care Physician:  Elby Beck, DO   Reason for Procedure:  Change in bowel habits, diarrhea, fecal incontinence  Plan:    Colonoscopy with possible interventions as needed     HPI: Connie Huang is a very pleasant 71 y.o. female here for colonoscopy for change in bowel habits and rectal prolapse with mucus and fecal incontinence Poor prep on last colonoscopy in March 2023  The risks and benefits as well as alternatives of endoscopic procedure(s) have been discussed and reviewed. All questions answered. The patient agrees to proceed.    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   Complication of anesthesia 1973   woke up during Tonsillectomy   Depression    Diabetes mellitus without complication ([place])    Type I   DKA (diabetic ketoacidosis) ([place]) 02/24/2021   GERD (gastroesophageal reflux disease)    Glaucoma    Hyperlipidemia    Hypertension    Hyponatremia 05/2015   Lumbar stenosis    Neuromuscular disorder ([place])    Pneumonia 05/2015   PONV (postoperative nausea and vomiting)     Past Surgical History:  Procedure Laterality Date   ANTERIOR LAT LUMBAR FUSION Left 02/15/2016   Procedure: LEFT LUMBAR THREE-FOUR ANTEROLATERAL LUMBAR INTERBODY FUSION WITH LATERAL PLATE;  Surgeon: Erline Levine, MD;  Location: [facility];  Service: Neurosurgery;  Laterality: Left;   BACK SURGERY     Lumbar 4 and 5 Fusion   CATARACT EXTRACTION     COLONOSCOPY  2015   last colonoscopy was done in [place]      Prior to Admission medications   Medication Sig Start Date End Date Taking? Authorizing Provider  Accu-Chek FastClix Lancets MISC Accu-Chek Fastclix Lancet Drum  USE AS DIRECTED    [provider]  Ascorbic Acid  (VITAMIN C) 1000 MG tablet Take 1,000 mg by mouth daily.    [provider]  aspirin 81 MG tablet Take 81 mg by mouth daily.    [provider]  atorvastatin (LIPITOR) 10 MG tablet Take 10 mg by mouth daily at 6 PM.    [provider]  B Complex-C (SUPER B COMPLEX PO) Take 1 tablet by mouth daily.    [provider]  Calcium Carb-Cholecalciferol 600-800 MG-UNIT TABS Take 1 tablet by mouth 2 (two) times daily.    [provider]  cetirizine (ZYRTEC) 10 MG tablet Take 10 mg by mouth daily as needed for allergies.    [provider]  Cholecalciferol (VITAMIN D3) 5000 units TABS Take 5,000 Units by mouth daily.    [provider]  Continuous Blood Gluc Receiver ([place]) [place] Receiver misc  USE AS DIRECTED    [provider]  Continuous Blood Gluc Sensor (DEXCOM G6 SENSOR) MISC USE 1 SENSOR EVERY 10 DAYS TO MONITOR GLUCOSE FOUR TIMES DAILY 07/04/21   Nida, Marella Chimes, MD  Continuous Blood Gluc Transmit (DEXCOM G6 TRANSMITTER) MISC Use 1 transmitter every 90 days. 07/06/21   Brita Romp, NP  losartan (COZAAR) 100 MG tablet Take 100 mg by mouth daily. 11/03/15   [provider]  MAGNESIUM PO Take 1 capsule by mouth daily.  [provider]  Multiple Vitamin (MULTIVITAMIN WITH MINERALS) TABS tablet Take 1 tablet by mouth daily.    [provider]  NOVOLOG 100 UNIT/ML injection INJECT 50 UNITS SUBCUTANEOUSLY ONCE DAILY. USE WITH INSULIN PUMP 08/08/21   Brita Romp, NP  SM OMEGA-3-6-9 FATTY ACIDS PO Take 1,600 mg by mouth 2 (two) times daily.    [provider]  triamcinolone cream (KENALOG) 0.1 % triamcinolone acetonide 0.1 % topical cream    [provider]  venlafaxine (EFFEXOR) 75 MG tablet Take 75 mg by mouth daily. 11/03/15   [provider]  ZINC SULFATE PO Take by mouth.    [provider]    Current Outpatient Medications   Medication Sig Dispense Refill   Accu-Chek FastClix Lancets MISC Accu-Chek Fastclix Lancet Drum  USE AS DIRECTED     Ascorbic Acid (VITAMIN C) 1000 MG tablet Take 1,000 mg by mouth daily.     aspirin 81 MG tablet Take 81 mg by mouth daily.     atorvastatin (LIPITOR) 10 MG tablet Take 10 mg by mouth daily at 6 PM.     B Complex-C (SUPER B COMPLEX PO) Take 1 tablet by mouth daily.     Calcium Carb-Cholecalciferol 600-800 MG-UNIT TABS Take 1 tablet by mouth 2 (two) times daily.     cetirizine (ZYRTEC) 10 MG tablet Take 10 mg by mouth daily as needed for allergies.     Cholecalciferol (VITAMIN D3) 5000 units TABS Take 5,000 Units by mouth daily.     Continuous Blood Gluc Receiver (DEXCOM G6 RECEIVER) DEVI Dexcom G6 Receiver misc  USE AS DIRECTED     Continuous Blood Gluc Sensor (DEXCOM G6 SENSOR) MISC USE 1 SENSOR EVERY 10 DAYS TO MONITOR GLUCOSE FOUR TIMES DAILY 9 each 0   Continuous Blood Gluc Transmit (DEXCOM G6 TRANSMITTER) MISC Use 1 transmitter every 90 days. 1 each 1   losartan (COZAAR) 100 MG tablet Take 100 mg by mouth daily.     MAGNESIUM PO Take 1 capsule by mouth daily.     Multiple Vitamin (MULTIVITAMIN WITH MINERALS) TABS tablet Take 1 tablet by mouth daily.     NOVOLOG 100 UNIT/ML injection INJECT 50 UNITS SUBCUTANEOUSLY ONCE DAILY. USE WITH INSULIN PUMP 40 mL 0   SM OMEGA-3-6-9 FATTY ACIDS PO Take 1,600 mg by mouth 2 (two) times daily.     triamcinolone cream (KENALOG) 0.1 % triamcinolone acetonide 0.1 % topical cream     venlafaxine (EFFEXOR) 75 MG tablet Take 75 mg by mouth daily.     ZINC SULFATE PO Take by mouth.     Current Facility-Administered Medications  Medication Dose Route Frequency Provider Last Rate Last Admin   0.9 %  sodium chloride infusion  500 mL Intravenous Once Mauri Pole, MD        Allergies as of 10/01/2021 - Review Complete 10/01/2021  Allergen Reaction Noted   Zonalon [doxepin hcl] Rash 02/13/2016   Erythromycin Diarrhea, Nausea And  Vomiting, and Other (See Comments) 01/30/2016   Erythromycin base  06/09/2009   Zonalon [doxepin] Rash 10/17/2020    Family History  Problem Relation Age of Onset   Hypertension Mother    Hyperlipidemia Mother    Dementia Mother    Heart disease Father    Hyperlipidemia Father    Hypertension Father    Stroke Father    Heart attack Father    Colon cancer Neg Hx    Esophageal cancer Neg Hx    Rectal cancer  Neg Hx    Stomach cancer Neg Hx     Social History   Socioeconomic History   Marital status: Widowed    Spouse name: Not on file   Number of children: Not on file   Years of education: Not on file   Highest education level: Not on file  Occupational History   Occupation: retired  Tobacco Use   Smoking status: Former    Years: 20.00    Types: Cigarettes    Quit date: 10/15/1997    Years since quitting: 23.9   Smokeless tobacco: Never  Vaping Use   Vaping Use: Never used  Substance and Sexual Activity   Alcohol use: No   Drug use: No   Sexual activity: Not Currently  Other Topics Concern   Not on file  Social History Narrative   Not on file   Social Determinants of Health   Financial Resource Strain: Not on file  Food Insecurity: Not on file  Transportation Needs: Not on file  Physical Activity: Not on file  Stress: Not on file  Social Connections: Not on file  Intimate Partner Violence: Not on file    Review of Systems:  All other review of systems negative except as mentioned in the HPI.  Physical Exam: Vital signs in last 24 hours: BP 139/72   Pulse 86   Temp 98.4 F (36.9 C)   Ht '5\' 5"'$  (1.651 m)   Wt 155 lb 12.8 oz (70.7 kg)   SpO2 96%   BMI 25.93 kg/m  General:   Alert, NAD Lungs:  Clear .   Heart:  Regular rate and rhythm Abdomen:  Soft, nontender and nondistended. Neuro/Psych:  Alert and cooperative. Normal mood and affect. A and O x 3  Reviewed labs, radiology imaging, old records and pertinent past GI work up  Patient is  appropriate for planned procedure(s) and anesthesia in an ambulatory setting   K. Denzil Magnuson , MD [phone]

## 2021-10-01 NOTE — Op Note (Addendum)
[place] Patient Name: Connie Huang Procedure Date: 10/01/2021 11:08 AM MRN: [id number] Endoscopist: Mauri Pole , MD Age: 71 Referring MD:  Date of Birth: 07/01/1950 Gender: Female Account #: [id number] Procedure:                Colonoscopy Indications:              Change in bowel habits, , Change in stool caliber,                            mucus, rectal bleeding, rectal prolapse Medicines:                Monitored Anesthesia Care Procedure:                Pre-Anesthesia Assessment:                           - Prior to the procedure, a History and Physical                            was performed, and patient medications and                            allergies were reviewed. The patient's tolerance of                            previous anesthesia was also reviewed. The risks                            and benefits of the procedure and the sedation                            options and risks were discussed with the patient.                            All questions were answered, and informed consent                            was obtained. Prior Anticoagulants: The patient has                            taken no previous anticoagulant or antiplatelet                            agents. ASA Grade Assessment: II - A patient with                            mild systemic disease. After reviewing the risks                            and benefits, the patient was deemed in                            satisfactory condition to undergo the procedure.  After obtaining informed consent, the colonoscope                            was passed under direct vision. Throughout the                            procedure, the patient's blood pressure, pulse, and                            oxygen saturations were monitored continuously. The                            PCF-HQ190L Colonoscope was introduced through the                            anus and  advanced to the the cecum, identified by                            appendiceal orifice and ileocecal valve. The                            colonoscopy was performed without difficulty. The                            patient tolerated the procedure well. The quality                            of the bowel preparation was fair. The ileocecal                            valve, appendiceal orifice, and rectum were                            photographed. Scope In: 11:27:33 AM Scope Out: 12:01:21 PM Scope Withdrawal Time: 0 hours 19 minutes 33 seconds  Total Procedure Duration: 0 hours 33 minutes 48 seconds  Findings:                 Moderate rectal prolapse was present.                           A continuous area of bleeding ulcerated mucosa was                            present in the rectum. Biopsies were taken with a                            cold forceps for histology.                           Scattered small-mouthed diverticula were found in                            the sigmoid colon, descending colon, transverse  colon and ascending colon.                           A moderate amount of stool was found in the entire                            colon, interfering with visualization.                           The exam was otherwise without abnormality. Complications:            No immediate complications. Estimated Blood Loss:     Estimated blood loss was minimal. Impression:               - Preparation of the colon was fair.                           - Rectal prolapse.                           - Mucosal ulceration. Biopsied.                           - Diverticulosis in the sigmoid colon, in the                            descending colon, in the transverse colon and in                            the ascending colon.                           - Stool in the entire examined colon.                           - The examination was otherwise  normal. Recommendation:           - Patient has a contact number available for                            emergencies. The signs and symptoms of potential                            delayed complications were discussed with the                            patient. Return to normal activities tomorrow.                            Written discharge instructions were provided to the                            patient.                           - Resume previous diet.                           -  Continue present medications.                           - Await pathology results.                           - Repeat colonoscopy date to be determined after                            pending pathology results are reviewed for                            surveillance.                           - Refer to colorectal surgery for management of                            rectal prolapse                           - Miralax 1 capful twice daily                           - Benefiber 1-2 tablespoon three times daily with                            meals                           - Follow up with Dr Tarri Glenn as needed, to be                            scheduled Mauri Pole, MD 10/01/2021 12:32:52 PM This report has been signed electronically.

## 2021-10-01 NOTE — Progress Notes (Signed)
Pt's states no medical or surgical changes since previsit or office visit. 

## 2021-10-02 ENCOUNTER — Telehealth: Payer: Self-pay | Admitting: *Deleted

## 2021-10-02 NOTE — Telephone Encounter (Signed)
Attempted to call patient for their post-procedure follow-up call. No answer. Left voicemail.   

## 2021-10-04 ENCOUNTER — Other Ambulatory Visit: Payer: Self-pay | Admitting: Nurse Practitioner

## 2021-10-12 ENCOUNTER — Encounter: Payer: Self-pay | Admitting: Gastroenterology

## 2021-10-18 ENCOUNTER — Encounter: Payer: Self-pay | Admitting: Nurse Practitioner

## 2021-10-18 MED ORDER — INSULIN ASPART 100 UNIT/ML IJ SOLN: INTRAMUSCULAR | 3 refills | Status: DC

## 2021-10-18 NOTE — Telephone Encounter (Signed)
done

## 2021-10-18 NOTE — Telephone Encounter (Signed)
Can you reach out and see what she needs?

## 2021-10-18 NOTE — Telephone Encounter (Signed)
Pt said she needs a 90 day supply of her insulin. She said when she went to [facility] it was not corrected.   She is asking for 5 or 6 vials so she will not stop running out

## 2021-10-19 ENCOUNTER — Other Ambulatory Visit: Payer: Self-pay | Admitting: "Endocrinology

## 2021-10-19 DIAGNOSIS — E109 Type 1 diabetes mellitus without complications: Secondary | ICD-10-CM

## 2021-10-20 ENCOUNTER — Other Ambulatory Visit: Payer: Self-pay | Admitting: "Endocrinology

## 2021-10-20 DIAGNOSIS — E109 Type 1 diabetes mellitus without complications: Secondary | ICD-10-CM

## 2021-10-22 ENCOUNTER — Other Ambulatory Visit: Payer: Self-pay | Admitting: Nurse Practitioner

## 2021-10-22 MED ORDER — INSULIN ASPART 100 UNIT/ML IJ SOLN
INTRAMUSCULAR | 3 refills | Status: DC
Start: 2021-10-22 — End: 2022-05-22

## 2021-10-23 ENCOUNTER — Ambulatory Visit: Payer: Self-pay | Admitting: Surgery

## 2021-10-23 DIAGNOSIS — K5909 Other constipation: Secondary | ICD-10-CM | POA: Diagnosis present

## 2021-10-23 DIAGNOSIS — N393 Stress incontinence (female) (male): Secondary | ICD-10-CM | POA: Diagnosis present

## 2021-10-23 DIAGNOSIS — Z9641 Presence of insulin pump (external) (internal): Secondary | ICD-10-CM

## 2021-11-07 NOTE — Progress Notes (Addendum)
COVID Vaccine received:  '[]'$  No '[x]'$  Yes Date of any COVID positive Test in last 90 days: None  PCP - Elby Beck, DO Cardiologist - none Endocrinology- Rayetta Pigg, NP [facility] [address] [place], [place] [address] Phone: [phone] Fax: [phone]    Chest x-ray - 02-13-2016  Epic EKG -  02-13-2016  Epic   will repeat at PST Stress Test -n/a ECHO - n/a Cardiac Cath - n/a  Pacemaker/ICD device     '[x]'$  N/A Spinal Cord Stimulator:'[x]'$  No '[]'$  Yes      (Remind patient to bring remote DOS)  Other Implants: Patient has an INSULIN PUMP  Bowel Prep - Dulcolax, Miralax, Flagyl, Neomycin   Confirmed that patient is aware and prepared to do bowel prep as ordered by Dr. Johney Maine.   History of Sleep Apnea? '[x]'$  No '[]'$  Yes   Sleep Study Date:   CPAP used?- '[x]'$  No '[]'$  Yes  (Instruct to bring their mask & Tubing)  Does the patient monitor blood sugar? '[]'$  No '[]'$  Yes  '[x]'$  N/A Does patient have a [employer] or Dexcom? '[]'$  No '[x]'$  Yes    DEXCOM monitor Fasting Blood Sugar Ranges-  Checks Blood Sugar continuous  times a day  Has Insulin Pump  Diabetic Coordinator- Harvel Ricks given all information regarding patient's surgery. Patient signed Insulin Pump contract and was given the Patient Flow sheet to fill out DOS.   Blood Thinner Instructions: None Aspirin Instructions:ASA 81 mg Last Dose:  ERAS Protocol Ordered: '[]'$  No  '[x]'$  Yes PRE-SURGERY '[]'$  ENSURE  '[x]'$  G2 X 3  Comments: OSTOMY NURSE CONSULT- Dawn marked patient for possible loop ileostomy vs colostomy, mark for both possibilities  Activity level: Patient can climb a flight of stairs without difficulty; '[x]'$  No CP  '[x]'$  No SOB. No problems with ADLS per patient.   Anesthesia review: PONV, "Woke up during Tonsilectomy at age 71", DM, HTN, CKD  Patient denies shortness of breath, fever, cough and chest pain at PAT appointment.  Patient verbalized understanding and agreement to the Pre-Surgical  Instructions that were given to them at this PAT appointment. Patient was also educated of the need to review these PAT instructions again prior to his/her surgery.I reviewed the appropriate phone numbers to call if they have any and questions or concerns.

## 2021-11-08 NOTE — Patient Instructions (Addendum)
SURGICAL WAITING ROOM VISITATION Patients having surgery or a procedure may have no more than 2 support people in the waiting area - these visitors may rotate in the visitor waiting room.   Children under the age of 72 must have an adult with them who is not the patient. If the patient needs to stay at the hospital during part of their recovery, the visitor guidelines for inpatient rooms apply.  PRE-OP VISITATION  Pre-op nurse will coordinate an appropriate time for 1 support person to accompany the patient in pre-op.  This support person may not rotate.  This visitor will be contacted when the time is appropriate for the visitor to come back in the pre-op area.  Please refer to the [facility] website for the visitor guidelines for Inpatients (after your surgery is over and you are in a regular room).  You are not required to quarantine at this time prior to your surgery. However, you must do this: Hand Hygiene often Do NOT share personal items Notify your provider if you are in close contact with someone who has COVID or you develop fever 100.4 or greater, new onset of sneezing, cough, sore throat, shortness of breath or body aches.   If you received a COVID test during your pre-op visit  it is requested that you wear a mask when out in public, stay away from anyone that may not be feeling well and notify your surgeon if you develop symptoms. If you test positive for Covid or have been in contact with anyone that has tested positive in the last 10 days please notify you surgeon.       Your procedure is scheduled on:  Wednesday  November 14, 2021  Report to [facility] Main Entrance.  Report to admitting at: 06:15    AM  +++++Call this number if you have any questions or problems the morning of surgery [phone]  Do not eat food :After Midnight the night prior to your surgery/procedure.  After Midnight you may have the following liquids until   05:30  AM DAY OF  SURGERY  Clear Liquid Diet Water Black Coffee (sugar ok, NO MILK/CREAM OR CREAMERS)  Tea (sugar ok, NO MILK/CREAM OR CREAMERS) regular and decaf                             Plain Jell-O  with no fruit (NO RED)                                           Fruit ices (not with fruit pulp, NO RED)                                     Popsicles (NO RED)                                                                  Juice: apple, WHITE grape, WHITE cranberry Sports drinks like Gatorade or Powerade (NO RED)            ++++  Drink 2 bottles of Pre-Surgery G2 the evening prior to your surgery to help keep you hydrated during your bowel prep.               The day of surgery:  Drink ONE (1) Pre-Surgery G2 at    05:30    AM the morning of surgery. Drink in one sitting. Do not sip.  This drink was given to you during your hospital pre-op appointment visit. Nothing else to drink after completing the Pre-Surgery G2 : No candy, chewing gum or throat lozenges.    FOLLOW BOWEL PREP AND ANY ADDITIONAL PRE OP INSTRUCTIONS YOU RECEIVED FROM YOUR SURGEON'S OFFICE!!!  Dulcolax 20 mg - Take four (4) tablets with water at 07:00 am the day prior to surgery.  Miralax 255 g - Mix with 64 oz Gatorade/Powerade.  Starting at 10:00 am ,Drink this gradually over the next few hours (8 oz glass every 15-30 minutes) until gone the day prior to surgery You should finish in 4 hours-6 hours.    Neomycin 1000 mg - At 2 pm, 3 pm and 10 pm after Miralax  bowel prep the day prior to surgery.  Metronidazole 1000 mg - At 2 pm, 3 pm and 10 pm after Miralax bowel prep the day prior to surgery.   Drink plenty of clear liquids all evening to avoid getting dehydrated.      Oral Hygiene is also important to reduce your risk of infection.        Remember - BRUSH YOUR TEETH THE MORNING OF SURGERY WITH YOUR REGULAR TOOTHPASTE   Take ONLY these medicines the morning of surgery with A SIP OF WATER: Venlafaxine (  Effexor)   Patients with Insulin Pumps  For patients with Insulin Pumps: Contact your diabetes doctor for specific instructions before surgery. Decrease basal insulin rates by 20% at midnight the night before surgery. Do not remove your insulin pump prior to arrival to short stay the morning of surgery.  Anesthesia will instruct you on when to remove your pump. Note that if your surgery is planned to be longer than 2 hours, your insulin pump will be removed and intravenous (IV) insulin will be started and managed by the nurses and anesthesiologist. You will be able to restart your insulin pump once you are awake and able to manage it. Make sure to bring insulin pump supplies to the hospital with you in case your site needs to be changed.                    You may not have any metal on your body including hair pins, jewelry, and body piercing  Do not wear make-up, lotions, powders, perfumes, or deodorant  Do not wear nail polish including gel and S&S, artificial / acrylic nails, or any other type of covering on natural nails including finger and toenails. If you have artificial nails, gel coating, etc., that needs to be removed by a nail salon, Please have this removed prior to surgery. Not doing so may mean that your surgery could be cancelled or delayed if the Surgeon or anesthesia staff feels like they are unable to monitor you safely.   Do not shave 48 hours prior to surgery to avoid nicks in your skin which may contribute to postoperative infections.     You may bring a small overnight bag with you on the day of surgery, only pack items that are not valuable . IS NOT RESPONSIBLE   FOR  VALUABLES THAT ARE LOST OR STOLEN.   DO NOT [place]. PHARMACY WILL DISPENSE MEDICATIONS LISTED ON YOUR MEDICATION LIST TO YOU DURING YOUR ADMISSION [place]!   Special Instructions: Bring a copy of your healthcare power of attorney and living will  documents the day of surgery, if you wish to have them scanned into your [facility] Records- EPIC  Please read over the following fact sheets you were given: IF YOU HAVE QUESTIONS ABOUT YOUR PRE-OP INSTRUCTIONS, PLEASE CALL [phone]  ([place])   [place] - Preparing for Surgery Before surgery, you can play an important role.  Because skin is not sterile, your skin needs to be as free of germs as possible.  You can reduce the number of germs on your skin by washing with CHG (chlorahexidine gluconate) soap before surgery.  CHG is an antiseptic cleaner which kills germs and bonds with the skin to continue killing germs even after washing. Please DO NOT use if you have an allergy to CHG or antibacterial soaps.  If your skin becomes reddened/irritated stop using the CHG and inform your nurse when you arrive at Short Stay. Do not shave (including legs and underarms) for at least 48 hours prior to the first CHG shower.  You may shave your face/neck.  Please follow these instructions carefully:  1.  Shower with CHG Soap the night before surgery and the  morning of surgery.  2.  If you choose to wash your hair, wash your hair first as usual with your normal  shampoo.  3.  After you shampoo, rinse your hair and body thoroughly to remove the shampoo.                             4.  Use CHG as you would any other liquid soap.  You can apply chg directly to the skin and wash.  Gently with a scrungie or clean washcloth.  5.  Apply the CHG Soap to your body ONLY FROM THE NECK DOWN.   Do not use on face/ open                           Wound or open sores. Avoid contact with eyes, ears mouth and genitals (private parts).                       Wash face,  Genitals (private parts) with your normal soap.             6.  Wash thoroughly, paying special attention to the area where your  surgery  will be performed.  7.  Thoroughly rinse your body with warm water from the neck down.  8.  DO NOT shower/wash  with your normal soap after using and rinsing off the CHG Soap.            9.  Pat yourself dry with a clean towel.            10.  Wear clean pajamas.            11.  Place clean sheets on your bed the night of your first shower and do not  sleep with pets.  ON THE DAY OF SURGERY : Do not apply any lotions/deodorants the morning of surgery.  Please wear clean clothes to the hospital/surgery center.    FAILURE TO FOLLOW  THESE INSTRUCTIONS MAY RESULT IN THE CANCELLATION OF YOUR SURGERY  PATIENT SIGNATURE_________________________________  NURSE SIGNATURE__________________________________  ________________________________________________________________________       Connie Huang    An incentive spirometer is a tool that can help keep your lungs clear and active. This tool measures how well you are filling your lungs with each breath. Taking long deep breaths may help reverse or decrease the chance of developing breathing (pulmonary) problems (especially infection) following: A long period of time when you are unable to move or be active. BEFORE THE PROCEDURE  If the spirometer includes an indicator to show your best effort, your nurse or respiratory therapist will set it to a desired goal. If possible, sit up straight or lean slightly forward. Try not to slouch. Hold the incentive spirometer in an upright position. INSTRUCTIONS FOR USE  Sit on the edge of your bed if possible, or sit up as far as you can in bed or on a chair. Hold the incentive spirometer in an upright position. Breathe out normally. Place the mouthpiece in your mouth and seal your lips tightly around it. Breathe in slowly and as deeply as possible, raising the piston or the ball toward the top of the column. Hold your breath for 3-5 seconds or for as long as possible. Allow the piston or ball to fall to the bottom of the column. Remove the mouthpiece from your mouth and breathe out normally. Rest for a  few seconds and repeat Steps 1 through 7 at least 10 times every 1-2 hours when you are awake. Take your time and take a few normal breaths between deep breaths. The spirometer may include an indicator to show your best effort. Use the indicator as a goal to work toward during each repetition. After each set of 10 deep breaths, practice coughing to be sure your lungs are clear. If you have an incision (the cut made at the time of surgery), support your incision when coughing by placing a pillow or rolled up towels firmly against it. Once you are able to get out of bed, walk around indoors and cough well. You may stop using the incentive spirometer when instructed by your caregiver.  RISKS AND COMPLICATIONS Take your time so you do not get dizzy or light-headed. If you are in pain, you may need to take or ask for pain medication before doing incentive spirometry. It is harder to take a deep breath if you are having pain. AFTER USE Rest and breathe slowly and easily. It can be helpful to keep track of a log of your progress. Your caregiver can provide you with a simple table to help with this. If you are using the spirometer at home, follow these instructions: [place] IF:  You are having difficultly using the spirometer. You have trouble using the spirometer as often as instructed. Your pain medication is not giving enough relief while using the spirometer. You develop fever of 100.5 F (38.1 C) or higher.  SEEK IMMEDIATE MEDICAL CARE IF:  You cough up bloody sputum that had not been present before. You develop fever of 102 F (38.9 C) or greater. You develop worsening pain at or near the incision site. MAKE SURE YOU:  Understand these instructions. Will watch your condition. Will get help right away if you are not doing well or get worse. Document Released: 06/03/2006 Document Revised:  04/15/2011 Document Reviewed: 08/04/2006 [facility] Patient Information 2014 [place], [place].

## 2021-11-09 ENCOUNTER — Encounter (HOSPITAL_COMMUNITY): Payer: Self-pay

## 2021-11-09 ENCOUNTER — Encounter (HOSPITAL_COMMUNITY)
Admission: RE | Admit: 2021-11-09 | Discharge: 2021-11-09 | Disposition: A | Discharge status: Home / Self Care | Payer: Medicare Other | Source: Ambulatory Visit | Attending: Surgery | Admitting: Surgery

## 2021-11-09 ENCOUNTER — Other Ambulatory Visit: Payer: Self-pay

## 2021-11-09 VITALS — BP 132/64 | HR 76 | Temp 98.1°F | Resp 16 | Ht 65.0 in | Wt 153.0 lb

## 2021-11-09 DIAGNOSIS — E109 Type 1 diabetes mellitus without complications: Secondary | ICD-10-CM | POA: Insufficient documentation

## 2021-11-09 DIAGNOSIS — I10 Essential (primary) hypertension: Secondary | ICD-10-CM | POA: Insufficient documentation

## 2021-11-09 DIAGNOSIS — Z01818 Encounter for other preprocedural examination: Secondary | ICD-10-CM | POA: Insufficient documentation

## 2021-11-09 LAB — BASIC METABOLIC PANEL
Anion gap: 6 (ref 5–15)
BUN: 26 mg/dL — ABNORMAL HIGH (ref 8–23)
CO2: 29 mmol/L (ref 22–32)
Calcium: 10.2 mg/dL (ref 8.9–10.3)
Chloride: 101 mmol/L (ref 98–111)
Creatinine, Ser: 0.8 mg/dL (ref 0.44–1.00)
GFR, Estimated: >60 mL/min (ref >60)
Glucose, Bld: 102 mg/dL — ABNORMAL HIGH (ref 70–99)
Potassium: 4.4 mmol/L (ref 3.5–5.1)
Sodium: 136 mmol/L (ref 135–145)

## 2021-11-09 LAB — CBC
HCT: 36.3 % (ref 36.0–46.0)
Hemoglobin: 12.1 g/dL (ref 12.0–15.0)
MCH: 31.7 pg (ref 26.0–34.0)
MCHC: 33.3 g/dL (ref 30.0–36.0)
MCV: 95 fL (ref 80.0–100.0)
Platelets: 235 10*3/uL (ref 150–400)
RBC: 3.82 MIL/uL — ABNORMAL LOW (ref 3.87–5.11)
RDW: 12.5 % (ref 11.5–15.5)
WBC: 5.9 10*3/uL (ref 4.0–10.5)
nRBC: 0 % (ref 0.0–0.2)

## 2021-11-09 NOTE — Consult Note (Signed)
[place] Nurse requested for preoperative stoma site marking, "just in case" an ostomy is performed during surgery.  Discussed surgical procedure and stoma creation with patient. Explained role of the [place] nurse team.  Provided the patient with educational booklet and provided samples of pouching options.  Answered patient's questions.   Examined patient sitting, and standing in order to place the marking in the patient's visual field, away from any creases or abdominal contour issues and within the rectus muscle.  Attempted to mark below the patient's belt line., but this was not possible since a significant crease occurs lower on the abd when the patient leans forward and should be avoided if possible.    Marked for colostomy in the LLQ  _8___ cm to the left of the umbilicus and __6__YI below the umbilicus.  Marked for ileostomy in the RLQ  __8__cm to the right of the umbilicus and  __9__ cm below the umbilicus.  Patient's abdomen cleansed with CHG wipes at site markings, allowed to air dry prior to marking.Covered mark with thin film transparent dressing to preserve mark until date of surgery.   [place] Nurse team will follow up with patient after surgery for continued ostomy care and teaching if patient recieves an ostomy.  Please re-consult if further assistance is needed.  Thank-you,  Julien Girt MSN, [place], [place], [place], [place]

## 2021-11-13 NOTE — Anesthesia Preprocedure Evaluation (Addendum)
Anesthesia Evaluation  Patient identified by MRN, date of birth, ID band Patient awake    Reviewed: Allergy & Precautions, NPO status , Patient's Chart, lab work & pertinent test results  History of Anesthesia Complications (+) PONV and history of anesthetic complications  Airway Mallampati: II  TM Distance: >3 FB Neck ROM: Full    Dental no notable dental hx.    Pulmonary neg pulmonary ROS, former smoker,    Pulmonary exam normal        Cardiovascular hypertension, Pt. on medications  Rhythm:Regular Rate:Normal     Neuro/Psych Anxiety Depression negative neurological ROS     GI/Hepatic Neg liver ROS, GERD  ,Rectal prolapse   Endo/Other  diabetes, Well Controlled, Type 1, Insulin Dependent  Renal/GU negative Renal ROS  negative genitourinary   Musculoskeletal  (+) Arthritis , Osteoarthritis,    Abdominal Normal abdominal exam  (+)   Peds  Hematology negative hematology ROS (+)   Anesthesia Other Findings   Reproductive/Obstetrics                            Anesthesia Physical Anesthesia Plan  ASA: 2  Anesthesia Plan: General   Post-op Pain Management: Celebrex PO (pre-op)* and Tylenol PO (pre-op)*   Induction: Intravenous  PONV Risk Score and Plan: 4 or greater and Ondansetron, Aprepitant, Dexamethasone, Midazolam, Treatment may vary due to age or medical condition and Amisulpride  Airway Management Planned: Mask and Oral ETT  Additional Equipment: None  Intra-op Plan:   Post-operative Plan: Extubation in OR  Informed Consent: I have reviewed the patients History and Physical, chart, labs and discussed the procedure including the risks, benefits and alternatives for the proposed anesthesia with the patient or authorized representative who has indicated his/her understanding and acceptance.     Dental advisory given  Plan Discussed with: CRNA  Anesthesia Plan Comments:  (Lab Results      Component                Value               Date                      WBC                      5.9                 11/09/2021                HGB                      12.1                11/09/2021                HCT                      36.3                11/09/2021                MCV                      95.0                11/09/2021  PLT                      235                 11/09/2021           Lab Results      Component                Value               Date                      NA                       136                 11/09/2021                K                        4.4                 11/09/2021                CO2                      29                  11/09/2021                GLUCOSE                  102 (H)             11/09/2021                BUN                      26 (H)              11/09/2021                CREATININE               0.80                11/09/2021                CALCIUM                  10.2                11/09/2021                GFRNONAA                 >60                 11/09/2021          )       Anesthesia Quick Evaluation

## 2021-11-14 ENCOUNTER — Other Ambulatory Visit (HOSPITAL_COMMUNITY): Payer: Self-pay

## 2021-11-14 ENCOUNTER — Encounter (HOSPITAL_COMMUNITY)
Admission: RE | Disposition: A | Discharge status: Home / Self Care | Payer: Self-pay | Source: Home / Self Care | Attending: Surgery

## 2021-11-14 ENCOUNTER — Inpatient Hospital Stay (HOSPITAL_COMMUNITY): Payer: Medicare Other | Admitting: Physician Assistant

## 2021-11-14 ENCOUNTER — Encounter (HOSPITAL_COMMUNITY): Payer: Self-pay | Admitting: Surgery

## 2021-11-14 ENCOUNTER — Inpatient Hospital Stay (HOSPITAL_COMMUNITY)
Admission: RE | Admit: 2021-11-14 | Discharge: 2021-11-17 | DRG: 330 | Disposition: A | Discharge status: Home / Self Care | Payer: Medicare Other | Attending: Surgery | Admitting: Surgery

## 2021-11-14 ENCOUNTER — Other Ambulatory Visit: Payer: Self-pay

## 2021-11-14 ENCOUNTER — Inpatient Hospital Stay (HOSPITAL_COMMUNITY): Payer: Medicare Other | Admitting: Anesthesiology

## 2021-11-14 DIAGNOSIS — Z794 Long term (current) use of insulin: Secondary | ICD-10-CM | POA: Diagnosis not present

## 2021-11-14 DIAGNOSIS — E109 Type 1 diabetes mellitus without complications: Secondary | ICD-10-CM | POA: Diagnosis not present

## 2021-11-14 DIAGNOSIS — E860 Dehydration: Secondary | ICD-10-CM | POA: Diagnosis not present

## 2021-11-14 DIAGNOSIS — E1042 Type 1 diabetes mellitus with diabetic polyneuropathy: Secondary | ICD-10-CM | POA: Diagnosis present

## 2021-11-14 DIAGNOSIS — Z83438 Family history of other disorder of lipoprotein metabolism and other lipidemia: Secondary | ICD-10-CM

## 2021-11-14 DIAGNOSIS — D509 Iron deficiency anemia, unspecified: Secondary | ICD-10-CM | POA: Diagnosis present

## 2021-11-14 DIAGNOSIS — E782 Mixed hyperlipidemia: Secondary | ICD-10-CM | POA: Diagnosis present

## 2021-11-14 DIAGNOSIS — Z87891 Personal history of nicotine dependence: Secondary | ICD-10-CM

## 2021-11-14 DIAGNOSIS — M5412 Radiculopathy, cervical region: Secondary | ICD-10-CM | POA: Diagnosis present

## 2021-11-14 DIAGNOSIS — D62 Acute posthemorrhagic anemia: Secondary | ICD-10-CM | POA: Diagnosis not present

## 2021-11-14 DIAGNOSIS — K5909 Other constipation: Secondary | ICD-10-CM | POA: Diagnosis present

## 2021-11-14 DIAGNOSIS — I119 Hypertensive heart disease without heart failure: Secondary | ICD-10-CM | POA: Diagnosis present

## 2021-11-14 DIAGNOSIS — M419 Scoliosis, unspecified: Secondary | ICD-10-CM | POA: Diagnosis present

## 2021-11-14 DIAGNOSIS — I10 Essential (primary) hypertension: Secondary | ICD-10-CM | POA: Diagnosis not present

## 2021-11-14 DIAGNOSIS — N393 Stress incontinence (female) (male): Secondary | ICD-10-CM | POA: Diagnosis present

## 2021-11-14 DIAGNOSIS — M069 Rheumatoid arthritis, unspecified: Secondary | ICD-10-CM | POA: Diagnosis present

## 2021-11-14 DIAGNOSIS — Z9641 Presence of insulin pump (external) (internal): Secondary | ICD-10-CM

## 2021-11-14 DIAGNOSIS — J309 Allergic rhinitis, unspecified: Secondary | ICD-10-CM | POA: Diagnosis present

## 2021-11-14 DIAGNOSIS — Z823 Family history of stroke: Secondary | ICD-10-CM

## 2021-11-14 DIAGNOSIS — E1065 Type 1 diabetes mellitus with hyperglycemia: Secondary | ICD-10-CM | POA: Diagnosis not present

## 2021-11-14 DIAGNOSIS — Q438 Other specified congenital malformations of intestine: Secondary | ICD-10-CM | POA: Diagnosis not present

## 2021-11-14 DIAGNOSIS — Z8249 Family history of ischemic heart disease and other diseases of the circulatory system: Secondary | ICD-10-CM | POA: Diagnosis not present

## 2021-11-14 DIAGNOSIS — R159 Full incontinence of feces: Secondary | ICD-10-CM | POA: Diagnosis present

## 2021-11-14 DIAGNOSIS — Z9181 History of falling: Secondary | ICD-10-CM

## 2021-11-14 DIAGNOSIS — F4322 Adjustment disorder with anxiety: Secondary | ICD-10-CM | POA: Diagnosis present

## 2021-11-14 DIAGNOSIS — Z881 Allergy status to other antibiotic agents status: Secondary | ICD-10-CM

## 2021-11-14 DIAGNOSIS — Z981 Arthrodesis status: Secondary | ICD-10-CM | POA: Diagnosis not present

## 2021-11-14 DIAGNOSIS — N39498 Other specified urinary incontinence: Secondary | ICD-10-CM | POA: Diagnosis present

## 2021-11-14 DIAGNOSIS — K660 Peritoneal adhesions (postprocedural) (postinfection): Secondary | ICD-10-CM | POA: Diagnosis present

## 2021-11-14 DIAGNOSIS — K529 Noninfective gastroenteritis and colitis, unspecified: Secondary | ICD-10-CM | POA: Diagnosis present

## 2021-11-14 DIAGNOSIS — M1990 Unspecified osteoarthritis, unspecified site: Secondary | ICD-10-CM | POA: Diagnosis present

## 2021-11-14 DIAGNOSIS — F32A Depression, unspecified: Secondary | ICD-10-CM | POA: Diagnosis present

## 2021-11-14 DIAGNOSIS — Z79899 Other long term (current) drug therapy: Secondary | ICD-10-CM | POA: Diagnosis not present

## 2021-11-14 DIAGNOSIS — K219 Gastro-esophageal reflux disease without esophagitis: Secondary | ICD-10-CM | POA: Diagnosis present

## 2021-11-14 DIAGNOSIS — Z888 Allergy status to other drugs, medicaments and biological substances status: Secondary | ICD-10-CM

## 2021-11-14 DIAGNOSIS — H409 Unspecified glaucoma: Secondary | ICD-10-CM | POA: Diagnosis present

## 2021-11-14 DIAGNOSIS — F419 Anxiety disorder, unspecified: Secondary | ICD-10-CM | POA: Diagnosis present

## 2021-11-14 DIAGNOSIS — K623 Rectal prolapse: Secondary | ICD-10-CM

## 2021-11-14 DIAGNOSIS — Z7982 Long term (current) use of aspirin: Secondary | ICD-10-CM

## 2021-11-14 DIAGNOSIS — F4320 Adjustment disorder, unspecified: Secondary | ICD-10-CM | POA: Diagnosis present

## 2021-11-14 HISTORY — PX: RECTOPEXY: SHX5700

## 2021-11-14 HISTORY — PX: PROCTOSCOPY: SHX2266

## 2021-11-14 LAB — GLUCOSE, CAPILLARY
Glucose-Capillary: 129 mg/dL — ABNORMAL HIGH (ref 70–99)
Glucose-Capillary: 116 mg/dL — ABNORMAL HIGH (ref 70–99)
Glucose-Capillary: 192 mg/dL — ABNORMAL HIGH (ref 70–99)
Glucose-Capillary: 227 mg/dL — ABNORMAL HIGH (ref 70–99)
Glucose-Capillary: 187 mg/dL — ABNORMAL HIGH (ref 70–99)
Glucose-Capillary: 199 mg/dL — ABNORMAL HIGH (ref 70–99)
Glucose-Capillary: 157 mg/dL — ABNORMAL HIGH (ref 70–99)
Glucose-Capillary: 178 mg/dL — ABNORMAL HIGH (ref 70–99)

## 2021-11-14 SURGERY — COLECTOMY, SIGMOID, ROBOT-ASSISTED
Anesthesia: General | Site: Abdomen

## 2021-11-14 MED ORDER — CHLORHEXIDINE GLUCONATE CLOTH 2 % EX PADS: 6.0000 | MEDICATED_PAD | Freq: Once | CUTANEOUS | Status: DC

## 2021-11-14 MED ORDER — ONDANSETRON HCL 4 MG/2ML IJ SOLN: 4.0000 mg | Freq: Four times a day (QID) | INTRAMUSCULAR | Status: DC | PRN

## 2021-11-14 MED ORDER — SODIUM CHLORIDE 0.9 % IV SOLN: 250.0000 mL | INTRAVENOUS | Status: DC | PRN

## 2021-11-14 MED ORDER — SIMETHICONE 80 MG PO CHEW: 40.0000 mg | CHEWABLE_TABLET | Freq: Four times a day (QID) | ORAL | Status: DC | PRN

## 2021-11-14 MED ORDER — SUGAMMADEX SODIUM 200 MG/2ML IV SOLN
INTRAVENOUS | Status: DC | PRN
  Administered 2021-11-14: 200 mg via INTRAVENOUS

## 2021-11-14 MED ORDER — ZINC SULFATE 220 (50 ZN) MG PO CAPS
220.0000 mg | ORAL_CAPSULE | Freq: Every day | ORAL | Status: DC
  Administered 2021-11-15 – 2021-11-17 (×3): 220 mg via ORAL
  Filled 2021-11-14 (×3): qty 1

## 2021-11-14 MED ORDER — NEOMYCIN SULFATE 500 MG PO TABS
1000.0000 mg | ORAL_TABLET | ORAL | Status: DC
  Filled 2021-11-14: qty 2

## 2021-11-14 MED ORDER — PROCHLORPERAZINE EDISYLATE 10 MG/2ML IJ SOLN: 5.0000 mg | Freq: Four times a day (QID) | INTRAMUSCULAR | Status: DC | PRN

## 2021-11-14 MED ORDER — APREPITANT 40 MG PO CAPS
40.0000 mg | ORAL_CAPSULE | Freq: Once | ORAL | Status: AC
  Administered 2021-11-14: 40 mg via ORAL
  Filled 2021-11-14: qty 1

## 2021-11-14 MED ORDER — ALVIMOPAN 12 MG PO CAPS
12.0000 mg | ORAL_CAPSULE | ORAL | Status: AC
  Administered 2021-11-14: 12 mg via ORAL
  Filled 2021-11-14: qty 1

## 2021-11-14 MED ORDER — TRAMADOL HCL 50 MG PO TABS: 50.0000 mg | ORAL_TABLET | Freq: Four times a day (QID) | ORAL | Status: DC | PRN

## 2021-11-14 MED ORDER — ONDANSETRON HCL 4 MG PO TABS: 4.0000 mg | ORAL_TABLET | Freq: Four times a day (QID) | ORAL | Status: DC | PRN

## 2021-11-14 MED ORDER — TRAMADOL HCL 50 MG PO TABS
50.0000 mg | ORAL_TABLET | Freq: Four times a day (QID) | ORAL | 0 refills | Status: DC | PRN
  Filled 2021-11-14: qty 20, 3d supply, fill #0

## 2021-11-14 MED ORDER — HYDROMORPHONE HCL 1 MG/ML IJ SOLN: 0.5000 mg | INTRAMUSCULAR | Status: DC | PRN

## 2021-11-14 MED ORDER — ENOXAPARIN SODIUM 40 MG/0.4ML IJ SOSY
40.0000 mg | PREFILLED_SYRINGE | Freq: Once | INTRAMUSCULAR | Status: AC
  Administered 2021-11-14: 40 mg via SUBCUTANEOUS
  Filled 2021-11-14: qty 0.4

## 2021-11-14 MED ORDER — SUCCINYLCHOLINE CHLORIDE 200 MG/10ML IV SOSY
PREFILLED_SYRINGE | INTRAVENOUS | Status: DC | PRN
  Administered 2021-11-14: 100 mg via INTRAVENOUS

## 2021-11-14 MED ORDER — ROCURONIUM BROMIDE 10 MG/ML (PF) SYRINGE
PREFILLED_SYRINGE | INTRAVENOUS | Status: DC | PRN
  Administered 2021-11-14: 40 mg via INTRAVENOUS
  Administered 2021-11-14: 20 mg via INTRAVENOUS

## 2021-11-14 MED ORDER — CELECOXIB 200 MG PO CAPS
200.0000 mg | ORAL_CAPSULE | Freq: Once | ORAL | Status: AC
  Administered 2021-11-14: 200 mg via ORAL
  Filled 2021-11-14: qty 1

## 2021-11-14 MED ORDER — INSULIN ASPART 100 UNIT/ML IJ SOLN: 0.0000 [IU] | Freq: Every day | INTRAMUSCULAR | Status: DC

## 2021-11-14 MED ORDER — BISACODYL 5 MG PO TBEC: 20.0000 mg | DELAYED_RELEASE_TABLET | Freq: Once | ORAL | Status: DC

## 2021-11-14 MED ORDER — CALCIUM POLYCARBOPHIL 625 MG PO TABS
625.0000 mg | ORAL_TABLET | Freq: Two times a day (BID) | ORAL | Status: DC
  Administered 2021-11-14 – 2021-11-17 (×6): 625 mg via ORAL
  Filled 2021-11-14 (×7): qty 1

## 2021-11-14 MED ORDER — PROCHLORPERAZINE MALEATE 10 MG PO TABS: 10.0000 mg | ORAL_TABLET | Freq: Four times a day (QID) | ORAL | Status: DC | PRN

## 2021-11-14 MED ORDER — LACTATED RINGERS IV SOLN: INTRAVENOUS | Status: DC

## 2021-11-14 MED ORDER — METHOCARBAMOL 500 MG PO TABS
1000.0000 mg | ORAL_TABLET | Freq: Four times a day (QID) | ORAL | Status: DC | PRN
  Administered 2021-11-15: 500 mg via ORAL
  Administered 2021-11-15: 1000 mg via ORAL
  Filled 2021-11-14 (×3): qty 2

## 2021-11-14 MED ORDER — LIP MEDEX EX OINT
TOPICAL_OINTMENT | Freq: Two times a day (BID) | CUTANEOUS | Status: DC
  Administered 2021-11-14: 75 via TOPICAL
  Administered 2021-11-15: 1 via TOPICAL
  Administered 2021-11-17: 75 via TOPICAL
  Filled 2021-11-14 (×2): qty 7

## 2021-11-14 MED ORDER — PROPOFOL 10 MG/ML IV BOLUS
INTRAVENOUS | Status: DC | PRN
  Administered 2021-11-14: 140 mg via INTRAVENOUS

## 2021-11-14 MED ORDER — LACTATED RINGERS IV BOLUS: 1000.0000 mL | Freq: Three times a day (TID) | INTRAVENOUS | Status: AC | PRN

## 2021-11-14 MED ORDER — BUPIVACAINE-EPINEPHRINE (PF) 0.25% -1:200000 IJ SOLN
INTRAMUSCULAR | Status: DC | PRN
  Administered 2021-11-14: 60 mL

## 2021-11-14 MED ORDER — SUCCINYLCHOLINE CHLORIDE 200 MG/10ML IV SOSY
PREFILLED_SYRINGE | INTRAVENOUS | Status: AC
  Filled 2021-11-14: qty 10

## 2021-11-14 MED ORDER — SODIUM CHLORIDE 0.9 % IV SOLN
2.0000 g | Freq: Two times a day (BID) | INTRAVENOUS | Status: AC
  Administered 2021-11-14: 2 g via INTRAVENOUS
  Filled 2021-11-14: qty 2

## 2021-11-14 MED ORDER — SIMETHICONE 40 MG/0.6ML PO SUSP: 80.0000 mg | Freq: Four times a day (QID) | ORAL | Status: DC | PRN

## 2021-11-14 MED ORDER — INSULIN ASPART 100 UNIT/ML IJ SOLN
INTRAMUSCULAR | Status: AC
  Filled 2021-11-14: qty 1

## 2021-11-14 MED ORDER — ENALAPRILAT 1.25 MG/ML IV SOLN: 0.6250 mg | Freq: Four times a day (QID) | INTRAVENOUS | Status: DC | PRN

## 2021-11-14 MED ORDER — ALUM & MAG HYDROXIDE-SIMETH 200-200-20 MG/5ML PO SUSP: 30.0000 mL | Freq: Four times a day (QID) | ORAL | Status: DC | PRN

## 2021-11-14 MED ORDER — ONDANSETRON HCL 4 MG/2ML IJ SOLN
INTRAMUSCULAR | Status: AC
  Filled 2021-11-14: qty 2

## 2021-11-14 MED ORDER — FENTANYL CITRATE PF 50 MCG/ML IJ SOSY: 25.0000 ug | PREFILLED_SYRINGE | INTRAMUSCULAR | Status: DC | PRN

## 2021-11-14 MED ORDER — ALVIMOPAN 12 MG PO CAPS
12.0000 mg | ORAL_CAPSULE | Freq: Two times a day (BID) | ORAL | Status: DC
  Administered 2021-11-15: 12 mg via ORAL
  Filled 2021-11-14 (×2): qty 1

## 2021-11-14 MED ORDER — HYDRALAZINE HCL 20 MG/ML IJ SOLN: 10.0000 mg | INTRAMUSCULAR | Status: DC | PRN

## 2021-11-14 MED ORDER — ADULT MULTIVITAMIN W/MINERALS CH
1.0000 | ORAL_TABLET | Freq: Every day | ORAL | Status: DC
  Administered 2021-11-15 – 2021-11-17 (×3): 1 via ORAL
  Filled 2021-11-14 (×4): qty 1

## 2021-11-14 MED ORDER — 0.9 % SODIUM CHLORIDE (POUR BTL) OPTIME
TOPICAL | Status: DC | PRN
  Administered 2021-11-14: 2000 mL

## 2021-11-14 MED ORDER — DIPHENHYDRAMINE HCL 12.5 MG/5ML PO ELIX: 12.5000 mg | ORAL_SOLUTION | Freq: Four times a day (QID) | ORAL | Status: DC | PRN

## 2021-11-14 MED ORDER — METRONIDAZOLE 500 MG PO TABS
1000.0000 mg | ORAL_TABLET | ORAL | Status: DC
  Filled 2021-11-14: qty 2

## 2021-11-14 MED ORDER — LIDOCAINE HCL (PF) 2 % IJ SOLN
INTRAMUSCULAR | Status: DC | PRN
  Administered 2021-11-14: 1.5 mg/kg/h via INTRADERMAL

## 2021-11-14 MED ORDER — CHLORHEXIDINE GLUCONATE 0.12 % MT SOLN
15.0000 mL | Freq: Once | OROMUCOSAL | Status: AC
  Administered 2021-11-14: 15 mL via OROMUCOSAL

## 2021-11-14 MED ORDER — GABAPENTIN 300 MG PO CAPS
300.0000 mg | ORAL_CAPSULE | ORAL | Status: AC
  Administered 2021-11-14: 300 mg via ORAL
  Filled 2021-11-14: qty 1

## 2021-11-14 MED ORDER — VENLAFAXINE HCL ER 37.5 MG PO CP24
75.0000 mg | ORAL_CAPSULE | Freq: Every day | ORAL | Status: DC
  Administered 2021-11-15 – 2021-11-17 (×3): 75 mg via ORAL
  Filled 2021-11-14 (×3): qty 2

## 2021-11-14 MED ORDER — SODIUM CHLORIDE 0.9% FLUSH: 3.0000 mL | INTRAVENOUS | Status: DC | PRN

## 2021-11-14 MED ORDER — SODIUM CHLORIDE 0.9% FLUSH
3.0000 mL | Freq: Two times a day (BID) | INTRAVENOUS | Status: DC
  Administered 2021-11-14 – 2021-11-17 (×4): 3 mL via INTRAVENOUS

## 2021-11-14 MED ORDER — DEXAMETHASONE SODIUM PHOSPHATE 10 MG/ML IJ SOLN
INTRAMUSCULAR | Status: DC | PRN
  Administered 2021-11-14: 4 mg via INTRAVENOUS

## 2021-11-14 MED ORDER — VENLAFAXINE HCL 75 MG PO TABS: 75.0000 mg | ORAL_TABLET | Freq: Every day | ORAL | Status: DC

## 2021-11-14 MED ORDER — ACETAMINOPHEN 10 MG/ML IV SOLN
INTRAVENOUS | Status: AC
  Filled 2021-11-14: qty 100

## 2021-11-14 MED ORDER — ENSURE PRE-SURGERY PO LIQD
592.0000 mL | Freq: Once | ORAL | Status: DC
  Filled 2021-11-14: qty 592

## 2021-11-14 MED ORDER — PHENYLEPHRINE HCL-NACL 20-0.9 MG/250ML-% IV SOLN
INTRAVENOUS | Status: DC | PRN
  Administered 2021-11-14: 20 ug/min via INTRAVENOUS

## 2021-11-14 MED ORDER — PROPOFOL 500 MG/50ML IV EMUL
INTRAVENOUS | Status: AC
  Filled 2021-11-14: qty 50

## 2021-11-14 MED ORDER — SODIUM CHLORIDE 0.9 % IV SOLN
2.0000 g | INTRAVENOUS | Status: AC
  Administered 2021-11-14: 2 g via INTRAVENOUS
  Filled 2021-11-14: qty 2

## 2021-11-14 MED ORDER — MELATONIN 3 MG PO TABS
3.0000 mg | ORAL_TABLET | Freq: Every evening | ORAL | Status: DC | PRN
  Filled 2021-11-14: qty 1

## 2021-11-14 MED ORDER — PHENYLEPHRINE HCL-NACL 20-0.9 MG/250ML-% IV SOLN
INTRAVENOUS | Status: AC
  Filled 2021-11-14: qty 250

## 2021-11-14 MED ORDER — BUPIVACAINE LIPOSOME 1.3 % IJ SUSP: 20.0000 mL | Freq: Once | INTRAMUSCULAR | Status: DC

## 2021-11-14 MED ORDER — ACETAMINOPHEN 500 MG PO TABS
1000.0000 mg | ORAL_TABLET | Freq: Once | ORAL | Status: AC
  Administered 2021-11-14: 1000 mg via ORAL

## 2021-11-14 MED ORDER — ENOXAPARIN SODIUM 40 MG/0.4ML IJ SOSY
40.0000 mg | PREFILLED_SYRINGE | INTRAMUSCULAR | Status: DC
  Administered 2021-11-15: 40 mg via SUBCUTANEOUS
  Filled 2021-11-14: qty 0.4

## 2021-11-14 MED ORDER — OYSTER SHELL CALCIUM/D3 500-5 MG-MCG PO TABS
1.0000 | ORAL_TABLET | Freq: Two times a day (BID) | ORAL | Status: DC
  Administered 2021-11-15 – 2021-11-17 (×5): 1 via ORAL
  Filled 2021-11-14 (×5): qty 1

## 2021-11-14 MED ORDER — ASPIRIN 81 MG PO TBEC
81.0000 mg | DELAYED_RELEASE_TABLET | Freq: Every day | ORAL | Status: DC
  Administered 2021-11-15 – 2021-11-17 (×3): 81 mg via ORAL
  Filled 2021-11-14 (×4): qty 1

## 2021-11-14 MED ORDER — LORATADINE 10 MG PO TABS
10.0000 mg | ORAL_TABLET | Freq: Every day | ORAL | Status: DC
  Administered 2021-11-15 – 2021-11-17 (×3): 10 mg via ORAL
  Filled 2021-11-14 (×4): qty 1

## 2021-11-14 MED ORDER — INSULIN ASPART 100 UNIT/ML IJ SOLN
0.0000 [IU] | Freq: Three times a day (TID) | INTRAMUSCULAR | Status: DC
  Administered 2021-11-14: 3 [IU] via SUBCUTANEOUS
  Administered 2021-11-15: 11 [IU] via SUBCUTANEOUS
  Administered 2021-11-15 – 2021-11-17 (×3): 8 [IU] via SUBCUTANEOUS

## 2021-11-14 MED ORDER — FENTANYL CITRATE (PF) 250 MCG/5ML IJ SOLN
INTRAMUSCULAR | Status: DC | PRN
  Administered 2021-11-14: 50 ug via INTRAVENOUS
  Administered 2021-11-14: 100 ug via INTRAVENOUS
  Administered 2021-11-14 (×2): 50 ug via INTRAVENOUS

## 2021-11-14 MED ORDER — BUPIVACAINE LIPOSOME 1.3 % IJ SUSP
INTRAMUSCULAR | Status: DC | PRN
  Administered 2021-11-14: 20 mL

## 2021-11-14 MED ORDER — ENSURE PRE-SURGERY PO LIQD
296.0000 mL | Freq: Once | ORAL | Status: DC
  Filled 2021-11-14: qty 296

## 2021-11-14 MED ORDER — BUPIVACAINE LIPOSOME 1.3 % IJ SUSP
INTRAMUSCULAR | Status: AC
  Filled 2021-11-14: qty 20

## 2021-11-14 MED ORDER — INSULIN ASPART 100 UNIT/ML IJ SOLN
INTRAMUSCULAR | Status: DC | PRN
  Administered 2021-11-14: 10 [IU] via SUBCUTANEOUS

## 2021-11-14 MED ORDER — ATORVASTATIN CALCIUM 10 MG PO TABS
10.0000 mg | ORAL_TABLET | Freq: Every day | ORAL | Status: DC
  Administered 2021-11-14 – 2021-11-16 (×3): 10 mg via ORAL
  Filled 2021-11-14 (×3): qty 1

## 2021-11-14 MED ORDER — ROCURONIUM BROMIDE 10 MG/ML (PF) SYRINGE
PREFILLED_SYRINGE | INTRAVENOUS | Status: AC
  Filled 2021-11-14: qty 10

## 2021-11-14 MED ORDER — LIDOCAINE HCL (PF) 2 % IJ SOLN
INTRAMUSCULAR | Status: AC
  Filled 2021-11-14: qty 5

## 2021-11-14 MED ORDER — AMISULPRIDE (ANTIEMETIC) 5 MG/2ML IV SOLN: 10.0000 mg | Freq: Once | INTRAVENOUS | Status: DC | PRN

## 2021-11-14 MED ORDER — ONDANSETRON HCL 4 MG/2ML IJ SOLN
INTRAMUSCULAR | Status: DC | PRN
  Administered 2021-11-14 (×2): 4 mg via INTRAVENOUS

## 2021-11-14 MED ORDER — ENSURE SURGERY PO LIQD
237.0000 mL | Freq: Two times a day (BID) | ORAL | Status: DC
  Administered 2021-11-15: 237 mL via ORAL

## 2021-11-14 MED ORDER — MAGIC MOUTHWASH: 15.0000 mL | Freq: Four times a day (QID) | ORAL | Status: DC | PRN

## 2021-11-14 MED ORDER — METOPROLOL TARTRATE 5 MG/5ML IV SOLN: 5.0000 mg | Freq: Four times a day (QID) | INTRAVENOUS | Status: DC | PRN

## 2021-11-14 MED ORDER — ORAL CARE MOUTH RINSE: 15.0000 mL | Freq: Once | OROMUCOSAL | Status: AC

## 2021-11-14 MED ORDER — FENTANYL CITRATE (PF) 250 MCG/5ML IJ SOLN
INTRAMUSCULAR | Status: AC
  Filled 2021-11-14: qty 5

## 2021-11-14 MED ORDER — DIPHENHYDRAMINE HCL 50 MG/ML IJ SOLN: 12.5000 mg | Freq: Four times a day (QID) | INTRAMUSCULAR | Status: DC | PRN

## 2021-11-14 MED ORDER — ACETAMINOPHEN 500 MG PO TABS
1000.0000 mg | ORAL_TABLET | Freq: Four times a day (QID) | ORAL | Status: DC
  Administered 2021-11-14 – 2021-11-17 (×10): 1000 mg via ORAL
  Filled 2021-11-14 (×11): qty 2

## 2021-11-14 MED ORDER — DEXAMETHASONE SODIUM PHOSPHATE 10 MG/ML IJ SOLN
INTRAMUSCULAR | Status: AC
  Filled 2021-11-14: qty 1

## 2021-11-14 MED ORDER — LACTATED RINGERS IR SOLN
Status: DC | PRN
  Administered 2021-11-14: 1000 mL

## 2021-11-14 MED ORDER — BUPIVACAINE-EPINEPHRINE (PF) 0.25% -1:200000 IJ SOLN
INTRAMUSCULAR | Status: AC
  Filled 2021-11-14: qty 60

## 2021-11-14 MED ORDER — METHOCARBAMOL 1000 MG/10ML IJ SOLN: 1000.0000 mg | Freq: Four times a day (QID) | INTRAVENOUS | Status: DC | PRN

## 2021-11-14 MED ORDER — ACETAMINOPHEN 500 MG PO TABS
1000.0000 mg | ORAL_TABLET | ORAL | Status: DC
  Filled 2021-11-14: qty 2

## 2021-11-14 MED ORDER — POLYETHYLENE GLYCOL 3350 17 GM/SCOOP PO POWD
1.0000 | Freq: Once | ORAL | Status: DC
  Filled 2021-11-14: qty 255

## 2021-11-14 MED ORDER — LACTATED RINGERS IV SOLN: INTRAVENOUS | Status: DC | PRN

## 2021-11-14 MED ORDER — LIDOCAINE 2% (20 MG/ML) 5 ML SYRINGE
INTRAMUSCULAR | Status: DC | PRN
  Administered 2021-11-14: 60 mg via INTRAVENOUS

## 2021-11-14 SURGICAL SUPPLY — 113 items
APPLIER CLIP 5 13 M/L LIGAMAX5 (MISCELLANEOUS) ×2
APPLIER CLIP ROT 10 11.4 M/L (STAPLE)
BAG COUNTER SPONGE SURGICOUNT (BAG) ×2 IMPLANT
BLADE EXTENDED COATED 6.5IN (ELECTRODE) IMPLANT
CANNULA REDUC XI 12-8 STAPL (CANNULA) ×2
CANNULA REDUCER 12-8 DVNC XI (CANNULA) IMPLANT
CELLS DAT CNTRL 66122 CELL SVR (MISCELLANEOUS) IMPLANT
CHLORAPREP W/TINT 26 (MISCELLANEOUS) IMPLANT
CLIP APPLIE 5 13 M/L LIGAMAX5 (MISCELLANEOUS) IMPLANT
CLIP APPLIE ROT 10 11.4 M/L (STAPLE) IMPLANT
COVER SURGICAL LIGHT HANDLE (MISCELLANEOUS) ×4 IMPLANT
COVER TIP SHEARS 8 DVNC (MISCELLANEOUS) ×2 IMPLANT
COVER TIP SHEARS 8MM DA VINCI (MISCELLANEOUS) ×2
DEVICE TROCAR PUNCTURE CLOSURE (ENDOMECHANICALS) IMPLANT
DRAIN CHANNEL 19F RND (DRAIN) IMPLANT
DRAPE ARM DVNC X/XI (DISPOSABLE) ×8 IMPLANT
DRAPE COLUMN DVNC XI (DISPOSABLE) ×2 IMPLANT
DRAPE DA VINCI XI ARM (DISPOSABLE) ×8
DRAPE DA VINCI XI COLUMN (DISPOSABLE) ×2
DRAPE SURG IRRIG POUCH 19X23 (DRAPES) ×2 IMPLANT
DRSG OPSITE POSTOP 4X10 (GAUZE/BANDAGES/DRESSINGS) IMPLANT
DRSG OPSITE POSTOP 4X6 (GAUZE/BANDAGES/DRESSINGS) IMPLANT
DRSG OPSITE POSTOP 4X8 (GAUZE/BANDAGES/DRESSINGS) IMPLANT
DRSG TEGADERM 2-3/8X2-3/4 SM (GAUZE/BANDAGES/DRESSINGS) ×10 IMPLANT
DRSG TEGADERM 4X4.75 (GAUZE/BANDAGES/DRESSINGS) IMPLANT
ELECT PENCIL ROCKER SW 15FT (MISCELLANEOUS) ×2 IMPLANT
ELECT REM PT RETURN 15FT ADLT (MISCELLANEOUS) ×2 IMPLANT
ENDOLOOP SUT PDS II  0 18 (SUTURE)
ENDOLOOP SUT PDS II 0 18 (SUTURE) IMPLANT
EVACUATOR SILICONE 100CC (DRAIN) IMPLANT
GAUZE SPONGE 2X2 8PLY STRL LF (GAUZE/BANDAGES/DRESSINGS) ×2 IMPLANT
GLOVE ECLIPSE 8.0 STRL XLNG CF (GLOVE) ×6 IMPLANT
GLOVE INDICATOR 8.0 STRL GRN (GLOVE) ×6 IMPLANT
GOWN SRG XL LVL 4 BRTHBL STRL (GOWNS) ×2 IMPLANT
GOWN STRL NON-REIN XL LVL4 (GOWNS) ×2
GOWN STRL REUS W/ TWL XL LVL3 (GOWN DISPOSABLE) ×8 IMPLANT
GOWN STRL REUS W/TWL XL LVL3 (GOWN DISPOSABLE) ×10
GRASPER SUT TROCAR 14GX15 (MISCELLANEOUS) IMPLANT
HOLDER FOLEY CATH W/STRAP (MISCELLANEOUS) ×2 IMPLANT
IRRIG SUCT STRYKERFLOW 2 WTIP (MISCELLANEOUS) ×2
IRRIGATION SUCT STRKRFLW 2 WTP (MISCELLANEOUS) ×2 IMPLANT
KIT PROCEDURE DA VINCI SI (MISCELLANEOUS)
KIT PROCEDURE DVNC SI (MISCELLANEOUS) IMPLANT
KIT SIGMOIDOSCOPE (SET/KITS/TRAYS/PACK) IMPLANT
KIT TURNOVER KIT A (KITS) IMPLANT
NDL INSUFFLATION 14GA 120MM (NEEDLE) ×2 IMPLANT
NEEDLE INSUFFLATION 14GA 120MM (NEEDLE) ×2 IMPLANT
PACK CARDIOVASCULAR III (CUSTOM PROCEDURE TRAY) ×2 IMPLANT
PACK COLON (CUSTOM PROCEDURE TRAY) ×2 IMPLANT
PAD POSITIONING PINK XL (MISCELLANEOUS) ×2 IMPLANT
PROTECTOR NERVE ULNAR (MISCELLANEOUS) ×4 IMPLANT
RELOAD STAPLE 45 3.5 BLU DVNC (STAPLE) IMPLANT
RELOAD STAPLE 45 4.3 GRN DVNC (STAPLE) IMPLANT
RELOAD STAPLE 60 3.5 BLU DVNC (STAPLE) IMPLANT
RELOAD STAPLE 60 4.3 GRN DVNC (STAPLE) IMPLANT
RELOAD STAPLER 3.5X45 BLU DVNC (STAPLE) IMPLANT
RELOAD STAPLER 3.5X60 BLU DVNC (STAPLE) IMPLANT
RELOAD STAPLER 4.3X45 GRN DVNC (STAPLE) IMPLANT
RELOAD STAPLER 4.3X60 GRN DVNC (STAPLE) ×2 IMPLANT
RETRACTOR WND ALEXIS 18 MED (MISCELLANEOUS) IMPLANT
RTRCTR WOUND ALEXIS 18CM MED (MISCELLANEOUS)
SCISSORS LAP 5X35 DISP (ENDOMECHANICALS) ×2 IMPLANT
SEAL CANN UNIV 5-8 DVNC XI (MISCELLANEOUS) ×6 IMPLANT
SEAL XI 5MM-8MM UNIVERSAL (MISCELLANEOUS) ×8
SEALER VESSEL DA VINCI XI (MISCELLANEOUS) ×2
SEALER VESSEL EXT DVNC XI (MISCELLANEOUS) ×2 IMPLANT
SOLUTION ELECTROLUBE (MISCELLANEOUS) ×2 IMPLANT
SPIKE FLUID TRANSFER (MISCELLANEOUS) ×2 IMPLANT
STAPLER 45 DA VINCI SURE FORM (STAPLE)
STAPLER 45 SUREFORM DVNC (STAPLE) IMPLANT
STAPLER 60 DA VINCI SURE FORM (STAPLE) ×2
STAPLER 60 SUREFORM DVNC (STAPLE) IMPLANT
STAPLER CANNULA SEAL DVNC XI (STAPLE) ×2 IMPLANT
STAPLER CANNULA SEAL XI (STAPLE) ×2
STAPLER ECHELON POWER CIR 29 (STAPLE) IMPLANT
STAPLER ECHELON POWER CIR 31 (STAPLE) IMPLANT
STAPLER RELOAD 3.5X45 BLU DVNC (STAPLE)
STAPLER RELOAD 3.5X45 BLUE (STAPLE)
STAPLER RELOAD 3.5X60 BLU DVNC (STAPLE)
STAPLER RELOAD 3.5X60 BLUE (STAPLE)
STAPLER RELOAD 4.3X45 GREEN (STAPLE)
STAPLER RELOAD 4.3X45 GRN DVNC (STAPLE)
STAPLER RELOAD 4.3X60 GREEN (STAPLE) ×2
STAPLER RELOAD 4.3X60 GRN DVNC (STAPLE) ×2
STOPCOCK 4 WAY LG BORE MALE ST (IV SETS) ×4 IMPLANT
SURGILUBE 2OZ TUBE FLIPTOP (MISCELLANEOUS) IMPLANT
SUT MNCRL AB 4-0 PS2 18 (SUTURE) ×2 IMPLANT
SUT PDS AB 1 CT1 27 (SUTURE) ×4 IMPLANT
SUT PROLENE 0 CT 2 (SUTURE) IMPLANT
SUT PROLENE 2 0 KS (SUTURE) IMPLANT
SUT PROLENE 2 0 SH DA (SUTURE) IMPLANT
SUT SILK 2 0 (SUTURE)
SUT SILK 2 0 SH CR/8 (SUTURE) IMPLANT
SUT SILK 2-0 18XBRD TIE 12 (SUTURE) IMPLANT
SUT SILK 3 0 (SUTURE)
SUT SILK 3 0 SH CR/8 (SUTURE) ×2 IMPLANT
SUT SILK 3-0 18XBRD TIE 12 (SUTURE) IMPLANT
SUT V-LOC BARB 180 2/0GR6 GS22 (SUTURE) ×4
SUT VIC AB 3-0 SH 18 (SUTURE) IMPLANT
SUT VIC AB 3-0 SH 27 (SUTURE)
SUT VIC AB 3-0 SH 27XBRD (SUTURE) IMPLANT
SUT VICRYL 0 UR6 27IN ABS (SUTURE) ×2 IMPLANT
SUTURE V-LC BRB 180 2/0GR6GS22 (SUTURE) IMPLANT
SYR 10ML ECCENTRIC (SYRINGE) ×2 IMPLANT
SYS LAPSCP GELPORT 120MM (MISCELLANEOUS)
SYS WOUND ALEXIS 18CM MED (MISCELLANEOUS) ×2
SYSTEM LAPSCP GELPORT 120MM (MISCELLANEOUS) IMPLANT
SYSTEM WOUND ALEXIS 18CM MED (MISCELLANEOUS) ×2 IMPLANT
TOWEL OR NON WOVEN STRL DISP B (DISPOSABLE) ×2 IMPLANT
TRAY FOLEY MTR SLVR 16FR STAT (SET/KITS/TRAYS/PACK) ×2 IMPLANT
TROCAR ADV FIXATION 5X100MM (TROCAR) ×2 IMPLANT
TUBING CONNECTING 10 (TUBING) ×4 IMPLANT
TUBING INSUFFLATION 10FT LAP (TUBING) ×2 IMPLANT

## 2021-11-14 NOTE — Discharge Instructions (Signed)
SURGERY: POST OP INSTRUCTIONS (Surgery for small bowel obstruction, colon resection, etc)   ######################################################################  EAT Gradually transition to a high fiber diet with a fiber supplement over the next few days after discharge  WALK Walk an hour a day.  Control your pain to do that.    CONTROL PAIN Control pain so that you can walk, sleep, tolerate sneezing/coughing, go up/down stairs.  HAVE A BOWEL MOVEMENT DAILY Keep your bowels regular to avoid problems.  OK to try a laxative to override constipation.  OK to use an antidairrheal to slow down diarrhea.  Call if not better after 2 tries  CALL IF YOU HAVE PROBLEMS/CONCERNS Call if you are still struggling despite following these instructions. Call if you have concerns not answered by these instructions  ######################################################################   DIET Follow a light diet the first few days at home.  Start with a bland diet such as soups, liquids, starchy foods, low fat foods, etc.  If you feel full, bloated, or constipated, stay on a ful liquid or pureed/blenderized diet for a few days until you feel better and no longer constipated. Be sure to drink plenty of fluids every day to avoid getting dehydrated (feeling dizzy, not urinating, etc.). Gradually add a fiber supplement to your diet over the next week.  Gradually get back to a regular solid diet.  Avoid fast food or heavy meals the first week as you are more likely to get nauseated. It is expected for your digestive tract to need a few months to get back to normal.  It is common for your bowel movements and stools to be irregular.  You will have occasional bloating and cramping that should eventually fade away.  Until you are eating solid food normally, off all pain medications, and back to regular activities; your bowels will not be normal. Focus on eating a low-fat, high fiber diet the rest of your life  (See Getting to [place], below).  CARE of your INCISION or WOUND  It is good for closed incisions and even open wounds to be washed every day.  Shower every day.  Short baths are fine.  Wash the incisions and wounds clean with soap & water.    You may leave closed incisions open to air if it is dry.   You may cover the incision with clean gauze & replace it after your daily shower for comfort.  TEGADERM:  You have clear gauze band-aid dressings over your closed incision(s).  Remove the dressings 3 days after surgery. = 10/14    If you have an open wound with a wound vac, see wound vac care instructions.    ACTIVITIES as tolerated Start light daily activities --- self-care, walking, climbing stairs-- beginning the day after surgery.  Gradually increase activities as tolerated.  Control your pain to be active.  Stop when you are tired.  Ideally, walk several times a day, eventually an hour a day.   Most people are back to most day-to-day activities in a few weeks.  It takes 4-8 weeks to get back to unrestricted, intense activity. If you can walk 30 minutes without difficulty, it is safe to try more intense activity such as jogging, treadmill, bicycling, low-impact aerobics, swimming, etc. Save the most intensive and strenuous activity for last (Usually 4-8 weeks after surgery) such as sit-ups, heavy lifting, contact sports, etc.  Refrain from any intense heavy lifting or straining until you are off narcotics for pain control.  You will have off  days, but things should improve week-by-week. DO NOT PUSH THROUGH PAIN.  Let pain be your guide: If it hurts to do something, don't do it.  Pain is your body warning you to avoid that activity for another week until the pain goes down. You may drive when you are no longer taking narcotic prescription pain medication, you can comfortably wear a seatbelt, and you can safely make sudden turns/stops to protect yourself without hesitating due to  pain. You may have sexual intercourse when it is comfortable. If it hurts to do something, stop.  MEDICATIONS Take your usually prescribed home medications unless otherwise directed.   Blood thinners:  Usually you can restart any strong blood thinners after the second postoperative day.  It is OK to take aspirin right away.     If you are on strong blood thinners (warfarin/Coumadin, Plavix, Xerelto, Eliquis, Pradaxa, etc), discuss with your surgeon, medicine PCP, and/or cardiologist for instructions on when to restart the blood thinner & if blood monitoring is needed (PT/INR blood check, etc).     PAIN CONTROL Pain after surgery or related to activity is often due to strain/injury to muscle, tendon, nerves and/or incisions.  This pain is usually short-term and will improve in a few months.  To help speed the process of healing and to get back to regular activity more quickly, DO THE FOLLOWING THINGS TOGETHER: Increase activity gradually.  DO NOT PUSH THROUGH PAIN Use Ice and/or Heat Try Gentle Massage and/or Stretching Take over the counter pain medication Take Narcotic prescription pain medication for more severe pain  Good pain control = faster recovery.  It is better to take more medicine to be more active than to stay in bed all day to avoid medications.  Increase activity gradually Avoid heavy lifting at first, then increase to lifting as tolerated over the next 6 weeks. Do not "push through" the pain.  Listen to your body and avoid positions and maneuvers than reproduce the pain.  Wait a few days before trying something more intense Walking an hour a day is encouraged to help your body recover faster and more safely.  Start slowly and stop when getting sore.  If you can walk 30 minutes without stopping or pain, you can try more intense activity (running, jogging, aerobics, cycling, swimming, treadmill, sex, sports, weightlifting, etc.) Remember: If it hurts to do it, then don't do  it! Use Ice and/or Heat You will have swelling and bruising around the incisions.  This will take several weeks to resolve. Ice packs or heating pads (6-8 times a day, 30-60 minutes at a time) will help sooth soreness & bruising. Some people prefer to use ice alone, heat alone, or alternate between ice & heat.  Experiment and see what works best for you.  Consider trying ice for the first few days to help decrease swelling and bruising; then, switch to heat to help relax sore spots and speed recovery. Shower every day.  Short baths are fine.  It feels good!  Keep the incisions and wounds clean with soap & water.   Try Gentle Massage and/or Stretching Massage at the area of pain many times a day Stop if you feel pain - do not overdo it Take over the counter pain medication This helps the muscle and nerve tissues become less irritable and calm down faster Choose ONE of the following over-the-counter anti-inflammatory medications: Acetaminophen 585m tabs (Tylenol) 1-2 pills with every meal and just before bedtime (avoid if you have liver  problems or if you have acetaminophen in you narcotic prescription) Naproxen 217m tabs (ex. Aleve, Naprosyn) 1-2 pills twice a day (avoid if you have kidney, stomach, IBD, or bleeding problems) Ibuprofen 2093mtabs (ex. Advil, Motrin) 3-4 pills with every meal and just before bedtime (avoid if you have kidney, stomach, IBD, or bleeding problems) Take with food/snack several times a day as directed for at least 2 weeks to help keep pain / soreness down & more manageable. Take Narcotic prescription pain medication for more severe pain A prescription for strong pain control is often given to you upon discharge (for example: oxycodone/Percocet, hydrocodone/Norco/Vicodin, or tramadol/Ultram) Take your pain medication as prescribed. Be mindful that most narcotic prescriptions contain Tylenol (acetaminophen) as well - avoid taking too much Tylenol. If you are having  problems/concerns with the prescription medicine (does not control pain, nausea, vomiting, rash, itching, etc.), please call [phone] see if we need to switch you to a different pain medicine that will work better for you and/or control your side effects better. If you need a refill on your pain medication, you must call the office before 4 pm and on weekdays only.  By federal law, prescriptions for narcotics cannot be called into a pharmacy.  They must be filled out on paper & picked up from our office by the patient or authorized caretaker.  Prescriptions cannot be filled after 4 pm nor on weekends.    WHEN TO CALL [phone] uncontrolled or worsening pain  Fever over 101 F (38.5 C) Concerns with the incision: Worsening pain, redness, rash/hives, swelling, bleeding, or drainage Reactions / problems with new medications (itching, rash, hives, nausea, etc.) Nausea and/or vomiting Difficulty urinating Difficulty breathing Worsening fatigue, dizziness, lightheadedness, blurred vision Other concerns If you are not getting better after two weeks or are noticing you are getting worse, contact our office [phone] for further advice.  We may need to adjust your medications, re-evaluate you in the office, send you to the emergency room, or see what other things we can do to help. The clinic staff is available to answer your questions during regular business hours (8:30am-5pm).  Please don't hesitate to call and ask to speak to one of our nurses for clinical concerns.    A [facility] is always on call at the hospitals 24 hours/day If you have a medical emergency, go to the nearest emergency room or call 911.  FOLLOW UP in our office One the day of your discharge from the hospital (or the next business weekday), please call [facility] to set up or confirm an appointment to see your surgeon in the office for a follow-up appointment.   Usually it is 2-3 weeks after your surgery.   If you have skin staples at your incision(s), let the office know so we can set up a time in the office for the nurse to remove them (usually around 10 days after surgery). Make sure that you call for appointments the day of discharge (or the next business weekday) from the hospital to ensure a convenient appointment time. IF YOU HAVE DISABILITY OR FAMILY LEAVE FORMS, BRING THEM TO THE OFFICE FOR PROCESSING.  DO NOT GIVE THEM TO YOUR DOCTOR.  [facility] [address] [address]  [id number] ([phone] Main [phone] [place] ([phone] Fax [email or web address]    GETTING TO GOOD BOWEL HEALTH. It is expected  for your digestive tract to need a few months to get back to normal.  It is common for your bowel movements and stools to be irregular.  You will have occasional bloating and cramping that should eventually fade away.  Until you are eating solid food normally, off all pain medications, and back to regular activities; your bowels will not be normal.   Avoiding constipation The goal: ONE SOFT BOWEL MOVEMENT A DAY!    Drink plenty of fluids.  Choose water first. TAKE A FIBER SUPPLEMENT EVERY DAY THE REST OF YOUR LIFE During your first week back home, gradually add back a fiber supplement every day Experiment which form you can tolerate.   There are many forms such as powders, tablets, wafers, gummies, etc Psyllium bran (Metamucil), methylcellulose (Citrucel), Miralax or Glycolax, Benefiber, Flax Seed.  Adjust the dose week-by-week (1/2 dose/day to 6 doses a day) until you are moving your bowels 1-2 times a day.  Cut back the dose or try a different fiber product if it is giving you problems such as diarrhea or bloating. Sometimes a laxative is needed to help jump-start bowels if constipated until the fiber supplement can help regulate your bowels.  If you are tolerating eating  & you are farting, it is okay to try a gentle laxative such as double dose MiraLax, prune juice, or Milk of Magnesia.  Avoid using laxatives too often. Stool softeners can sometimes help counteract the constipating effects of narcotic pain medicines.  It can also cause diarrhea, so avoid using for too long. If you are still constipated despite taking fiber daily, eating solids, and a few doses of laxatives, call our office. Controlling diarrhea Try drinking liquids and eating bland foods for a few days to avoid stressing your intestines further. Avoid dairy products (especially milk & ice cream) for a short time.  The intestines often can lose the ability to digest lactose when stressed. Avoid foods that cause gassiness or bloating.  Typical foods include beans and other legumes, cabbage, broccoli, and dairy foods.  Avoid greasy, spicy, fast foods.  Every person has some sensitivity to other foods, so listen to your body and avoid those foods that trigger problems for you. Probiotics (such as active yogurt, Align, etc) may help repopulate the intestines and colon with normal bacteria and calm down a sensitive digestive tract Adding a fiber supplement gradually can help thicken stools by absorbing excess fluid and retrain the intestines to act more normally.  Slowly increase the dose over a few weeks.  Too much fiber too soon can backfire and cause cramping & bloating. It is okay to try and slow down diarrhea with a few doses of antidiarrheal medicines.   Bismuth subsalicylate (ex. Kayopectate, Pepto Bismol) for a few doses can help control diarrhea.  Avoid if pregnant.   Loperamide (Imodium) can slow down diarrhea.  Start with one tablet (6m) first.  Avoid if you are having fevers or severe pain.  ILEOSTOMY PATIENTS WILL HAVE CHRONIC DIARRHEA since their colon is not in use.    Drink plenty of liquids.  You will need to drink even more glasses of water/liquid a day to avoid getting dehydrated. Record  output from your ileostomy.  Expect to empty the bag every 3-4 hours at first.  Most people with a permanent ileostomy empty their bag 4-6 times at the least.   Use antidiarrheal medicine (especially Imodium) several times a day to avoid getting dehydrated.  Start with a dose at bedtime &  breakfast.  Adjust up or down as needed.  Increase antidiarrheal medications as directed to avoid emptying the bag more than 8 times a day (every 3 hours). Work with your wound ostomy nurse to learn care for your ostomy.  See ostomy care instructions. TROUBLESHOOTING IRREGULAR BOWELS 1) Start with a soft & bland diet. No spicy, greasy, or fried foods.  2) Avoid gluten/wheat or dairy products from diet to see if symptoms improve. 3) Miralax 17gm or flax seed mixed in [place]. water or juice-daily. May use 2-4 times a day as needed. 4) Gas-X, Phazyme, etc. as needed for gas & bloating.  5) Prilosec (omeprazole) over-the-counter as needed 6)  Consider probiotics (Align, Activa, etc) to help calm the bowels down  Call your doctor if you are getting worse or not getting better.  Sometimes further testing (cultures, endoscopy, X-ray studies, CT scans, bloodwork, etc.) may be needed to help diagnose and treat the cause of the diarrhea. [facility], [place], [place], [place], [place]  [id number] [phone] - Main.    [phone]  - Toll Free.   [phone] - Fax [email or web address]   #################################  Pelvic floor muscle training exercises ("Kegels") can help strengthen the muscles under the uterus, bladder, and bowel (large intestine). They can help both men and women who have problems with urine leakage or bowel control.  A pelvic floor muscle training exercise is like pretending that you have to urinate, and then holding it. You relax and tighten the muscles that control urine flow. It's important to find the right muscles to tighten.  The next time  you have to urinate, start to go and then stop. Feel the muscles in your vagina, bladder, or anus get tight and move up. These are the pelvic floor muscles. If you feel them tighten, you've done the exercise right. If you are still not sure whether you are tightening the right muscles, keep in mind that all of the muscles of the pelvic floor relax and contract at the same time. Because these muscles control the bladder, rectum, and vagina, the following tips may help: Women: Insert a finger into your vagina. Tighten the muscles as if you are holding in your urine, then let go. You should feel the muscles tighten and move up and down.  Men: Insert a finger into your rectum. Tighten the muscles as if you are holding in your urine, then let go. You should feel the muscles tighten and move up and down. These are the same muscles you would tighten if you were trying to prevent yourself from passing gas.  It is very important that you keep the following muscles relaxed while doing pelvic floor muscle training exercises: Abdominal  Buttocks (the deeper, anal sphincter muscle should contract)  Thigh   A woman can also strengthen these muscles by using a vaginal cone, which is a weighted device that is inserted into the vagina. Then you try to tighten the pelvic floor muscles to hold the device in place. If you are unsure whether you are doing the pelvic floor muscle training correctly, you can use biofeedback and electrical stimulation to help find the correct muscle group to work. Biofeedback is a method of positive reinforcement. Electrodes are placed on the abdomen and along the anal area. Some therapists place a sensor in the vagina in women or anus in men to monitor the contraction of pelvic floor muscles.  A monitor will display a graph showing which muscles are  contracting and which are at rest. The therapist can help find the right muscles for performing pelvic floor muscle training exercises.    PERFORMING PELVIC FLOOR EXERCISES: 1. Begin by emptying your bladder. 2. Tighten the pelvic floor muscles and hold for a count of 10. 3. Relax the muscles completely for a count of 10. 4. Do 10 repititions, 3 to 5 times a day (morning, afternoon, and night). You can do these exercises at any time and any place. Most people prefer to do the exercises while lying down or sitting in a chair. After 4 - 6 weeks, most people notice some improvement. It may take as long as 3 months to see a major change. After a couple of weeks, you can also try doing a single pelvic floor contraction at times when you are likely to leak (for example, while getting out of a chair). A word of caution: Some people feel that they can speed up the progress by increasing the number of repetitions and the frequency of exercises. However, over-exercising can instead cause muscle fatigue and increase urine leakage. If you feel any discomfort in your abdomen or back while doing these exercises, you are probably doing them wrong. Breathe deeply and relax your body when you are doing these exercises. Make sure you are not tightening your stomach, thigh, buttock, or chest muscles. When done the right way, pelvic floor muscle exercises have been shown to be very effective at improving urinary continence.  Pelvic Floor Pain / Incontinence  Do you suffer from pelvic pain or incontinence? Do you have pain in the pelvis, low back or hips that is associated with sitting, walking, urination or intercourse? Have you experienced leaking of urine or feces when coughing, sneezing or laughing? Do you have pain in the pelvic area associated with cancer?  These are conditions that are common with pelvic floor muscle dysfunction. Over time, due to stress, scar tissue, surgeries and the natural course of aging, our muscles may become weak or overstressed and can spasm. This can lead to pain, weakness, incontinence or decreased quality of  life.  Men and women with pelvic floor dysfunction frequently describe:  A "falling out" feeling. Pain or burning in the abdomen, tailbone or perineal area. Constipation or bowel elimination problems or difficulty initiating urination. Unresolved low back or hip pain. Frequency and urgency when going to the bathroom. Leaking of urine or feces. Pain with intercourse.  [email or web address]  To make a referral or for more information about [facility], call  [employer]) - [place] [phone] [place]) - [facility]) - [phone]

## 2021-11-14 NOTE — Op Note (Signed)
11/14/2021  12:17 PM  PATIENT:  Connie Huang  71 y.o. female  Patient Care Team: Elby Beck, DO as PCP - General (Family Medicine) Michael [place], MD as Consulting Physician (General Surgery) Mauri Pole, MD as Consulting Physician (Gastroenterology) Thornton Park, MD as Consulting Physician (Gastroenterology)  PRE-OPERATIVE DIAGNOSIS:  RECTAL PROLAPSE  POST-OPERATIVE DIAGNOSIS:  RECTAL PROLAPSE  PROCEDURE:   ROBOTIC LOW ANTERIOR RESECTION OF RECTOSIGMOID RECTOPEXY TRANSVERSUS ABDOMINIS PLANE (TAP) BLOCK - BILATERAL RIGID PROCTOSCOPY  SURGEON:  Adin Hector, MD  ASSISTANT:  Carlena Hurl Maczis, PA-C Fontaine No, PA-S, [employer]    ANESTHESIA:   local and general  TRANSVERSUS ABDOMINIS PLANE (TAP) BLOCK - BILATERAL Nerve block provided with liposomal bupivacaine (Experel) mixed with 0.25% bupivacaine as a Bilateral TAP block x 3m each side at the level of the transverse abdominis & preperitoneal spaces along the flank at the anterior axillary line, from subcostal ridge to iliac crest under laparoscopic guidance    EBL:  Total I/O In: 2000 [I.V.:1900; IV Piggyback:100] Out: 230 [Urine:200; Blood:30]  Delay start of Pharmacological VTE agent (>24hrs) due to surgical blood loss or risk of bleeding:  no  DRAINS: 19 Fr Blake drain goes down to posterior & anterior pelvis  SPECIMEN:  RECTOSIGMOID COLON WITH DISTAL ANASTOMOTIC RING  DISPOSITION OF SPECIMEN:  PATHOLOGY  COUNTS:  YES  PLAN OF CARE: Admit to inpatient   PATIENT DISPOSITION:  PACU - hemodynamically stable.  INDICATION:    Elderly woman with increasing episodes of rectal prolapse with incontinence to flatus and now complete fecal incontinence.  Colonoscopy rule out any malignancy or other lead point.  Decree sphincter tone due to chronic prolapse.  Recommendation made for resection rectopexy  The anatomy & physiology of the digestive tract was discussed.  The pathophysiology  of rectal prolapse was discussed.  Natural history risks without surgery was discussed.   I feel the risks of no intervention will lead to serious problems that outweigh the operative risks; therefore, I recommended surgery to treat the pathology.  Possible need for sigmoid colectomy to remove redundant colon was discussed.  Pexy by suture and probable mesh reinforcement was discussed as well.  Laparoscopic & open techniques were discussed.   Risks such as bleeding, infection, abscess, leak, reoperation, possible ostomy, hernia, stroke, heart attack, death, and other risks were discussed.   I noted a good likelihood this will help address the problem.  Goals of post-operative recovery were discussed as well.  We will work to minimize complications.  An educational handout on the technique was given as well.  Questions were answered.  The patient expresses understanding & wishes to proceed with surgery.   OR FINDINGS:   CASE DATA:  Type of patient?: Elective [facility] Private Case  Status of Case? Elective Scheduled  Infection Present At Time Of Surgery (PATOS)?  NO  Patient had very redundant rectosigmoid colon.  Chronic prolapse.  Ended up resecting about 60 cm of redundancy. No obvious metastatic disease on visceral parietal peritoneum or liver.  The anastomosis rests 10 cm from the anal verge by rigid proctoscopy.  It is a distal-descending-colon to mid-rectal 29 EEA stapled anastomosis   PROCEDURE:  Informed consent was confirmed.  Patient underwent general anesthesia without any difficulty & was positioned in low lithotomy with arms tucked.  The patient had a Foley catheter sterilely placed.  The abdomen and perineum were prepped and draped in sterile fashion.  Surgical time-out confirmed our plan.   I placed a  8-mm robotic port in the left upper quadrant using Varess technique with the patient in steep reverse Trendelenburg and left sideup.  The patient had orogastric tube for decompression.   Entry was clean.  We induced carbon dioxide insufflation.  Port placed.  Extra ports placed carefully under direct visualization. A few omental adhesions were noted in the right lower quadrant.  These were sharply freed off.  We positioned the patient head down and evacuated the greater omentum and the upper abdomen.  Went ahead and docked the [employer] carefully.  I turned attention to the pelvis.  Freddrick March off some omental adhesions to the left lower quadrant and got the omentum and small bowel out of the pelvis.  There was a lot of redundant sigmoid colon.  Adhesions to the left lateral pelvis allowing to twist making probable volvulus Hayden Pedro very easy.  I mobilized these adhesions off the left pelvis taking care to avoid involving the adnexa and ureters.  That help straighten the region out.  With lateral adhesions freed off,  I was able to straighten & reduce out the redundant rectosigmoid colon.  I scored the peritoneum at the rectosigmoid region at the level of the sacral promontory and continued scoring of peritoneum down to the right peritoneal reflection, preserving the lateral radical pedicle supply.  I left the rectosigmoid mesentery anteriorly and got into the presacral plane.  I identified the left ureter, kept that posteriorly in the retroperitioneum.  I identified the left gonadals and kept those posterior as well.  I tried to avoid any more dissection more proximally to avoid disturbing any further attachements of the left colon to the left pericolic gutter.     Focus on pelvic dissection.  Freed the mesorectum off the presacral plane and follow that down to the pelvic floor.  Freed off the visceral peritoneum off the rectal pedicles and came down to the peritoneal reflection anteriorly.  I then came around anteriorly to help mobilize the anterior rectum off its anterior attachments to the rectovaginal septum all the way down to the pelvic floor for good anterior rectal mobilization.  I went back to  mobilizing the posterior mesorectum off the presacral space. Freed off the posterior midline anococcygeal ligament gradually elevated and was able to reduce the chronic rectal prolapse back in to the pelvis.  With that, I could get the mid rectum to stretch up to the sacral promontory to help straighten out the stretched out rectum. I did digital rectal exam to confirm that I had good dissection anteriorly and posteriorly down to the sphincter complex with good mobilization. We had worked to spare lateral blood supply.  I made an effort to keep the lateral pedicles intact on both sides at mid and distal rectum.  I created a window between the proximal and mid rectal regions where it reached to the sacral promontory under some mild tension.  I transected the mesorectum at the rectosigmoid junction and peeled the mesorectum down to the mid rectum to ensure good blood supply. Then brought down to the left colon and chose a region in the distal descending colon that could reach down to the sacral promontory with mild tension.  Transected the mesentery of the left colon, sparing the IMA pedicle and much of the rectosigmoid mesentery until I came to that area in the distal descending colon.  Preserved good arterial blood supply.  I transected at the mid rectum with a 74m green load firing robotic stapler. 100% first stapling. I then  used 0 Prolene suture through the left lateral sacral promontory and the left posterior rectal stump. Left untied & clipped that. Did a similar mirror-image stitch on the right side of the sacral promontory to the right posterior rectal stump. Clipped that to the side as well.   I created an extraction incision through a small Pfannenstiel incision in the suprapubic region where the stapler port had been placed.  Placed a wound protector.  I was able to eviscerate the rectosigmoid colon out the wound.   I clamped the colon proximal to this area using a reusable pursestringer device.   Passed a 2-0 Keith needle. I transected at the descending/sigmoid junction with a scalpel. I got healthy bleeding mucosa.  We sent the rectosigmoid colon specimen off to go to pathology.  We sized the colon orifice.  I chose a 29 EEA anvil stapler system.  Reinforced the prolene pursestring it with interrupted silk suture like a belt loops around the pursestring.  Hemostasis was good. We returned to the colon with the anvil back into the abdomen and placed a wound protector. The robot was redocked. Hemostasis was good in the abdomen and pelvis. The distal end of the remaining colon reached down to the rectal stump.       Ms Lemar Lofty scrubbed down and did gentle anal dilation and advanced the EEA stapler up the rectal stump. The spike was brought out at the provimal end of the rectal stump under direct visualization.  I  attached the anvil of the proximal colon the spike of the stapler. Anvil was tightened down and held clamped for 60 seconds. The EEA stapler was fired and held clamped for 30 seconds. The stapler was released & removed. We noted 2 excellent anastomotic rings. Blue stitch is in the proximal ring.  We did rigid proctoscopy noted the anastomosis was at 10 cm from the anal verge consistent with the mid rectum.  We irrigation of isotonic solution & held that for the pelvic air leak test .  The rectum was insufflated the rectum while clamping the colon proximal to that anastomosis.  There was a negative air leak test. There was no tension of mesentery or bowel at the anastomosis.   Tissues looked viable.  Ureters & bowel uninjured.  The anastomosis looked healthy. Greater omentum positioned down into the pelvis to help protect the anastomosis.  I tied down the pair of 0 Prolene stitches to help do the posterolateral rectopexy in the mid rectum to the sacral promontory. I then used 2-0 V lock and ran on the distal lateral pelvis peritoneal reflection and ran proximally up the left paracolic gutter. Did  another stitch on the right side as well.  Thus anterolateral colorectopexy right and left.  Placed a drain through a side-port into the presacral space down to the pelvis and wrapped around the anterior pelvis. Secured with 2-0 Prolene suture.  We had removed all needles and excess suture from the peritoneal cavity.  We inspected the abdomen and pelvis cavity..  Hemostasis was good.  Needle and instrument counts good  Endoluminal gas was evacuated.  Ports & wound protector removed.  We changed gloves & redraped the patient per colon SSI prevention protocol.  We aspirated the antibiotic irrigation.  Hemostasis was good.  Sterile unused instruments were used from this point.  I closed the skin at the port sites using Monocryl stitch and sterile dressing.  I closed the extraction wound using a 0 Vicryl vertical peritoneal closure and a #  1 PDS transverse anterior rectal fascial closure like a small Pfannenstiel closure. I closed the skin with some interrupted Monocryl stitches.  I placed sterile dressings.     Patient is being extubated go to recovery room. I had discussed postop care with the patient in detail the office & in the holding area. Instructions are written. I discussed operative findings, updated the patient's status, discussed probable steps to recovery, and gave postoperative recommendations to the patient's son, Christene Slates .  Recommendations were made.  Questions were answered.  He expressed understanding & appreciation.   Adin Hector, M.D., F.A.C.S. Gastrointestinal and Minimally Invasive Surgery [facility] [address], [place], [place] [address] [phone] Main / Paging

## 2021-11-14 NOTE — Progress Notes (Signed)
   11/14/21 1350  What Happened  Was fall witnessed? Yes  Who witnessed fall? Neta Ehlers, Evalee Jefferson RN  Patients activity before fall ambulating-assisted  Point of contact buttocks;other (comment) (assisted fall)  Was patient injured? No  Follow Up  MD notified Dr. Johney Maine  Time MD notified [phone]  Family notified Yes - comment  Time family notified 1420  Additional tests No  Simple treatment Other (comment) (assessed)  Progress note created (see row info) Yes   Pt came up from PACU and Loma Sousa was trying to assist the pt to the bed by ambulating from the stretcher.  Pt went unresponsive and began to fall.  Nurse came and helped lower pt to the floor.  Pt was able to open her eyes and speak with staff. This nurse and Loma Sousa helped get pt in bed.  No injuries noted.  Pt's son notified at bedside about assisted fall.

## 2021-11-14 NOTE — Transfer of Care (Signed)
Immediate Anesthesia Transfer of Care Note  Patient: Connie Huang  Procedure(s) Performed: ROBOTIC LOW ANTERIOR RESECTION OF RECTOSIGMOID WITH BILATERAL TAP BLOCK (Abdomen) RECTOPEXY (Abdomen) RIGID PROCTOSCOPY  Patient Location: PACU  Anesthesia Type:General  Level of Consciousness: drowsy and patient cooperative  Airway & Oxygen Therapy: Patient Spontanous Breathing and Patient connected to face mask oxygen  Post-op Assessment: Report given to RN and Post -op Vital signs reviewed and stable  Post vital signs: Reviewed and stable  Last Vitals:  Vitals Value Taken Time  BP 140/62 11/14/21 1225  Temp    Pulse 87 11/14/21 1227  Resp 28 11/14/21 1227  SpO2 99 % 11/14/21 1227  Vitals shown include unvalidated device data.  Last Pain:  Vitals:   11/14/21 0715  TempSrc:   PainSc: 0-No pain         Complications: No notable events documented.

## 2021-11-14 NOTE — Interval H&P Note (Signed)
History and Physical Interval Note:  11/14/2021 8:04 AM  Connie Huang  has presented today for surgery, with the diagnosis of RECTAL PROLAPSE.  The various methods of treatment have been discussed with the patient and family. After consideration of risks, benefits and other options for treatment, the patient has consented to  Procedure(s): ROBOTIC RESECTION OF COLON, RECTOSIGMOID (N/A) RECTOPEXY (N/A) RIGID PROCTOSCOPY (N/A) as a surgical intervention.  The patient's history has been reviewed, patient examined, no change in status, stable for surgery.  I have reviewed the patient's chart and labs.  Questions were answered to the patient's satisfaction.    I have re-reviewed the the patient's records, history, medications, and allergies.  I have re-examined the patient.  I again discussed intraoperative plans and goals of post-operative recovery.  The patient agrees to proceed.  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   Complication of anesthesia 1973   woke up during Tonsillectomy   Depression    Diabetes mellitus without complication ([place])    Type I   DKA (diabetic ketoacidosis) ([place]) 02/24/2021   GERD (gastroesophageal reflux disease)    Glaucoma    Hyperlipidemia    Hypertension    Hyponatremia 05/2015   Lumbar stenosis    Neuromuscular disorder ([place])    Pneumonia 05/2015   PONV (postoperative nausea and vomiting)     Past Surgical History:  Procedure Laterality Date   ANTERIOR LAT LUMBAR FUSION Left 02/15/2016   Procedure: LEFT LUMBAR THREE-FOUR ANTEROLATERAL LUMBAR  INTERBODY FUSION WITH LATERAL PLATE;  Surgeon: Erline Levine, MD;  Location: [facility];  Service: Neurosurgery;  Laterality: Left;   BACK SURGERY     Lumbar 4 and 5 Fusion   CATARACT EXTRACTION Bilateral 2023   [place], [place]   COLONOSCOPY  2015   last colonoscopy was done in [place]   EYE SURGERY     TONSILLECTOMY      Social History   Socioeconomic History   Marital status: Widowed    Spouse name: Not on file   Number of children: Not on file   Years of education: Not on file   Highest education level: Not on file  Occupational History   Occupation: retired  Tobacco Use   Smoking status: Former    Years: 20.00    Types: Cigarettes    Quit date: 10/15/1997    Years since quitting: 24.0   Smokeless tobacco: Never  Vaping Use   Vaping Use: Never used  Substance and Sexual Activity   Alcohol use: No   Drug use: No   Sexual activity: Not Currently  Other Topics Concern   Not on file  Social History Narrative   Not on file   Social Determinants of Health   Financial Resource Strain: Not on file  Food Insecurity: Not on file  Transportation Needs: Not on file  Physical Activity: Not on file  Stress: Not on file  Social Connections: Not on file  Intimate Partner Violence: Not on file    Family  History  Problem Relation Age of Onset   Hypertension Mother    Hyperlipidemia Mother    Dementia Mother    Heart disease Father    Hyperlipidemia Father    Hypertension Father    Stroke Father    Heart attack Father    Colon cancer Neg Hx    Esophageal cancer Neg Hx    Rectal cancer Neg Hx    Stomach cancer Neg Hx     Medications Prior to Admission  Medication Sig Dispense Refill Last Dose   Ascorbic Acid (VITAMIN C) 1000 MG tablet Take 1,000 mg by mouth daily.   11/12/2021   aspirin 81 MG tablet Take 81 mg by mouth daily.   11/12/2021   atorvastatin (LIPITOR) 10 MG tablet Take 10 mg by mouth daily.   11/13/2021   B Complex-C (SUPER B COMPLEX PO) Take 1 capsule by mouth  daily.   Past Week   bisacodyl 5 MG EC tablet Take 20 mg by mouth daily as needed for moderate constipation.   11/13/2021   Calcium Carb-Cholecalciferol 600-800 MG-UNIT TABS Take 1 tablet by mouth 2 (two) times daily.   Past Week   cetirizine (ZYRTEC) 10 MG tablet Take 10 mg by mouth daily.   11/13/2021   Cholecalciferol (VITAMIN D3) 50 MCG (2000 UT) TABS Take 2,000 Units by mouth daily.   Past Week   insulin aspart (NOVOLOG) 100 UNIT/ML injection Use with pump for TDD around 60 units daily. Icd10 code e10.9 70 mL 3 in place now   losartan (COZAAR) 100 MG tablet Take 100 mg by mouth daily.   11/13/2021   magnesium (MAGTAB) 84 MG (7MEQ) TBCR SR tablet Take 84 mg by mouth daily.   11/12/2021   Multiple Vitamin (MULTIVITAMIN WITH MINERALS) TABS tablet Take 1 tablet by mouth daily.   Past Week   SM OMEGA-3-6-9 FATTY ACIDS PO Take 1,600 mg by mouth 2 (two) times daily.   Past Week   triamcinolone cream (KENALOG) 0.1 % Apply 1 Application topically daily as needed (dermatitis).   Past Month   venlafaxine (EFFEXOR) 75 MG tablet Take 75 mg by mouth daily.   11/14/2021 at 0525   Wheat Dextrin (BENEFIBER DRINK MIX PO) Take 1 packet by mouth daily.   11/07/2021   zinc sulfate 50 MG CAPS capsule Take 50 mg by mouth daily.   Past Week   Accu-Chek FastClix Lancets MISC Accu-Chek Fastclix Lancet Drum  USE AS DIRECTED   supply   Continuous Blood Gluc Receiver (DEXCOM G6 RECEIVER) DEVI Dexcom G6 Receiver misc  USE AS DIRECTED   RLQ-abdomen   Continuous Blood Gluc Sensor (DEXCOM G6 SENSOR) MISC USE 1 SENSOR EVERY 10 DAYS TO MONITOR BLOOD GLUCOSE FOUR TIMES DAILY 9 each 0 RLQ-abdomen   Continuous Blood Gluc Sensor (DEXCOM G6 SENSOR) MISC USE 1 SENSOR EVERY 10 DAYS TO MONITOR BLOOD GLUCOSE FOUR TIMES DAILY 9 each 0 duplicate   Continuous Blood Gluc Transmit (DEXCOM G6 TRANSMITTER) MISC Use 1 transmitter every 90 days. 1 each 1 duplicate    Current Facility-Administered Medications  Medication Dose Route Frequency  Provider Last Rate Last Admin   bisacodyl (DULCOLAX) EC tablet 20 mg  20 mg Oral Once Michael Boston, MD       bupivacaine liposome (EXPAREL) 1.3 % injection 266 mg  20 mL Infiltration Once Michael Boston, MD       cefoTEtan (CEFOTAN) 2 g in sodium chloride 0.9 % 100 mL IVPB  2 g Intravenous On Call to  OR Michael Boston, MD       Chlorhexidine Gluconate Cloth 2 % PADS 6 each  6 each Topical Once Abigaile Rossie, Remo Lipps, MD       And   Chlorhexidine Gluconate Cloth 2 % PADS 6 each  6 each Topical Once Michael Boston, MD       Derrill Memo ON 11/15/2021] feeding supplement (ENSURE PRE-SURGERY) liquid 296 mL  296 mL Oral Once Michael Boston, MD       feeding supplement (ENSURE PRE-SURGERY) liquid 592 mL  592 mL Oral Once Michael Boston, MD       lactated ringers infusion   Intravenous Continuous Albertha Ghee, MD 10 mL/hr at 11/14/21 0730 New Bag at 11/14/21 0730   neomycin (MYCIFRADIN) tablet 1,000 mg  1,000 mg Oral 3 times per day Michael Boston, MD       And   metroNIDAZOLE (FLAGYL) tablet 1,000 mg  1,000 mg Oral 3 times per day Michael Boston, MD       phenylephrine (NEOSYNEPHRINE) 20-0.9 MG/250ML-% infusion            polyethylene glycol powder (GLYCOLAX/MIRALAX) container 255 g  1 Container Oral Once Michael Boston, MD         Allergies  Allergen Reactions   Zonalon [Doxepin Hcl] Rash   Erythromycin Diarrhea, Nausea And Vomiting and Other (See Comments)    Other reaction(s): Stomach pain    BP (!) 150/68 (BP Location: Right Arm)   Pulse 83   Temp 97.8 F (36.6 C) (Oral)   Resp 15   Ht '5\' 5"'$  (1.651 m)   Wt 69.4 kg   SpO2 98%   BMI 25.46 kg/m   Labs: Results for orders placed or performed during the hospital encounter of 11/14/21 (from the past 48 hour(s))  Glucose, capillary     Status: Abnormal   Collection Time: 11/14/21  7:01 AM  Result Value Ref Range   Glucose-Capillary 129 (H) 70 - 99 mg/dL    Comment: Glucose reference range applies only to samples taken after fasting for at least 8 hours.     Imaging / Studies: No results found.   Adin Hector, M.D., F.A.C.S. Gastrointestinal and Minimally Invasive Surgery [facility] [address], [place], [place] [address] [phone] Main / Paging  11/14/2021 8:04 AM    Adin Hector

## 2021-11-14 NOTE — Anesthesia Postprocedure Evaluation (Signed)
Anesthesia Post Note  Patient: Connie Huang  Procedure(s) Performed: ROBOTIC LOW ANTERIOR RESECTION OF RECTOSIGMOID WITH BILATERAL TAP BLOCK (Abdomen) RECTOPEXY (Abdomen) RIGID PROCTOSCOPY     Patient location during evaluation: PACU Anesthesia Type: General Level of consciousness: awake and alert Pain management: pain level controlled Vital Signs Assessment: post-procedure vital signs reviewed and stable Respiratory status: spontaneous breathing, nonlabored ventilation, respiratory function stable and patient connected to nasal cannula oxygen Cardiovascular status: blood pressure returned to baseline and stable Postop Assessment: no apparent nausea or vomiting Anesthetic complications: no   No notable events documented.  Last Vitals:  Vitals:   11/14/21 1606 11/14/21 1703  BP: (!) 104/51 (!) 97/51  Pulse: 73 68  Resp: 17 17  Temp: (!) 36.4 C (!) 36.4 C  SpO2: 100% 91%    Last Pain:  Vitals:   11/14/21 1703  TempSrc: Oral  PainSc:                  Connie Huang

## 2021-11-14 NOTE — H&P (Signed)
11/14/2021   REFERRING PHYSICIAN: Shelva Majestic*  Patient Care Team: Armstead Peaks as PCP - General Beavers, Delight Ovens, MD (Gastroenterology) Johney Maine, Adrian Saran, MD as Consulting Provider (General Surgery)  PROVIDER: Hollace Kinnier, MD  [facility] MRN: [id number] DOB: 1950-10-14  SUBJECTIVE   Chief Complaint: New Consultation (Rectal Prolapse )   History of Present Illness: Leonore Frankson is a 71 y.o. female who is seen today  as an office consultation at the request of Dr. Tarri Glenn  for evaluation of New Consultation (Rectal Prolapse ) .   Woman whose had some issues of rectal discomfort and fullness. Prior colonoscopies up in [place] in [place]. Establish with [facility] and saw Dr. Tarri Glenn. Apparently was concerns of more mucus in stools and change in bowel habits. Recommendation made for colonoscopy. Prep was poor in March so it was redone last month. Concern for some intussusception and prolapsing of the rectum. Biopsies showing irritation and colitis most likely related to prolapse. Based concerns surgical consultation offered.  She feels like the prolapsing started a few months ago but it has not gone away. Pops out not even with defecation but sometimes with regular activity. She will have total incontinence of flatus and stool. Now the point wearing diapers all the time. Had normal vaginal deliveries. No tubal ligation or C-sections.  Ready for surgery     Medical History:  Past Medical History:  Diagnosis Date  Anxiety  Arthritis  Diabetes mellitus without complication (CMS-HCC)  GERD (gastroesophageal reflux disease)  Glaucoma (increased eye pressure)  Hyperlipidemia  Hypertension   Patient Active Problem List  Diagnosis  Rectal prolapse  Chronic constipation  Insulin-requiring or dependent type II diabetes mellitus (CMS-HCC)  SUI (stress urinary incontinence, female)  Insulin pump in place   Past Surgical History:   Procedure Laterality Date  TONSILLECTOMY 1973  CATARACT EXTRACTION 2023  Lumbar Surgery  2013 (4&5) 2018 (3)    Allergies  Allergen Reactions  Doxepin Rash  Erythromycin Base Unknown  Ethyl Alcohol Other (See Comments)  Erythromycin Diarrhea, Nausea And Vomiting and Other (See Comments)  Other reaction(s): Stomach pain   Current Outpatient Medications on File Prior to Visit  Medication Sig Dispense Refill  ascorbic acid, vitamin C, (VITAMIN C) 1000 MG tablet Take 1 tablet every day by oral route.  aspirin 81 MG EC tablet Take 1 tablet every day by oral route.  aspirin 81 MG EC tablet Take 81 mg by mouth once daily  atorvastatin (LIPITOR) 10 MG tablet Take 1 tablet by mouth once daily  B-complex with vitamin C Cap Take 1 tablet by mouth once daily  calcium carbonate-vitamin D3 600 mg-20 mcg (800 unit) Tab Take 1 tablet by mouth 2 (two) times daily  cetirizine (ZYRTEC) 10 MG tablet Take 1 tablet every day by oral route as needed for 90 days.  cholecalciferol (VITAMIN D3) 2,000 unit capsule Take 1 capsule by mouth once daily  DEXCOM G6 SENSOR Devi USE 1 SENSOR EVERY 10 DAYS TO MONITOR BLOOD GLUCOSE FOUR TIMES DAILY  DOCOSAHEXAENOIC ACID ORAL Take by mouth  losartan (COZAAR) 100 MG tablet Take 1 tablet by mouth every morning  magnesium L-lactate (MAGTAB) 84 mg TbER SR tablet Take by mouth  multivitamin with minerals tablet Take 1 tablet by mouth once daily  NOVOLOG U-100 INSULIN ASPART SUBQ Inject subcutaneously  triamcinolone 0.1 % cream APPLY TO DERMATITIS ON LEGS DAILY AS NEEDED  venlafaxine (EFFEXOR) 75 MG tablet Take 1 tablet by mouth once daily  zinc sulfate (ZINCATE) 50  mg zinc (220 mg) capsule Take by mouth   No current facility-administered medications on file prior to visit.   History reviewed. No pertinent family history.   Social History   Tobacco Use  Smoking Status Former  Types: Cigarettes  Quit date: 10/15/1997  Years since quitting: 24.0  Smokeless Tobacco  Never    Social History   Socioeconomic History  Marital status: Single  Tobacco Use  Smoking status: Former  Types: Cigarettes  Quit date: 10/15/1997  Years since quitting: 24.0  Smokeless tobacco: Never  Substance and Sexual Activity  Alcohol use: Not Currently  Drug use: Never   ############################################################  Review of Systems: A complete review of systems (ROS) was obtained from the patient. I have reviewed this information and discussed as appropriate with the patient. See HPI as well for other pertinent ROS.  Constitutional: No fevers, chills, sweats. Weight stable Eyes: No vision changes, No discharge HENT: No sore throats, nasal drainage Lymph: No neck swelling, No bruising easily Pulmonary: No cough, productive sputum CV: No orthopnea, PND . No exertional chest/neck/shoulder/arm pain. Patient can walk 30 minutes without difficulty.   GI: No personal nor family history of GI/colon cancer, inflammatory bowel disease, irritable bowel syndrome, allergy such as Celiac Sprue, dietary/dairy problems, colitis, ulcers nor gastritis. No recent sick contacts/gastroenteritis. No travel outside the country. No changes in diet.  Renal: No UTIs, No hematuria Genital: No drainage, bleeding, masses Musculoskeletal: No severe joint pain. Good ROM major joints Skin: No sores or lesions Heme/Lymph: No easy bleeding. No swollen lymph nodes Neuro: No active seizures. No facial droop Psych: No hallucinations. No agitation  OBJECTIVE   Vitals:  10/23/21 1138  BP: 130/60  Pulse: 83  Temp: 36.2 C (97.1 F)  SpO2: 99%  Weight: 71.1 kg (156 lb 12.8 oz)  Height: 166.4 cm (5' 5.5")   Body mass index is 25.7 kg/m.  PHYSICAL EXAM:  Constitutional: Not cachectic. Hygeine adequate. Vitals signs as above.  Eyes: Wears glasses - vision corrected,Pupils reactive, normal extraocular movements. Sclera nonicteric Neuro: CN II-XII intact. No major focal sensory  defects. No major motor deficits. Lymph: No head/neck/groin lymphadenopathy Psych: No severe agitation. No severe anxiety. Judgment & insight Adequate, Oriented x4, HENT: Normocephalic, Mucus membranes moist. No thrush. Hearing: adequate Neck: Supple, No tracheal deviation. No obvious thyromegaly Chest: No pain to chest wall compression. Good respiratory excursion. No audible wheezing CV: Pulses intact. regular. No major extremity edema Ext: No obvious deformity or contracture. Edema: Not present. No cyanosis Skin: No major subcutaneous nodules. Warm and dry Musculoskeletal: Severe joint rigidity not present. No obvious clubbing. No digital petechiae. Mobility: no assist device moving easily without restrictions  Abdomen: Flat Soft. Nondistended. Nontender. Hernia: Not present. Diastasis recti: Not present. No hepatomegaly. No splenomegaly.  Genital/Pelvic: Inguinal hernia: Not present. Inguinal lymph nodes: without lymphadenopathy nor hidradenitis.   Rectal:   ##################################  Perianal skin Mild fecal soiling  Pruritis ani: Mild Pilonidal disease: Not present Condyloma / warts: Not present  Anal fissure: Not present Perirectal abscess/fistula Not present External hemorrhoids few external hemorrhoidal tags right posterior left lateral Mild cystocele.  No obvious rectocele. Digital and anoscopic rectal exam tolerated  Sphincter tone Weak squeeze  Hemorrhoidal piles Grade 3 prolapsing at all piles. Prostate: N/A Rectovaginal septum: Thin with no major rectocele Rectal masses: Not present  Other significant findings: Very redundant rectum that telescopes down and actually prolapses out circumferentially with straining consistent with procidentia circumferential rectal prolapse  Patient examined with assistance of female  PA-S in the room with patient in decubitus position .   ###################################    ###################################################################  Labs, Imaging and Diagnostic Testing:  Located in 'Care Everywhere' section of Epic EMR chart  PRIOR [facility] CLINIC NOTES:  Not applicable  SURGERY NOTES:  Not applicable  PATHOLOGY:  Located in [place]' section of Epic EMR chart  Assessment and Plan:  DIAGNOSES:  Diagnoses and all orders for this visit:  Rectal prolapse  Insulin-requiring or dependent type II diabetes mellitus (CMS-HCC)  Chronic constipation  SUI (stress urinary incontinence, female)  Insulin pump in place  Other orders - polyethylene glycol (MIRALAX) powder; Take 233.75 g by mouth once for 1 dose Take according to your procedure prep instructions. - bisacodyL (DULCOLAX) 5 mg EC tablet; Take 4 tablets (20 mg total) by mouth once daily as needed for Constipation for up to 1 dose - metroNIDAZOLE (FLAGYL) 500 MG tablet; Take 2 tablets (1,000 mg total) by mouth 3 (three) times daily for 3 doses SEE BOWEL PREP INSTRUCTIONS: Take 2 tablets at 2pm, 3pm, and 10pm the day prior to your colon operation. - neomycin 500 mg tablet; Take 2 tablets (1,000 mg total) by mouth 3 (three) times daily for 3 doses SEE BOWEL PREP INSTRUCTIONS: Take 2 tablets at 2pm, 3pm, and 10pm the day prior to your colon operation.    ASSESSMENT/PLAN  Pleasant active woman insulin requiring under good control with insulin pump who has worsening constipation and prolapse. Now has constant rectal prolapse with complete fecal incontinence. Frustrating.  I think she would benefit from surgery. We will plan robotic rectosigmoid resection of redundant rectum and colon I suspect is there and then stapling and suture rectopexy.  The anatomy & physiology of the digestive tract was discussed. The pathophysiology of rectal prolapse was discussed. Natural history risks without surgery was discussed. I feel the risks of no  intervention will lead to serious problems that outweigh the operative risks; therefore, I recommended surgery to treat the pathology. Possible need for sigmoid colectomy to remove redundant colon was discussed. Pexy by suture and probable mesh reinforcement was discussed as well. Laparoscopic & open techniques were discussed.   Risks such as bleeding, infection, abscess, leak, reoperation, possible ostomy, hernia, stroke, heart attack, death, and other risks were discussed. I noted a good likelihood this will help address the problem. Goals of post-operative recovery were discussed as well. We will work to minimize complications. An educational handout on the technique was given as well. Questions were answered. The patient expresses understanding & wishes to proceed with surgery.    Adin Hector, MD, FACS, MASCRS Esophageal, Gastrointestinal & Colorectal Surgery Robotic and Minimally Invasive Surgery  [facility] [address], [place], [place] [address] [phone] Fax [phone] Main  CONTACT INFORMATION:  Weekday (9AM-5PM): Call [facility] main office at [phone]  Weeknight (5PM-9AM) or Weekend/Holiday: Check [email or web address] (password " [email or web address]") for General Surgery [facility] coverage  (Please, do not use SecureChat as it is not reliable communication to reach operating surgeons for immediate patient care given surgeries/outpatient duties/clinic/cross-coverage/off post-call which would lead to a delay in care.  Epic staff messaging available for outptient concerns, but may not be answered for 48 hours or more).

## 2021-11-14 NOTE — Anesthesia Procedure Notes (Signed)
Procedure Name: Intubation Date/Time: 11/14/2021 8:42 AM  Performed by: Genelle Bal, CRNAPre-anesthesia Checklist: Patient identified, Emergency Drugs available, Suction available and Patient being monitored Patient Re-evaluated:Patient Re-evaluated prior to induction Oxygen Delivery Method: Circle system utilized Preoxygenation: Pre-oxygenation with 100% oxygen Induction Type: IV induction and Rapid sequence Laryngoscope Size: Miller and 2 Grade View: Grade I Tube type: Oral Tube size: 7.0 mm Number of attempts: 1 Airway Equipment and Method: Stylet Placement Confirmation: ETT inserted through vocal cords under direct vision, positive ETCO2 and breath sounds checked- equal and bilateral Secured at: 21 cm Tube secured with: Tape Dental Injury: Teeth and Oropharynx as per pre-operative assessment

## 2021-11-14 NOTE — Progress Notes (Signed)
This Probation officer encouraged patient to sit at the side of the bed and stand up with assistance, Patient states that she is feeling very weak to move and needs some rest . This Probation officer will re-attempt the ambulation in the a.m.

## 2021-11-15 ENCOUNTER — Encounter (HOSPITAL_COMMUNITY): Payer: Self-pay | Admitting: Surgery

## 2021-11-15 DIAGNOSIS — K623 Rectal prolapse: Secondary | ICD-10-CM

## 2021-11-15 DIAGNOSIS — J329 Chronic sinusitis, unspecified: Secondary | ICD-10-CM | POA: Insufficient documentation

## 2021-11-15 DIAGNOSIS — Z9181 History of falling: Secondary | ICD-10-CM

## 2021-11-15 LAB — BASIC METABOLIC PANEL
Anion gap: 11 (ref 5–15)
Anion gap: 6 (ref 5–15)
BUN: 30 mg/dL — ABNORMAL HIGH (ref 8–23)
BUN: 40 mg/dL — ABNORMAL HIGH (ref 8–23)
CO2: 23 mmol/L (ref 22–32)
CO2: 25 mmol/L (ref 22–32)
Calcium: 8.8 mg/dL — ABNORMAL LOW (ref 8.9–10.3)
Calcium: 9 mg/dL (ref 8.9–10.3)
Chloride: 101 mmol/L (ref 98–111)
Chloride: 97 mmol/L — ABNORMAL LOW (ref 98–111)
Creatinine, Ser: 0.96 mg/dL (ref 0.44–1.00)
Creatinine, Ser: 1.17 mg/dL — ABNORMAL HIGH (ref 0.44–1.00)
GFR, Estimated: >60 mL/min (ref >60)
GFR, Estimated: 49 mL/min — ABNORMAL LOW (ref >60)
Glucose, Bld: 213 mg/dL — ABNORMAL HIGH (ref 70–99)
Glucose, Bld: 474 mg/dL — ABNORMAL HIGH (ref 70–99)
Potassium: 4.5 mmol/L (ref 3.5–5.1)
Potassium: 4.6 mmol/L (ref 3.5–5.1)
Sodium: 135 mmol/L (ref 135–145)
Sodium: 128 mmol/L — ABNORMAL LOW (ref 135–145)

## 2021-11-15 LAB — GLUCOSE, CAPILLARY
Glucose-Capillary: 169 mg/dL — ABNORMAL HIGH (ref 70–99)
Glucose-Capillary: 253 mg/dL — ABNORMAL HIGH (ref 70–99)
Glucose-Capillary: 311 mg/dL — ABNORMAL HIGH (ref 70–99)
Glucose-Capillary: 345 mg/dL — ABNORMAL HIGH (ref 70–99)
Glucose-Capillary: 403 mg/dL — ABNORMAL HIGH (ref 70–99)
Glucose-Capillary: 407 mg/dL — ABNORMAL HIGH (ref 70–99)
Glucose-Capillary: 443 mg/dL — ABNORMAL HIGH (ref 70–99)
Glucose-Capillary: 464 mg/dL — ABNORMAL HIGH (ref 70–99)

## 2021-11-15 LAB — CBC
HCT: 27 % — ABNORMAL LOW (ref 36.0–46.0)
Hemoglobin: 9 g/dL — ABNORMAL LOW (ref 12.0–15.0)
MCH: 31.7 pg (ref 26.0–34.0)
MCHC: 33.3 g/dL (ref 30.0–36.0)
MCV: 95.1 fL (ref 80.0–100.0)
Platelets: 247 10*3/uL (ref 150–400)
RBC: 2.84 MIL/uL — ABNORMAL LOW (ref 3.87–5.11)
RDW: 12.6 % (ref 11.5–15.5)
WBC: 8.9 10*3/uL (ref 4.0–10.5)
nRBC: 0 % (ref 0.0–0.2)

## 2021-11-15 LAB — GLUCOSE, RANDOM: Glucose, Bld: 488 mg/dL — ABNORMAL HIGH (ref 70–99)

## 2021-11-15 LAB — MAGNESIUM: Magnesium: 1.6 mg/dL — ABNORMAL LOW (ref 1.7–2.4)

## 2021-11-15 LAB — BETA-HYDROXYBUTYRIC ACID: Beta-Hydroxybutyric Acid: 0.21 mmol/L (ref 0.05–0.27)

## 2021-11-15 MED ORDER — MAGNESIUM SULFATE 2 GM/50ML IV SOLN
2.0000 g | Freq: Once | INTRAVENOUS | Status: AC
  Administered 2021-11-15: 2 g via INTRAVENOUS
  Filled 2021-11-15: qty 50

## 2021-11-15 MED ORDER — LACTATED RINGERS IV BOLUS: 1000.0000 mL | Freq: Three times a day (TID) | INTRAVENOUS | Status: DC | PRN

## 2021-11-15 MED ORDER — SODIUM CHLORIDE 0.9 % IV SOLN
125.0000 mg | Freq: Once | INTRAVENOUS | Status: AC
  Administered 2021-11-15: 125 mg via INTRAVENOUS
  Filled 2021-11-15: qty 10

## 2021-11-15 MED ORDER — INSULIN PUMP
Freq: Three times a day (TID) | SUBCUTANEOUS | Status: DC
  Administered 2021-11-16: 6.5 via SUBCUTANEOUS
  Administered 2021-11-16: 6 via SUBCUTANEOUS
  Administered 2021-11-16: 8.5 via SUBCUTANEOUS
  Administered 2021-11-16: 4.5 via SUBCUTANEOUS
  Filled 2021-11-15: qty 1

## 2021-11-15 MED ORDER — LACTATED RINGERS IV BOLUS
1000.0000 mL | Freq: Once | INTRAVENOUS | Status: AC
  Administered 2021-11-15: 1000 mL via INTRAVENOUS

## 2021-11-15 MED ORDER — INSULIN ASPART 100 UNIT/ML IJ SOLN
16.0000 [IU] | Freq: Once | INTRAMUSCULAR | Status: AC
  Administered 2021-11-15: 16 [IU] via SUBCUTANEOUS

## 2021-11-15 NOTE — Progress Notes (Signed)
Re-attempted ambulation. Patient started feeling dizzy seating at the site of the bed.  BP dropped from 134/59 to 90/46 in seating position. Could not tolerate standing upright to get an accurate reading of standing BP.

## 2021-11-15 NOTE — Evaluation (Addendum)
Physical Therapy Evaluation Patient Details Name: AVALENE SEALY MRN: [id number] DOB: 10/02/1950 Today's Date: 11/15/2021  History of Present Illness  71 y.o. female admitted with complete rectal prolapse, s/p ROBOTIC LOW ANTERIOR RESECTION OF RECTOSIGMOID  RECTOPEXY,  BILATERAL  RIGID PROCTOSCOPY on 11/14/21. PMH: DM1 (has insulin pump), scoliosis, lumbar fusion 2018, glaucoma, HTN, RA.  Clinical Impression  Pt admitted with above diagnosis. Pt ambulated 140' holding an IV pole, no loss of balance. Assessed orthostatic vitals (see flowsheets), pt reported only very mild lightheadedness in standing which did not worsen with ambulation.  Pt currently with functional limitations due to the deficits listed below (see PT Problem List). Pt will benefit from skilled PT to increase their independence and safety with mobility to allow discharge to the venue listed below.          Recommendations for follow up therapy are one component of a multi-disciplinary discharge planning process, led by the attending physician.  Recommendations may be updated based on patient status, additional functional criteria and insurance authorization.  Follow Up Recommendations Home health PT      Assistance Recommended at Discharge Intermittent Supervision/Assistance  Patient can return home with the following  A little help with bathing/dressing/bathroom;Assistance with cooking/housework;Assist for transportation    Equipment Recommendations None recommended by PT  Recommendations for Other Services       Functional Status Assessment Patient has had a recent decline in their functional status and demonstrates the ability to make significant improvements in function in a reasonable and predictable amount of time.     Precautions / Restrictions Precautions Precautions: Other (comment) Precaution Comments: monitor for orthostasis; JP drain Restrictions Weight Bearing Restrictions: No      Mobility   Bed Mobility               General bed mobility comments: modified independent, HOB up, used rail    Transfers Overall transfer level: Needs assistance Equipment used: 1 person hand held assist Transfers: Sit to/from Stand Sit to Stand: Min assist           General transfer comment: VCs hand placement, min A to power up    Ambulation/Gait Ambulation/Gait assistance: Min guard Gait Distance (Feet): 140 Feet Assistive device: IV Pole Gait Pattern/deviations: Step-through pattern, Decreased stride length Gait velocity: decr     General Gait Details: steady, no loss of balance, pt denied pain and denied dizziness  Stairs            Wheelchair Mobility    Modified Rankin (Stroke Patients Only)       Balance Overall balance assessment: Modified Independent                                           Pertinent Vitals/Pain Pain Assessment Pain Assessment: No/denies pain    Home Living Family/patient expects to be discharged to:: Private residence Living Arrangements: Alone Available Help at Discharge: Family;Available PRN/intermittently Type of Home: House Home Access: Stairs to enter Entrance Stairs-Rails: Left Entrance Stairs-Number of Steps: 1   Home Layout: One level Home Equipment: Conservation officer, nature (2 wheels) Additional Comments: son staying until Sunday 10/15; neighbors/friends can check in prn    Prior Function Prior Level of Function : Independent/Modified Independent             Mobility Comments: no falls in past 6 months, walks without AD  Hand Dominance        Extremity/Trunk Assessment   Upper Extremity Assessment Upper Extremity Assessment: Defer to OT evaluation    Lower Extremity Assessment Lower Extremity Assessment: Overall WFL for tasks assessed    Cervical / Trunk Assessment Cervical / Trunk Assessment: Normal  Communication   Communication: No difficulties  Cognition Arousal/Alertness:  Awake/alert Behavior During Therapy: WFL for tasks assessed/performed Overall Cognitive Status: Within Functional Limits for tasks assessed                                          General Comments      Exercises     Assessment/Plan    PT Assessment Patient needs continued PT services  PT Problem List Decreased mobility;Decreased activity tolerance       PT Treatment Interventions Gait training;Therapeutic exercise;Functional mobility training;Therapeutic activities;Patient/family education    PT Goals (Current goals can be found in the Care Plan section)  Acute Rehab PT Goals Patient Stated Goal: walk farther PT Goal Formulation: With patient/family Time For Goal Achievement: 11/29/21    Frequency Min 3X/week     Co-evaluation               AM-PAC PT "6 Clicks" Mobility  Outcome Measure Help needed turning from your back to your side while in a flat bed without using bedrails?: A Little Help needed moving from lying on your back to sitting on the side of a flat bed without using bedrails?: A Little Help needed moving to and from a bed to a chair (including a wheelchair)?: A Little Help needed standing up from a chair using your arms (e.g., wheelchair or bedside chair)?: A Little Help needed to walk in hospital room?: A Little Help needed climbing 3-5 steps with a railing? : A Little 6 Click Score: 18    End of Session Equipment Utilized During Treatment: Gait belt Activity Tolerance: Patient tolerated treatment well Patient left: in chair;with call bell/phone within reach;with family/visitor present Nurse Communication: Mobility status PT Visit Diagnosis: Difficulty in walking, not elsewhere classified (R26.2);Other abnormalities of gait and mobility (R26.89)    Time: 1415-1446 PT Time Calculation (min) (ACUTE ONLY): 31 min   Charges:   PT Evaluation $PT Eval Moderate Complexity: 1 Mod         [name] PT  11/15/2021  Acute Rehabilitation Services  Office [phone]

## 2021-11-15 NOTE — Progress Notes (Signed)
Medical Consultation   Connie Huang  KGM:[id number]  DOB: 1950/09/03  DOA: 11/14/2021  PCP: Elby Beck, DO   Requesting physician: Dr. Harlow Asa  Reason for consultation: Diabetes management    History of Present Illness: Connie Huang is a 71 year old female with past medical history significant for type 1 diabetes mellitus, essential hypertension, hyperlipidemia, anxiety/depression who is admitted to [facility] for rectal prolapse s/p robotic resection of rectosigmoid colon by Dr. Johney Maine on 11/14/2021.  Hospitalist service consulted on 10/12 for assistance with diabetic management.  Patient at baseline utilizes an insulin pump.  Her last hemoglobin A1c 6.5 on 05/10/2021, well controlled.  Patient did receive postoperative IV Decadron.  Patient was slightly more confused this morning and has not been receiving basal insulin and currently being covered with sliding scale.  Patient now more alert and currently placing her insulin pump back on along with her Dexcom sensor.  Family present. Patient with no specific complaints at this time.  Denies headache, no dizziness, no chest pain, no palpitations, no shortness of breath, no fever/chills/night sweats, no nausea/vomiting/diarrhea, no abdominal pain, no focal weakness, no fatigue, no seizures.    Review of Systems:  As per HPI otherwise 10 point review of systems negative.     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    Hyperlipidemia    Hypertension    Hyponatremia 05/2015   Lumbar stenosis    Neuromuscular disorder ([place])    Pneumonia 05/2015   PONV (postoperative nausea and vomiting)     Past Surgical History: Past Surgical History:  Procedure Laterality Date   ANTERIOR LAT LUMBAR FUSION Left 02/15/2016   Procedure: LEFT LUMBAR THREE-FOUR ANTEROLATERAL LUMBAR INTERBODY FUSION WITH LATERAL PLATE;  Surgeon: Erline Levine, MD;  Location: [place];  Service: Neurosurgery;  Laterality: Left;   BACK SURGERY     Lumbar 4 and 5 Fusion   CATARACT EXTRACTION Bilateral 2023   [place], [place]   COLONOSCOPY  2015   last colonoscopy was done in [place]   EYE SURGERY     PROCTOSCOPY N/A 11/14/2021   Procedure: RIGID PROCTOSCOPY;  Surgeon: Michael Boston, MD;  Location: [facility];  Service: General;  Laterality: N/A;   RECTOPEXY N/A 11/14/2021   Procedure: RECTOPEXY;  Surgeon: Michael Boston, MD;  Location: [facility];  Service: General;  Laterality: N/A;   TONSILLECTOMY       Allergies:   Allergies  Allergen Reactions   Zonalon [Doxepin Hcl] Rash   Erythromycin Diarrhea, Nausea And Vomiting and Other (See Comments)    Other reaction(s): Stomach pain     Social History:  reports that she quit smoking about 24 years ago. Her smoking use included cigarettes. She has never used smokeless tobacco. She reports that she does not drink alcohol and does not use drugs.   Family History:  Family History  Problem Relation Age of Onset   Hypertension Mother    Hyperlipidemia Mother    Dementia Mother    Heart disease Father    Hyperlipidemia Father    Hypertension Father    Stroke Father    Heart attack Father    Colon cancer Neg Hx    Esophageal cancer Neg Hx    Rectal cancer Neg Hx    Stomach cancer Neg Hx     Family history reviewed and not pertinent    Physical Exam: Vitals:   11/15/21 0956 11/15/21 1010 11/15/21 1248 11/15/21 1642  BP: (!) 131/51 (!) 131/58 (!) 136/51 (!) 140/51  Pulse: (!) 108 98 (!)  103 95  Resp:   17 17  Temp:   98.3 F (36.8 C) 98.2 F (36.8 C)  TempSrc:   Oral Oral  SpO2: 96% 92% 97% 94%  Weight:      Height:        Physical Exam: GEN: NAD, alert and oriented x 3, wd/wn HEENT: NCAT, PERRL, EOMI, sclera clear, MMM PULM: CTAB w/o wheezes/crackles, normal respiratory effort CV: RRR w/o M/G/R GI: abd soft, NTND, NABS, no R/G/M MSK: no peripheral edema, muscle strength globally intact 5/5 bilateral upper/lower extremities NEURO: CN II-XII intact, no focal deficits, sensation to light touch intact PSYCH: normal mood/affect Integumentary: dry/intact, no rashes or wounds    Data reviewed:  I have personally reviewed following labs and imaging studies Labs:  CBC: Recent Labs  Lab 11/09/21 1136 11/15/21 0421  WBC 5.9 8.9  HGB 12.1 9.0*  HCT 36.3 27.0*  MCV 95.0 95.1  PLT 235 454    Basic Metabolic Panel: Recent Labs  Lab 11/09/21 1136 11/15/21 0421 11/15/21 1708  NA 136 135  --   K 4.4 4.5  --   CL 101 101  --   CO2 29 23  --   GLUCOSE 102* 213* 488*  BUN 26* 30*  --   CREATININE 0.80 0.96  --   CALCIUM 10.2 8.8*  --   MG  --  1.6*  --    GFR Estimated Creatinine Clearance: 51.1 mL/min (by C-G formula based on SCr of 0.96 mg/dL). Liver Function Tests: No results for input(s): "AST", "ALT", "ALKPHOS", "BILITOT", "PROT", "ALBUMIN" in the last 168 hours. No results for input(s): "LIPASE", "AMYLASE" in the last 168 hours. No results for input(s): "AMMONIA" in the last 168 hours. Coagulation profile No results for input(s): "INR", "PROTIME" in the last 168 hours.  Cardiac Enzymes: No results for input(s): "CKTOTAL", "CKMB", "CKMBINDEX", "TROPONINI" in the last 168 hours. BNP: Invalid input(s): "POCBNP" CBG: Recent Labs  Lab 11/14/21 1605 11/14/21 2146 11/15/21 0713 11/15/21 1148 11/15/21 1639  GLUCAP 157* 178* 253* 345* 464*   D-Dimer No results for input(s): "DDIMER" in the last 72 hours. Hgb A1c No results for input(s):  "HGBA1C" in the last 72 hours. Lipid Profile No results for input(s): "CHOL", "HDL", "LDLCALC", "TRIG", "CHOLHDL", "LDLDIRECT" in the last 72 hours. Thyroid function studies No results for input(s): "TSH", "T4TOTAL", "T3FREE", "THYROIDAB" in the last 72 hours.  Invalid input(s): "FREET3" Anemia work up No results for input(s): "VITAMINB12", "FOLATE", "FERRITIN", "TIBC", "IRON", "RETICCTPCT" in the last 72 hours. Urinalysis No results found for: "COLORURINE", "APPEARANCEUR", "LABSPEC", "PHURINE", "GLUCOSEU", "HGBUR", "BILIRUBINUR", "KETONESUR", "PROTEINUR", "UROBILINOGEN", "NITRITE", "LEUKOCYTESUR"   Microbiology No results found for this or any previous visit (from the past 240 hour(s)).     Inpatient Medications:   Scheduled Meds:  acetaminophen  1,000  mg Oral Q6H   alvimopan  12 mg Oral BID   aspirin EC  81 mg Oral Daily   atorvastatin  10 mg Oral q1800   calcium-vitamin D  1 tablet Oral BID WC   enoxaparin (LOVENOX) injection  40 mg Subcutaneous Q24H   feeding supplement  237 mL Oral BID BM   insulin aspart  0-15 Units Subcutaneous TID WC   insulin aspart  0-5 Units Subcutaneous QHS   insulin pump   Subcutaneous TID WC, HS, 0200   lip balm   Topical BID   loratadine  10 mg Oral Daily   multivitamin with minerals  1 tablet Oral Daily   polycarbophil  625 mg Oral BID   sodium chloride flush  3 mL Intravenous Q12H   venlafaxine XR  75 mg Oral Q breakfast   zinc sulfate  220 mg Oral Daily   Continuous Infusions:  sodium chloride     lactated ringers     methocarbamol (ROBAXIN) IV       Radiological Exams on Admission: No results found.  Impression/Recommendations   Type 1 diabetes mellitus, with hyperglycemia Patient currently utilizes insulin pump, usually receives 13 units of basal insulin in a 24-hour period with correction insulin of 1 unit which usually drops her glucose by 50 points.  Patient has been off her insulin pump in the perioperative setting and did  receive IV Decadron postoperatively which is likely confounding factor.  Blood sugar elevated to 464.  Did receive 11 units of correction earlier this afternoon. --Check BMP to ensure anion gap not elevated --Restart insulin pump in accordance with hospital protocol, discussed with the diabetic coordinator --CBGs 5 times daily before meals, at bedtime and 3 AM  Essential hypertension Home regimen includes losartan 100 mg p.o. daily.  Currently on hold due to concerns of slight hypotension in the perioperative period.  Currently on Vasotec as needed.  Continue monitor BP closely.  On aspirin and statin.  Hyperlipidemia Continue atorvastatin 10 mg p.o. daily  Anxiety/depression Venlafaxine 75 mg p.o. daily  Rectal prolapse s/p robotic resection of rectosigmoid colon -- Per general surgery    Thank you for the opportunity to participate in the care of this most interesting patient.  The [facility] hospitalist team will continue to follow the patient with you.   Time spent: 45 minutes spent on chart review, discussion with nursing staff, consultants, updating family and interview/physical exam; more than 50% of that time was spent in counseling and/or coordination of care.   Autumm Hattery J [place] DO [facility] Available via Epic secure chat 7am-7pm After these hours, please refer to coverage provider listed on [email or web address] 11/15/2021, 5:43 PM

## 2021-11-15 NOTE — Progress Notes (Signed)
PT Cancellation Note  Patient Details Name: Connie Huang MRN: [id number] DOB: 08/20/1950   Cancelled Treatment:    Reason Eval/Treat Not Completed: Medical issues which prohibited therapy (noted pt was orthostatic with nursing earlier this morning. She reports she's dizzy at rest in supine, and was dizzy getting up to a bedside commode recently. Will check back later today.)  [name] PT 11/15/2021  [place]  Office [phone]

## 2021-11-15 NOTE — TOC Progression Note (Signed)
Transition of Care [facility]) - Progression Note    Patient Details  Name: Connie Huang MRN: [id number] Date of Birth: 1950/08/14  [facility] CM/SW Contact  Purcell Mouton, RN Phone Number: 11/15/2021, 8:26 AM  Clinical Narrative:    TOC screened chart. Will continue to follow for discharge needs.   Expected Discharge Plan: Home/Self Care Barriers to Discharge: No Barriers Identified  Expected Discharge Plan and Services Expected Discharge Plan: Home/Self Care       Living arrangements for the past 2 months: Single Family Home                                       Social Determinants of Health (SDOH) Interventions    Readmission Risk Interventions     No data to display

## 2021-11-15 NOTE — Evaluation (Signed)
Occupational Therapy Evaluation Patient Details Name: Connie Huang MRN: [id number] DOB: [birth year] Today's Date: 11/15/2021   History of Present Illness 71 y.o. female admitted with complete rectal prolapse, s/p ROBOTIC LOW ANTERIOR RESECTION OF RECTOSIGMOID  RECTOPEXY,  BILATERAL  RIGID PROCTOSCOPY on 11/14/21. PMH: DM1 (has insulin pump), scoliosis, lumbar fusion 2018, glaucoma, HTN, RA.   Clinical Impression   Ms. Connie Huang is a 71 y/o women with above medical history. Prior to hospitalization patient was independent for ADLs, and lives alone. Patient now presents with decreased activity tolerance, decreased balance, pain,and decreased strength. Patient supervision for supine to side, and min A for sit to stand. Patient ambulated ~50 feet- min guard. Patient min A for toilet transfer and mod A for toileting-pericare and clothing manipulation. Patient states her son will stay with her until 10/15. However after he leaves she will not have physical  assistance- her friend plans to stay, but patient is fearful of a fall and no physical assistance. Therapist does not believe she will require assistance with ADLs, but would need physical assistance with IADLs. Educated patient and son on compensatory strategies for IADLs. Will follow acutely while in hospital but do not expect OT needs at discharge.   Recommendations for follow up therapy are one component of a multi-disciplinary discharge planning process, led by the attending physician.  Recommendations may be updated based on patient status, additional functional criteria and insurance authorization.   Follow Up Recommendations  No OT follow up    Assistance Recommended at Discharge Intermittent Supervision/Assistance  Patient can return home with the following A little help with bathing/dressing/bathroom;Assistance with cooking/housework;A little help with walking and/or transfers;Assist for transportation;Help with stairs or  ramp for entrance    Functional Status Assessment  Patient has had a recent decline in their functional status and demonstrates the ability to make significant improvements in function in a reasonable and predictable amount of time.  Equipment Recommendations  None recommended by OT    Recommendations for Other Services       Precautions / Restrictions Precautions Precautions: Other (comment) Precaution Comments: monitor for orthostasis, JP drain Restrictions Weight Bearing Restrictions: No      Mobility Bed Mobility Overal bed mobility: Modified Independent             General bed mobility comments: HOB up, used rail    Transfers Overall transfer level: Needs assistance Equipment used: 1 person hand held assist Transfers: Sit to/from Stand Sit to Stand: Min assist           General transfer comment: VCs hand placement, min A to power up      Balance Overall balance assessment: Modified Independent                                         ADL either performed or assessed with clinical judgement   ADL Overall ADL's : Needs assistance/impaired Eating/Feeding: Sitting;Independent   Grooming: Set up;Sitting   Upper Body Bathing: Min guard;Sitting   Lower Body Bathing: Sit to/from stand;Minimal assistance   Upper Body Dressing : Set up;Sitting   Lower Body Dressing: Minimal assistance;Sit to/from stand   Toilet Transfer: Minimal assistance;BSC/3in1   Toileting- Water quality scientist and Hygiene: Moderate assistance Toileting - Clothing Manipulation Details (indicate cue type and reason): required assistance for pericare and clothing manipulation     Functional mobility during ADLs: Minimal assistance  Vision Baseline Vision/History: 1 Wears glasses       Perception     Praxis      Pertinent Vitals/Pain Pain Assessment Pain Assessment: No/denies pain     Hand Dominance Right   Extremity/Trunk Assessment Upper  Extremity Assessment Upper Extremity Assessment: RUE deficits/detail;LUE deficits/detail RUE Deficits / Details: ROM-WFL shoulder 5/5 elbow 5/5 RUE Sensation: WNL RUE Coordination: WNL LUE Deficits / Details: ROM-WFL shoulder 5/5 elbow 5/5 LUE Sensation: WNL LUE Coordination: WNL   Lower Extremity Assessment Lower Extremity Assessment: Defer to PT evaluation   Cervical / Trunk Assessment Cervical / Trunk Assessment: Normal   Communication Communication Communication: No difficulties   Cognition Arousal/Alertness: Awake/alert Behavior During Therapy: WFL for tasks assessed/performed Overall Cognitive Status: Within Functional Limits for tasks assessed                                       General Comments       Exercises     Shoulder Instructions      Home Living Family/patient expects to be discharged to:: Private residence Living Arrangements: Alone Available Help at Discharge: Family;Available PRN/intermittently Type of Home: House Home Access: Stairs to enter Entrance Stairs-Number of Steps: 1 Entrance Stairs-Rails: Left Home Layout: One level     Bathroom Shower/Tub: Occupational psychologist: Standard     Home Equipment: Conservation officer, nature (2 wheels);Shower seat;Hand held shower head;Grab bars - tub/shower;Grab bars - toilet   Additional Comments: son staying until Sunday 10/15; neighbors/friends can check in prn      Prior Functioning/Environment Prior Level of Function : Independent/Modified Independent             Mobility Comments: no falls in past 6 months, walks without AD          OT Problem List: Decreased strength;Decreased knowledge of use of DME or AE;Decreased activity tolerance;Pain      OT Treatment/Interventions: Self-care/ADL training;Therapeutic exercise;Therapeutic activities;DME and/or AE instruction    OT Goals(Current goals can be found in the care plan section) Acute Rehab OT Goals Patient Stated  Goal: to go home OT Goal Formulation: With patient Time For Goal Achievement: 11/29/21 Potential to Achieve Goals: Good ADL Goals Pt Will Transfer to Toilet: Independently;regular height toilet Pt Will Perform Toileting - Clothing Manipulation and hygiene: Independently Additional ADL Goal #1: Patient will perform 10 min functional activity or exercise activity as evidence of improving activity tolerance  OT Frequency: Min 2X/week    Co-evaluation              AM-PAC OT "6 Clicks" Daily Activity     Outcome Measure Help from another person eating meals?: None Help from another person taking care of personal grooming?: A Little Help from another person toileting, which includes using toliet, bedpan, or urinal?: A Lot Help from another person bathing (including washing, rinsing, drying)?: A Little Help from another person to put on and taking off regular upper body clothing?: A Little Help from another person to put on and taking off regular lower body clothing?: A Little 6 Click Score: 18   End of Session Equipment Utilized During Treatment: Gait belt Nurse Communication: Mobility status (Patient did well with therapy. Needs assistance with pericare.)  Activity Tolerance: Patient tolerated treatment well Patient left: in chair;with call bell/phone within reach;with chair alarm set  OT Visit Diagnosis: Muscle weakness (generalized) (M62.81);Pain  Time: 1415-1450 OT Time Calculation (min): 35 min Charges:  OT General Charges $OT Visit: 1 Visit OT Evaluation $OT Eval Low Complexity: [place], OTS Acute rehab services   Charlann Lange 11/15/2021, 4:56 PM

## 2021-11-15 NOTE — Inpatient Diabetes Management (Addendum)
Inpatient Diabetes Program Recommendations  AACE/ADA: New Consensus Statement on Inpatient Glycemic Control (2015)  Target Ranges:  Prepandial:   less than 140 mg/dL      Peak postprandial:   less than 180 mg/dL (1-2 hours)      Critically ill patients:  140 - 180 mg/dL   Lab Results  Component Value Date   ZSMOLM 786 (H) 11/15/2021   HGBA1C 6.9 09/18/2021    Review of Glycemic Control  Diabetes history: DM1 Outpatient Diabetes medications: Insulin pump Current orders for Inpatient glycemic control: Novolog 0-15 TID with meals and 0-5 HS  Basal rate of 0.55 units/hr with active run time of 4 hrs, continue with Target BG of 100-150 for safety purposes. and keep same insulin sensitivity factor of 50, continue insulin carb ratio to 1:8 to help control her postprandial hyperglycemia.  HgbA1C - 6.9%   Inpatient Diabetes Program Recommendations:    Add Semglee 6 units QHS  Decrease Novolog to 0-9 units TID with meals and 0-5 HS  Add Novolog 4 units TID with meals if eating > 50%  Restart insulin pump when pt feels ready and wait to restart basal rate at dinner on 10/13.  Follow closely.  Thank you. Lorenda Peck, RD, LDN, CDCES Inpatient Diabetes Coordinator [phone]   Addendum:  CBG 464 at 1639. Needs BMET to make sure pt is not going into DKA.

## 2021-11-15 NOTE — Progress Notes (Addendum)
Connie Huang [id number] 02-16-1950  CARE TEAM:  PCP: Elby Beck, DO  Outpatient Care Team: Patient Care Team: Elby Beck, DO as PCP - General (Family Medicine) Michael Boston, MD as Consulting Physician (General Surgery) [place], MD as Consulting Physician (Gastroenterology)  Inpatient Treatment Team: Treatment Team: Attending Provider: Michael Boston, MD; Registered Nurse: Kerney Elbe, RN; Physical Therapist: Lucile Crater, PT; Technician: Jeralyn Ruths, NT; Pharmacist: Luiz Ochoa, [facility]; Occupational Therapist: Lenward Chancellor, OT; Utilization Review: Rolland Porter, RN   Problem List:   Principal Problem:   Complete rectal prolapse Active Problems:   Scoliosis of lumbosacral spine   Long term (current) use of insulin ([place])   Adjustment disorder   Allergic rhinitis   At risk for falls   Cervical radiculopathy   Chronic constipation   Mixed hyperlipidemia   Benign essential hypertension   Hypertensive heart disease without congestive heart failure   Insulin pump in place   Polyneuropathy due to type 1 diabetes mellitus ([place])   Rheumatoid arthritis ([place])   SUI (stress urinary incontinence, female)  11/14/2021  POST-OPERATIVE DIAGNOSIS:  RECTAL PROLAPSE   PROCEDURE:   ROBOTIC LOW ANTERIOR RESECTION OF RECTOSIGMOID RECTOPEXY TRANSVERSUS ABDOMINIS PLANE (TAP) BLOCK - BILATERAL RIGID PROCTOSCOPY   SURGEON:  Adin Hector, MD    Procedure(s): ROBOTIC LOW ANTERIOR RESECTION OF RECTOSIGMOID WITH BILATERAL TAP BLOCK RECTOPEXY RIGID PROCTOSCOPY  FINDINGS: Patient had very redundant rectosigmoid colon.  Chronic prolapse.  Ended up resecting about 60 cm of redundancy. No obvious metastatic disease on visceral parietal peritoneum or liver.   The anastomosis rests 10 cm from the anal verge by rigid proctoscopy.  It is a distal-descending-colon to mid-rectal 29 EEA stapled anastomosis   Assessment  Fair but  stable  [facility] Stay = 1 days)  Plan:  -I think she came in a little dehydrated.  We will try and rehydrate to help things out.  Follow orthostasis. -Normally on blood pressure medicine but will continue to hold these until her blood pressure stabilizes -Some acute blood loss anemia in the setting of iron deficiency anemia and anticoagulation.  We will give IV iron to help stabilize things and follow. -Insulin requiring diabetic normally on insulin pump.  See if we can get her back on there.  I have diabetes care involved as well.  Sliding scale for now. -Talked her about doing Kegel exercises to help strengthen pelvic floor and sphincter tone.  I did caution she probably have some incontinence to flatus and stools for a while but hopefully now that the rectum is pexied back in and trim down, recurrent prolapse should go away. -With her near fall episode, keep her on fall precautions.  Have physical and Occupational Therapy evaluate to see if she would benefit from home health or other needs.  We will see. -VTE prophylaxis- SCDs, enoxaparin.  Follow hemoglobin. -D/C Foley per protocol.  I&O cath as needed. -mobilize as tolerated to help recovery  Disposition:  Disposition:  The patient is from: Home  Anticipate discharge to:  Home with Home Health  Anticipated Date of Discharge is:  October 14,2023    Barriers to discharge:  Pending Clinical improvement (more likely than not)  Patient currently is NOT MEDICALLY STABLE for discharge from the hospital from a surgery standpoint.      I reviewed nursing notes, last 24 h vitals and pain scores, last 48 h intake and output, last 24 h labs and trends, and last  24 h imaging results. I have reviewed this patient's available data, including medical history, events of note, test results, etc as part of my evaluation.  A significant portion of that time was spent in counseling.  Care during the described time interval was provided by  me.  This care required moderate level of medical decision making.  11/15/2021    Subjective: (Chief complaint)  Patient felt lightheaded and dizzy and slumped to the floor last night.  No true syncope.  Some lightheadedness and orthostasis getting IV fluid bolus.  Some soreness mostly controlled with Tylenol.  No more nausea or vomiting.  Objective:  Vital signs:  Vitals:   11/15/21 0132 11/15/21 0515 11/15/21 0519 11/15/21 0529  BP: 133/61 (!) 134/59 (!) 90/46 (!) 130/51  Pulse: 100 (!) 105 (!) 108 (!) 102  Resp: '16 16 16   '$ Temp: 98.9 F (37.2 C) 98.3 F (36.8 C)  98.3 F (36.8 C)  TempSrc: Oral Oral    SpO2: 99% 92% 96% 95%  Weight:      Height:           Intake/Output   Yesterday:  10/11 0701 - 10/12 0700 In: 3059.6 [P.O.:390; I.V.:2569.6; IV Piggyback:100] Out: 1650 [Urine:1300; Drains:320; Blood:30] This shift:  No intake/output data recorded.  Bowel function:  Flatus: YES  BM:  YES -loose with some incontinence.  Drain: Serosanguinous   Physical Exam:  General: Pt awake/alert in no acute distress.  A little tired but chatty.  Interactive.  Not pale or toxic. Eyes: PERRL, normal EOM.  Sclera clear.  No icterus Neuro: CN II-XII intact w/o focal sensory/motor deficits. Lymph: No head/neck/groin lymphadenopathy Psych:  No delerium/psychosis/paranoia.  Oriented x 4 HENT: Normocephalic, Mucus membranes moist.  No thrush Neck: Supple, No tracheal deviation.  No obvious thyromegaly Chest: No pain to chest wall compression.  Good respiratory excursion.  No audible wheezing CV:  Pulses intact.  Regular rhythm.  No major extremity edema MS: Normal AROM mjr joints.  No obvious deformity  Abdomen: Soft.  Nondistended.  Mildly tender at incisions only.  No evidence of peritonitis.  No incarcerated hernias.  Ext:   No deformity.  No mjr edema.  No cyanosis Skin: No petechiae / purpurea.  No major sores.  Warm and dry    Results:   Cultures: No  results found for this or any previous visit (from the past 720 hour(s)).  Labs: Results for orders placed or performed during the hospital encounter of 11/14/21 (from the past 48 hour(s))  Glucose, capillary     Status: Abnormal   Collection Time: 11/14/21  7:01 AM  Result Value Ref Range   Glucose-Capillary 129 (H) 70 - 99 mg/dL    Comment: Glucose reference range applies only to samples taken after fasting for at least 8 hours.  Glucose, capillary     Status: Abnormal   Collection Time: 11/14/21  9:04 AM  Result Value Ref Range   Glucose-Capillary 116 (H) 70 - 99 mg/dL    Comment: Glucose reference range applies only to samples taken after fasting for at least 8 hours.  Glucose, capillary     Status: Abnormal   Collection Time: 11/14/21 10:15 AM  Result Value Ref Range   Glucose-Capillary 192 (H) 70 - 99 mg/dL    Comment: Glucose reference range applies only to samples taken after fasting for at least 8 hours.  Glucose, capillary     Status: Abnormal   Collection Time: 11/14/21 11:07 AM  Result Value Ref  Range   Glucose-Capillary 227 (H) 70 - 99 mg/dL    Comment: Glucose reference range applies only to samples taken after fasting for at least 8 hours.  Glucose, capillary     Status: Abnormal   Collection Time: 11/14/21 12:02 PM  Result Value Ref Range   Glucose-Capillary 199 (H) 70 - 99 mg/dL    Comment: Glucose reference range applies only to samples taken after fasting for at least 8 hours.  Glucose, capillary     Status: Abnormal   Collection Time: 11/14/21 12:26 PM  Result Value Ref Range   Glucose-Capillary 187 (H) 70 - 99 mg/dL    Comment: Glucose reference range applies only to samples taken after fasting for at least 8 hours.  Glucose, capillary     Status: Abnormal   Collection Time: 11/14/21  4:05 PM  Result Value Ref Range   Glucose-Capillary 157 (H) 70 - 99 mg/dL    Comment: Glucose reference range applies only to samples taken after fasting for at least 8 hours.   Glucose, capillary     Status: Abnormal   Collection Time: 11/14/21  9:46 PM  Result Value Ref Range   Glucose-Capillary 178 (H) 70 - 99 mg/dL    Comment: Glucose reference range applies only to samples taken after fasting for at least 8 hours.  Basic metabolic panel     Status: Abnormal   Collection Time: 11/15/21  4:21 AM  Result Value Ref Range   Sodium 135 135 - 145 mmol/L   Potassium 4.5 3.5 - 5.1 mmol/L   Chloride 101 98 - 111 mmol/L   CO2 23 22 - 32 mmol/L   Glucose, Bld 213 (H) 70 - 99 mg/dL    Comment: Glucose reference range applies only to samples taken after fasting for at least 8 hours.   BUN 30 (H) 8 - 23 mg/dL   Creatinine, Ser 0.96 0.44 - 1.00 mg/dL   Calcium 8.8 (L) 8.9 - 10.3 mg/dL   GFR, Estimated >60 >60 mL/min    Comment: (NOTE) Calculated using the CKD-EPI Creatinine Equation (2021)    Anion gap 11 5 - 15    Comment: Performed at [facility], [place] [address] [place], [place] [address]  CBC     Status: Abnormal   Collection Time: 11/15/21  4:21 AM  Result Value Ref Range   WBC 8.9 4.0 - 10.5 K/uL   RBC 2.84 (L) 3.87 - 5.11 MIL/uL   Hemoglobin 9.0 (L) 12.0 - 15.0 g/dL   HCT 27.0 (L) 36.0 - 46.0 %   MCV 95.1 80.0 - 100.0 fL   MCH 31.7 26.0 - 34.0 pg   MCHC 33.3 30.0 - 36.0 g/dL   RDW 12.6 11.5 - 15.5 %   Platelets 247 150 - 400 K/uL   nRBC 0.0 0.0 - 0.2 %    Comment: Performed at [facility], [place] [address], [place], [place] [address]  Magnesium     Status: Abnormal   Collection Time: 11/15/21  4:21 AM  Result Value Ref Range   Magnesium 1.6 (L) 1.7 - 2.4 mg/dL    Comment: Performed at [facility], [place] [address] [place], [place] [address]  Glucose, capillary     Status: Abnormal   Collection Time: 11/15/21  7:13 AM  Result Value Ref Range   Glucose-Capillary 253 (H) 70 - 99 mg/dL    Comment: Glucose reference range applies only to samples taken after fasting for at least  8 hours.     Imaging / Studies: No results found.  Medications / Allergies: per chart  Antibiotics: Anti-infectives (From admission, onward)    Start     Dose/Rate Route Frequency Ordered Stop   11/14/21 2100  cefoTEtan (CEFOTAN) 2 g in sodium chloride 0.9 % 100 mL IVPB        2 g 200 mL/hr over 30 Minutes Intravenous Every 12 hours 11/14/21 1403 11/14/21 2238   11/14/21 1400  neomycin (MYCIFRADIN) tablet 1,000 mg  Status:  Discontinued       See Hyperspace for full Linked Orders Report.   1,000 mg Oral 3 times per day 11/14/21 0639 11/14/21 1344   11/14/21 1400  metroNIDAZOLE (FLAGYL) tablet 1,000 mg  Status:  Discontinued       See Hyperspace for full Linked Orders Report.   1,000 mg Oral 3 times per day 11/14/21 0639 11/14/21 1344   11/14/21 0645  cefoTEtan (CEFOTAN) 2 g in sodium chloride 0.9 % 100 mL IVPB        2 g 200 mL/hr over 30 Minutes Intravenous On call to O.R. 11/14/21 1791 11/14/21 0920         Note: Portions of this report may have been transcribed using voice recognition software. Every effort was made to ensure accuracy; however, inadvertent computerized transcription errors may be present.   Any transcriptional errors that result from this process are unintentional.    Adin Hector, MD, FACS, MASCRS Esophageal, Gastrointestinal & Colorectal Surgery Robotic and Minimally Invasive Surgery  [facility] [place]. [address], [place], [place] [address] [phone] Fax [phone] Main  CONTACT INFORMATION:  Weekday (9AM-5PM): Call [facility] main office at [phone]  Weeknight (5PM-9AM) or Weekend/Holiday: Check [email or web address] (password " [email or web address]") for General Surgery [facility] coverage  (Please, do not use SecureChat as it is not reliable communication to reach operating surgeons for immediate patient care given surgeries/outpatient duties/clinic/cross-coverage/off post-call which would lead to a delay in care.   Epic staff messaging available for outptient concerns, but may not be answered for 48 hours or more).     11/15/2021  8:10 AM

## 2021-11-15 NOTE — Progress Notes (Signed)
       CROSS COVER NOTE  NAME: Connie Huang MRN: [id number] DOB : 22-Jul-1950    Date of Service   11/15/2021   HPI/Events of Note   Notified by bedside RN of persistent hyperglycemic episodes, CBG's > 400.  Per RN, insulin administration has been done by the patient via her personal insulin pump.  No complications with the insulin pump have been reported.  The patient last gave herself 8 units of insulin at 1858 when her glucose was 443.  Current CBG 403.  Patient has been instructed to infuse an additional 16 units now.  RN will repeat CBG in 1 hour.  BHA level ordered.  Update:  Repeat CBG (2150) 311.  At this moment, patient is refusing HS coverage as her Dexcom sensor is reading glucose level ~ 280.  RN was asked to repeat CBG in 1 to 2 hours to confirm glucose level within range.  Labs reviewed: BHA wnl.  Anion gap closed.  CO2 WNL. Her last hemoglobin A1c 6.5 on 05/10/2021, well controlled.  Hyperglycemic episodes are likely related to a combination of receiving IV Decadron and not receiving basal insulin.   Interventions/ Plan   Additional insulin coverage Monitor for hypoglycemic and hypoglycemic episodes     Raenette Rover, DNP, [place]

## 2021-11-15 NOTE — Progress Notes (Addendum)
Patient usually controls her cbg with her personal pump, but since surgery has been groggy and has not felt comfortable starting her pump back. We have been using our sliding scale to control patient's cbg. Was able to get her pump back on when patient felt ok doing so, so that she would have a basal insulin. Patient CBG is currently 443, patient administered 8 units per personal pump. Patient administered 6 unit bolus @ 1700 when cbg was 464. Attending MD has seen patient and is aware of patient's elevated cbgs. New orders were placed for patient to administer insulin per her personal pump.

## 2021-11-16 DIAGNOSIS — K623 Rectal prolapse: Secondary | ICD-10-CM | POA: Diagnosis not present

## 2021-11-16 LAB — BASIC METABOLIC PANEL
Anion gap: 5 (ref 5–15)
BUN: 30 mg/dL — ABNORMAL HIGH (ref 8–23)
CO2: 29 mmol/L (ref 22–32)
Calcium: 9.4 mg/dL (ref 8.9–10.3)
Chloride: 101 mmol/L (ref 98–111)
Creatinine, Ser: 0.87 mg/dL (ref 0.44–1.00)
GFR, Estimated: >60 mL/min (ref >60)
Glucose, Bld: 113 mg/dL — ABNORMAL HIGH (ref 70–99)
Potassium: 4.5 mmol/L (ref 3.5–5.1)
Sodium: 135 mmol/L (ref 135–145)

## 2021-11-16 LAB — CBC
HCT: 21.2 % — ABNORMAL LOW (ref 36.0–46.0)
Hemoglobin: 7 g/dL — ABNORMAL LOW (ref 12.0–15.0)
MCH: 31.5 pg (ref 26.0–34.0)
MCHC: 33 g/dL (ref 30.0–36.0)
MCV: 95.5 fL (ref 80.0–100.0)
Platelets: 200 K/uL (ref 150–400)
RBC: 2.22 MIL/uL — ABNORMAL LOW (ref 3.87–5.11)
RDW: 13.2 % (ref 11.5–15.5)
WBC: 9 K/uL (ref 4.0–10.5)
nRBC: 0 % (ref 0.0–0.2)

## 2021-11-16 LAB — GLUCOSE, CAPILLARY
Glucose-Capillary: 119 mg/dL — ABNORMAL HIGH (ref 70–99)
Glucose-Capillary: 129 mg/dL — ABNORMAL HIGH (ref 70–99)
Glucose-Capillary: 144 mg/dL — ABNORMAL HIGH (ref 70–99)
Glucose-Capillary: 279 mg/dL — ABNORMAL HIGH (ref 70–99)
Glucose-Capillary: 261 mg/dL — ABNORMAL HIGH (ref 70–99)
Glucose-Capillary: 243 mg/dL — ABNORMAL HIGH (ref 70–99)
Glucose-Capillary: 410 mg/dL — ABNORMAL HIGH (ref 70–99)

## 2021-11-16 LAB — FERRITIN: Ferritin: 123 ng/mL (ref 11–307)

## 2021-11-16 LAB — VITAMIN B12: Vitamin B-12: 2394 pg/mL — ABNORMAL HIGH (ref 180–914)

## 2021-11-16 LAB — RETICULOCYTES
Immature Retic Fract: 13.6 % (ref 2.3–15.9)
RBC.: 2.21 MIL/uL — ABNORMAL LOW (ref 3.87–5.11)
Retic Count, Absolute: 49.9 K/uL (ref 19.0–186.0)
Retic Ct Pct: 2.3 % (ref 0.4–3.1)

## 2021-11-16 LAB — IRON AND TIBC
Iron: 174 ug/dL — ABNORMAL HIGH (ref 28–170)
Saturation Ratios: 82 % — ABNORMAL HIGH (ref 10.4–31.8)
TIBC: 211 ug/dL — ABNORMAL LOW (ref 250–450)
UIBC: 37 ug/dL

## 2021-11-16 LAB — SURGICAL PATHOLOGY

## 2021-11-16 LAB — HEMOGLOBIN AND HEMATOCRIT, BLOOD
HCT: 24.6 % — ABNORMAL LOW (ref 36.0–46.0)
Hemoglobin: 8 g/dL — ABNORMAL LOW (ref 12.0–15.0)

## 2021-11-16 LAB — PREPARE RBC (CROSSMATCH)

## 2021-11-16 LAB — HEMOGLOBIN: Hemoglobin: 6.8 g/dL — CL (ref 12.0–15.0)

## 2021-11-16 LAB — FOLATE: Folate: 33.2 ng/mL (ref >5.9)

## 2021-11-16 MED ORDER — VITAMIN C 500 MG PO TABS
500.0000 mg | ORAL_TABLET | Freq: Two times a day (BID) | ORAL | Status: DC
  Administered 2021-11-16 – 2021-11-17 (×3): 500 mg via ORAL
  Filled 2021-11-16 (×4): qty 1

## 2021-11-16 MED ORDER — INSULIN ASPART 100 UNIT/ML IJ SOLN
8.0000 [IU] | Freq: Once | INTRAMUSCULAR | Status: AC
  Administered 2021-11-16: 8 [IU] via SUBCUTANEOUS

## 2021-11-16 MED ORDER — ENOXAPARIN SODIUM 40 MG/0.4ML IJ SOSY
40.0000 mg | PREFILLED_SYRINGE | INTRAMUSCULAR | Status: DC
  Filled 2021-11-16: qty 0.4

## 2021-11-16 MED ORDER — SODIUM CHLORIDE 0.9% IV SOLUTION: Freq: Once | INTRAVENOUS | Status: AC

## 2021-11-16 MED ORDER — FERROUS SULFATE 325 (65 FE) MG PO TABS
325.0000 mg | ORAL_TABLET | Freq: Two times a day (BID) | ORAL | Status: DC
  Administered 2021-11-16 – 2021-11-17 (×2): 325 mg via ORAL
  Filled 2021-11-16 (×2): qty 1

## 2021-11-16 NOTE — Progress Notes (Signed)
Physical Therapy Treatment Patient Details Name: Connie Huang MRN: [id number] DOB: [birth year] Today's Date: 11/16/2021   History of Present Illness 71 y.o. female admitted with complete rectal prolapse, s/p ROBOTIC LOW ANTERIOR RESECTION OF RECTOSIGMOID  RECTOPEXY,  BILATERAL  RIGID PROCTOSCOPY on 11/14/21. PMH: DM1 (has insulin pump), scoliosis, lumbar fusion 2018, glaucoma, HTN, RA.    PT Comments    Pt received from OT and ambulated good distance in hallway.  Pt reports overall feeling better today.  Pt returned to bed once back in room and SpO2 96% on room air (so did not reapply O2 [place], RN aware and agreeable).  Pt anticipates good progress.   Recommendations for follow up therapy are one component of a multi-disciplinary discharge planning process, led by the attending physician.  Recommendations may be updated based on patient status, additional functional criteria and insurance authorization.  Follow Up Recommendations  Home health PT     Assistance Recommended at Discharge Intermittent Supervision/Assistance  Patient can return home with the following Assistance with cooking/housework;Assist for transportation   Equipment Recommendations  None recommended by PT    Recommendations for Other Services       Precautions / Restrictions Precautions Precautions: Other (comment) Precaution Comments: insulin pump, R JP drain     Mobility  Bed Mobility Overal bed mobility: Modified Independent                  Transfers Overall transfer level: Needs assistance Equipment used: None Transfers: Sit to/from Stand Sit to Stand: Supervision           General transfer comment: pt received at doorway from OT, able to sit down on bed with supervision    Ambulation/Gait Ambulation/Gait assistance: Min guard Gait Distance (Feet): 400 Feet Assistive device: None Gait Pattern/deviations: Step-through pattern, Decreased stride length Gait velocity: decr      General Gait Details: mild unsteadiness however no overt LOB observed; did not have any UE support today; pt reports ambulation slower then her usual; denies symptoms   Stairs             Wheelchair Mobility    Modified Rankin (Stroke Patients Only)       Balance                                            Cognition Arousal/Alertness: Awake/alert Behavior During Therapy: WFL for tasks assessed/performed Overall Cognitive Status: Within Functional Limits for tasks assessed                                          Exercises      General Comments        Pertinent Vitals/Pain Pain Assessment Pain Assessment: No/denies pain    Home Living                          Prior Function            PT Goals (current goals can now be found in the care plan section) Progress towards PT goals: Progressing toward goals    Frequency    Min 3X/week      PT Plan Current plan remains appropriate    Co-evaluation  AM-PAC PT "6 Clicks" Mobility   Outcome Measure  Help needed turning from your back to your side while in a flat bed without using bedrails?: A Little Help needed moving from lying on your back to sitting on the side of a flat bed without using bedrails?: A Little Help needed moving to and from a bed to a chair (including a wheelchair)?: A Little Help needed standing up from a chair using your arms (e.g., wheelchair or bedside chair)?: A Little Help needed to walk in hospital room?: A Little Help needed climbing 3-5 steps with a railing? : A Little 6 Click Score: 18    End of Session Equipment Utilized During Treatment: Gait belt Activity Tolerance: Patient tolerated treatment well Patient left: in bed;with call bell/phone within reach;with family/visitor present Nurse Communication: Mobility status PT Visit Diagnosis: Difficulty in walking, not elsewhere classified (R26.2)     Time:  9518-8416 PT Time Calculation (min) (ACUTE ONLY): 12 min  Charges:  $Gait Training: 8-22 mins                     Arlyce Dice, DPT Physical Therapist Acute Rehabilitation Services Preferred contact method: Secure Chat Weekend Pager Only: [phone] Office: [place] 11/16/2021, 4:21 PM

## 2021-11-16 NOTE — Progress Notes (Signed)
Lab called with HGB of 6.6. Dr. [place] was notified. A unit of blood is ready to give.

## 2021-11-16 NOTE — Progress Notes (Signed)
Date and time results received: 11/16/21 0540 am (use smartphrase ".now" to insert current time)  Test: Hgb Critical Value: Hgb 6.8  Name of Provider Notified: Raenette Rover, NP  Orders Received? Or Actions Taken?: new orders made and carried out.

## 2021-11-16 NOTE — Progress Notes (Signed)
PROGRESS NOTE    Connie Huang  VQQ:[id number] DOB: July 09, 1950 DOA: 11/14/2021 PCP: Elby Beck, DO    Brief Narrative:   Connie Huang is a 71 y.o. female with past medical history significant for type 1 diabetes mellitus, essential hypertension, hyperlipidemia, anxiety/depression who is admitted to [facility] for rectal prolapse s/p robotic resection of rectosigmoid colon by Dr. Johney Maine on 11/14/2021.  Hospitalist service consulted on 10/12 for assistance with diabetic management.  Patient at baseline utilizes an insulin pump.  Her last hemoglobin A1c 6.5 on 05/10/2021, well controlled.  Patient did receive postoperative IV Decadron.  Patient was slightly more confused this morning and has not been receiving basal insulin and currently being covered with sliding scale.  Patient now more alert and currently placing her insulin pump back on along with her Dexcom sensor.  Family present. Patient with no specific complaints at this time.  Denies headache, no dizziness, no chest pain, no palpitations, no shortness of breath, no fever/chills/night sweats, no nausea/vomiting/diarrhea, no abdominal pain, no focal weakness, no fatigue, no seizures.  [facility] consulted for assistance with diabetic management  Assessment & Plan:   Type 1 diabetes mellitus, with hyperglycemia Patient currently utilizes insulin pump, usually receives 13 units of basal insulin in a 24-hour period with correction insulin of 1 unit which usually drops her glucose by 50 points.  Patient has been off her insulin pump in the perioperative setting and did receive IV Decadron postoperatively which is likely confounding factor.  BMP was reassuring without anion gap acidosis. --Diabetic educator following, appreciate assistance --Continue insulin pump in accordance with hospital protocol --CBGs 5 times daily before meals, at bedtime and 3 AM  Anemia, postoperative Hemoglobin 9.0 yesterday, dropped to 6.6 this  morning.  Notable blood in JP drain. --Check anemia panel --Transfuse 1 unit PRBC today; H/H 2 hours following transfusion --CBC daily   Essential hypertension Home regimen includes losartan 100 mg p.o. daily.  Currently on hold due to concerns of slight hypotension in the perioperative period.  Currently on Vasotec as needed.  Continue monitor BP closely.  On aspirin and statin.   Hyperlipidemia Continue atorvastatin 10 mg p.o. daily   Anxiety/depression Venlafaxine 75 mg p.o. daily   Rectal prolapse s/p robotic resection of rectosigmoid colon -- Per general surgery   DVT prophylaxis: enoxaparin (LOVENOX) injection 40 mg Start: 11/18/21 0800 SCD's Start: 11/14/21 1404    Code Status: Full Code Family Communication: No family present at bedside this morning  Disposition Plan:  Level of care: Med-Surg Status is: Inpatient Remains inpatient appropriate because: Receiving blood transfusion, further disposition per primary, general surgery    Procedures:  Robotic low anterior resection of rectosigmoid, Dr. Johney Maine 10/12  Antimicrobials:  Perioperative cefotetan   Subjective: Patient seen examined bedside, resting comfortably.  Lying in bed.  Awaiting breakfast.  States "cannot wait for my coffee".  Patient's hemoglobin notably dropped from 9.0-6.6 this morning.  Discussed blood transfusion.  Seen by general surgery earlier this morning.  Glucose now under better control following reinitiation of her insulin pump.  Reports some mild abdominal soreness to surgical site.  No other specific complaints or concerns at this time.  Denies headache, no dizziness, no visual changes, no chest pain, no palpitations, no cough/congestion, no fever/chills/night sweats, no nausea/vomiting/diarrhea, no focal weakness, no fatigue, no paresthesias.  No other acute events overnight or concerns this morning per nursing staff.  Objective: Vitals:   11/15/21 1248 11/15/21 1642 11/15/21 2012 11/15/21  2014  BP: (!) 136/51 (!) 140/51 (!) 115/51   Pulse: (!) 103 95 98 98  Resp: '17 17 18   '$ Temp: 98.3 F (36.8 C) 98.2 F (36.8 C) 98.6 F (37 C)   TempSrc: Oral Oral Oral   SpO2: 97% 94% (!) 89% 95%  Weight:      Height:        Intake/Output Summary (Last 24 hours) at 11/16/2021 0930 Last data filed at 11/16/2021 4665 Gross per 24 hour  Intake 928.28 ml  Output 945 ml  Net -16.72 ml   Filed Weights   11/14/21 0640  Weight: 69.4 kg    Examination:  Physical Exam: GEN: NAD, alert and oriented x 3, wd/wn HEENT: NCAT, PERRL, EOMI, sclera clear, MMM PULM: CTAB w/o wheezes/crackles, normal respiratory effort, on room air CV: RRR w/o M/G/R GI: abd soft, nondistended, mild tenderness surrounding JP drain and surgical incision site, honeycomb dressing noted and JP drain with blood within bulb, NABS, no R/G/M MSK: no peripheral edema, muscle strength globally intact 5/5 bilateral upper/lower extremities NEURO: CN II-XII intact, no focal deficits, sensation to light touch intact PSYCH: normal mood/affect Integumentary: Abdominal exam as above, otherwise no other concerning rashes/lesions/wounds noted on exposed skin surfaces    Data Reviewed: I have personally reviewed following labs and imaging studies  CBC: Recent Labs  Lab 11/09/21 1136 11/15/21 0421 11/16/21 0405 11/16/21 0743  WBC 5.9 8.9  --  9.0  HGB 12.1 9.0* 6.8* 7.0*  HCT 36.3 27.0*  --  21.2*  MCV 95.0 95.1  --  95.5  PLT 235 247  --  993   Basic Metabolic Panel: Recent Labs  Lab 11/09/21 1136 11/15/21 0421 11/15/21 1708 11/15/21 1800 11/16/21 0405  NA 136 135  --  128* 135  K 4.4 4.5  --  4.6 4.5  CL 101 101  --  97* 101  CO2 29 23  --  25 29  GLUCOSE 102* 213* 488* 474* 113*  BUN 26* 30*  --  40* 30*  CREATININE 0.80 0.96  --  1.17* 0.87  CALCIUM 10.2 8.8*  --  9.0 9.4  MG  --  1.6*  --   --   --    GFR: Estimated Creatinine Clearance: 56.4 mL/min (by C-G formula based on SCr of 0.87  mg/dL). Liver Function Tests: No results for input(s): "AST", "ALT", "ALKPHOS", "BILITOT", "PROT", "ALBUMIN" in the last 168 hours. No results for input(s): "LIPASE", "AMYLASE" in the last 168 hours. No results for input(s): "AMMONIA" in the last 168 hours. Coagulation Profile: No results for input(s): "INR", "PROTIME" in the last 168 hours. Cardiac Enzymes: No results for input(s): "CKTOTAL", "CKMB", "CKMBINDEX", "TROPONINI" in the last 168 hours. BNP (last 3 results) No results for input(s): "PROBNP" in the last 8760 hours. HbA1C: No results for input(s): "HGBA1C" in the last 72 hours. CBG: Recent Labs  Lab 11/15/21 2137 11/15/21 2357 11/16/21 0211 11/16/21 0534 11/16/21 0741  GLUCAP 311* 169* 119* 129* 144*   Lipid Profile: No results for input(s): "CHOL", "HDL", "LDLCALC", "TRIG", "CHOLHDL", "LDLDIRECT" in the last 72 hours. Thyroid Function Tests: No results for input(s): "TSH", "T4TOTAL", "FREET4", "T3FREE", "THYROIDAB" in the last 72 hours. Anemia Panel: Recent Labs    11/16/21 0740  RETICCTPCT 2.3   Sepsis Labs: No results for input(s): "PROCALCITON", "LATICACIDVEN" in the last 168 hours.  No results found for this or any previous visit (from the past 240 hour(s)).       Radiology Studies: No results  found.      Scheduled Meds:  sodium chloride   Intravenous Once   acetaminophen  1,000 mg Oral Q6H   aspirin EC  81 mg Oral Daily   atorvastatin  10 mg Oral q1800   calcium-vitamin D  1 tablet Oral BID WC   [START ON 11/18/2021] enoxaparin (LOVENOX) injection  40 mg Subcutaneous Q24H   feeding supplement  237 mL Oral BID BM   insulin aspart  0-15 Units Subcutaneous TID WC   insulin aspart  0-5 Units Subcutaneous QHS   insulin pump   Subcutaneous TID WC, HS, 0200   lip balm   Topical BID   loratadine  10 mg Oral Daily   multivitamin with minerals  1 tablet Oral Daily   polycarbophil  625 mg Oral BID   sodium chloride flush  3 mL Intravenous Q12H    venlafaxine XR  75 mg Oral Q breakfast   zinc sulfate  220 mg Oral Daily   Continuous Infusions:  sodium chloride     lactated ringers     methocarbamol (ROBAXIN) IV       LOS: 2 days    Time spent: 49 minutes spent on chart review, discussion with nursing staff, consultants, updating family and interview/physical exam; more than 50% of that time was spent in counseling and/or coordination of care.    Aleyah Balik J [place] DO [facility] Available via Epic secure chat 7am-7pm After these hours, please refer to coverage provider listed on [email or web address] 11/16/2021, 9:30 AM

## 2021-11-16 NOTE — Progress Notes (Signed)
Connie Huang [id number] [birth year]  CARE TEAM:  PCP: Elby Beck, DO  Outpatient Care Team: Patient Care Team: Elby Beck, DO as PCP - General (Family Medicine) Michael Boston, MD as Consulting Physician (General Surgery) Thornton Park, MD as Consulting Physician (Gastroenterology)  Inpatient Treatment Team: Treatment Team: Attending Provider: Michael Boston, MD; Charge Nurse: Arminda Resides, RN; Rounding Team: Ian Bushman, MD; Consulting Physician: [place] Eric J, DO; Physical Therapist: Lucile Crater, PT; Charge Nurse: Aura Dials, RN; Pharmacist: Karren Cobble, [facility]; Occupational Therapist: Lenward Chancellor, OT; Occupational Therapist: Charlann Lange, Student-OT   Problem List:   Principal Problem:   Complete rectal prolapse Active Problems:   Scoliosis of lumbosacral spine   Long term (current) use of insulin ([place])   Adjustment disorder   Allergic rhinitis   At risk for falls   Cervical radiculopathy   Chronic constipation   Mixed hyperlipidemia   Benign essential hypertension   Hypertensive heart disease without congestive heart failure   Insulin pump in place   Polyneuropathy due to type 1 diabetes mellitus ([place])   Rheumatoid arthritis ([place])   SUI (stress urinary incontinence, female)  11/14/2021  POST-OPERATIVE DIAGNOSIS:  RECTAL PROLAPSE   PROCEDURE:   ROBOTIC LOW ANTERIOR RESECTION OF RECTOSIGMOID RECTOPEXY TRANSVERSUS ABDOMINIS PLANE (TAP) BLOCK - BILATERAL RIGID PROCTOSCOPY   SURGEON:  Adin Hector, MD    Procedure(s): ROBOTIC LOW ANTERIOR RESECTION OF RECTOSIGMOID WITH BILATERAL TAP BLOCK RECTOPEXY RIGID PROCTOSCOPY  FINDINGS: Patient had very redundant rectosigmoid colon.  Chronic prolapse.  Ended up resecting about 60 cm of redundancy. No obvious metastatic disease on visceral parietal peritoneum or liver.   The anastomosis rests 10 cm from the anal verge by rigid proctoscopy.  It is a  distal-descending-colon to mid-rectal 29 EEA stapled anastomosis   Assessment  Stabilizing  [facility] Stay = 2 days)  Plan:  Patient feels much better overall but has precipitous top of hemoglobin.  Recheck to see if it is the gentleman since she does not look pale and she feels much better and is no longer orthostatic.  Did have bloody fluid from her drain but that appears to be tapering off.  We will recheck hemoglobin.  Plan to transfuse 1 unit.  Hold enoxaparin for now and regroup.  SCDs and mobilization.  Normally on blood pressure medicine but will continue to hold losartan with as needed backup these until her blood pressure stabilizes  Some acute blood loss anemia in the setting of iron deficiency anemia and anticoagulation.  We will give IV iron to help stabilize things and follow.  -Talked her about doing Kegel exercises to help strengthen pelvic floor and sphincter tone.  I did caution she probably have some incontinence to flatus and stools for a while but hopefully now that the rectum is pexied back in and trim down, recurrent prolapse should go away.  Had her on sliding scale insulin but now she is back on her insulin pump.  Medicine help appreciated.  Got hyperglycemic.  Agree most likely was related to her perioperative Decadron.  Seems to be under better control now.  -With her near fall episode, keep her on fall precautions.  Have physical and Occupational Therapy evaluate to see if she would benefit from home health or other needs.  So for physical therapy feels home health would be of use.  Occupational Therapy does not feel strongly since her ADLs are okay.  We will see.  -D/C Foley per protocol.  I&O cath as needed.    Disposition:  Disposition:  The patient is from: Home  Anticipate discharge to:  Home with Home Health  Anticipated Date of Discharge is:  October 14,2023    Barriers to discharge:  Pending Clinical improvement (more likely than  not)  Patient currently is NOT MEDICALLY STABLE for discharge from the hospital from a surgery standpoint.      I reviewed nursing notes, last 24 h vitals and pain scores, last 48 h intake and output, last 24 h labs and trends, and last 24 h imaging results. I have reviewed this patient's available data, including medical history, events of note, test results, etc as part of my evaluation.  A significant portion of that time was spent in counseling.  Care during the described time interval was provided by me.  This care required moderate level of medical decision making.  11/16/2021    Subjective: (Chief complaint)  Patient feels much better overall.  Not lightheaded or dizzy.  Having some loose bowel movements better.    Dumped some old blood but seems to be tapering off.  Nursing in room.  Objective:  Vital signs:  Vitals:   11/15/21 1248 11/15/21 1642 11/15/21 2012 11/15/21 2014  BP: (!) 136/51 (!) 140/51 (!) 115/51   Pulse: (!) 103 95 98 98  Resp: '17 17 18   '$ Temp: 98.3 F (36.8 C) 98.2 F (36.8 C) 98.6 F (37 C)   TempSrc: Oral Oral Oral   SpO2: 97% 94% (!) 89% 95%  Weight:      Height:        Last BM Date : 11/15/21  Intake/Output   Yesterday:  10/12 0701 - 10/13 0700 In: 928.3 [P.O.:780; IV Piggyback:148.3] Out: 1085 [Urine:700; Drains:385] This shift:  No intake/output data recorded.  Bowel function:  Flatus: YES  BM:  YES -loose with some incontinence.  Drain: Serosanguinous   Physical Exam:  General: Pt awake/alert in no acute distress.  Right and alert.  Sitting up smiling. Eyes: PERRL, normal EOM.  Sclera clear.  No icterus Neuro: CN II-XII intact w/o focal sensory/motor deficits. Lymph: No head/neck/groin lymphadenopathy Psych:  No delerium/psychosis/paranoia.  Oriented x 4 HENT: Normocephalic, Mucus membranes moist.  No thrush Neck: Supple, No tracheal deviation.  No obvious thyromegaly Chest: No pain to chest wall compression.   Good respiratory excursion.  No audible wheezing CV:  Pulses intact.  Regular rhythm.  No major extremity edema MS: Normal AROM mjr joints.  No obvious deformity  Abdomen: Soft.  Nondistended.  Mildly tender at incisions only.  No evidence of peritonitis.  No incarcerated hernias.  Ext:   No deformity.  No mjr edema.  No cyanosis Skin: No petechiae / purpurea.  No major sores.  Warm and dry    Results:   Cultures: No results found for this or any previous visit (from the past 720 hour(s)).  Labs: Results for orders placed or performed during the hospital encounter of 11/14/21 (from the past 48 hour(s))  Glucose, capillary     Status: Abnormal   Collection Time: 11/14/21  9:04 AM  Result Value Ref Range   Glucose-Capillary 116 (H) 70 - 99 mg/dL    Comment: Glucose reference range applies only to samples taken after fasting for at least 8 hours.  Glucose, capillary     Status: Abnormal   Collection Time: 11/14/21 10:15 AM  Result Value Ref Range   Glucose-Capillary 192 (H) 70 - 99 mg/dL    Comment:  Glucose reference range applies only to samples taken after fasting for at least 8 hours.  Glucose, capillary     Status: Abnormal   Collection Time: 11/14/21 11:07 AM  Result Value Ref Range   Glucose-Capillary 227 (H) 70 - 99 mg/dL    Comment: Glucose reference range applies only to samples taken after fasting for at least 8 hours.  Glucose, capillary     Status: Abnormal   Collection Time: 11/14/21 12:02 PM  Result Value Ref Range   Glucose-Capillary 199 (H) 70 - 99 mg/dL    Comment: Glucose reference range applies only to samples taken after fasting for at least 8 hours.  Glucose, capillary     Status: Abnormal   Collection Time: 11/14/21 12:26 PM  Result Value Ref Range   Glucose-Capillary 187 (H) 70 - 99 mg/dL    Comment: Glucose reference range applies only to samples taken after fasting for at least 8 hours.  Glucose, capillary     Status: Abnormal   Collection Time:  11/14/21  4:05 PM  Result Value Ref Range   Glucose-Capillary 157 (H) 70 - 99 mg/dL    Comment: Glucose reference range applies only to samples taken after fasting for at least 8 hours.  Glucose, capillary     Status: Abnormal   Collection Time: 11/14/21  9:46 PM  Result Value Ref Range   Glucose-Capillary 178 (H) 70 - 99 mg/dL    Comment: Glucose reference range applies only to samples taken after fasting for at least 8 hours.  Basic metabolic panel     Status: Abnormal   Collection Time: 11/15/21  4:21 AM  Result Value Ref Range   Sodium 135 135 - 145 mmol/L   Potassium 4.5 3.5 - 5.1 mmol/L   Chloride 101 98 - 111 mmol/L   CO2 23 22 - 32 mmol/L   Glucose, Bld 213 (H) 70 - 99 mg/dL    Comment: Glucose reference range applies only to samples taken after fasting for at least 8 hours.   BUN 30 (H) 8 - 23 mg/dL   Creatinine, Ser 0.96 0.44 - 1.00 mg/dL   Calcium 8.8 (L) 8.9 - 10.3 mg/dL   GFR, Estimated >60 >60 mL/min    Comment: (NOTE) Calculated using the CKD-EPI Creatinine Equation (2021)    Anion gap 11 5 - 15    Comment: Performed at [facility], [place] [address], [place], [place] [address]  CBC     Status: Abnormal   Collection Time: 11/15/21  4:21 AM  Result Value Ref Range   WBC 8.9 4.0 - 10.5 K/uL   RBC 2.84 (L) 3.87 - 5.11 MIL/uL   Hemoglobin 9.0 (L) 12.0 - 15.0 g/dL   HCT 27.0 (L) 36.0 - 46.0 %   MCV 95.1 80.0 - 100.0 fL   MCH 31.7 26.0 - 34.0 pg   MCHC 33.3 30.0 - 36.0 g/dL   RDW 12.6 11.5 - 15.5 %   Platelets 247 150 - 400 K/uL   nRBC 0.0 0.0 - 0.2 %    Comment: Performed at [facility], [place] [address] [place], [place] [address]  Magnesium     Status: Abnormal   Collection Time: 11/15/21  4:21 AM  Result Value Ref Range   Magnesium 1.6 (L) 1.7 - 2.4 mg/dL    Comment: Performed at [facility], [place] [address] [place], [place] [address]  Glucose, capillary     Status: Abnormal   Collection Time:  11/15/21   7:13 AM  Result Value Ref Range   Glucose-Capillary 253 (H) 70 - 99 mg/dL    Comment: Glucose reference range applies only to samples taken after fasting for at least 8 hours.  Glucose, capillary     Status: Abnormal   Collection Time: 11/15/21 11:48 AM  Result Value Ref Range   Glucose-Capillary 345 (H) 70 - 99 mg/dL    Comment: Glucose reference range applies only to samples taken after fasting for at least 8 hours.  Glucose, capillary     Status: Abnormal   Collection Time: 11/15/21  4:39 PM  Result Value Ref Range   Glucose-Capillary 464 (H) 70 - 99 mg/dL    Comment: Glucose reference range applies only to samples taken after fasting for at least 8 hours.  Glucose, random     Status: Abnormal   Collection Time: 11/15/21  5:08 PM  Result Value Ref Range   Glucose, Bld 488 (H) 70 - 99 mg/dL    Comment: Glucose reference range applies only to samples taken after fasting for at least 8 hours. Performed at [facility], [place] [address] [place], [place] [address]   Glucose, capillary     Status: Abnormal   Collection Time: 11/15/21  5:56 PM  Result Value Ref Range   Glucose-Capillary 403 (H) 70 - 99 mg/dL    Comment: Glucose reference range applies only to samples taken after fasting for at least 8 hours.  Basic metabolic panel     Status: Abnormal   Collection Time: 11/15/21  6:00 PM  Result Value Ref Range   Sodium 128 (L) 135 - 145 mmol/L    Comment: DELTA CHECK NOTED   Potassium 4.6 3.5 - 5.1 mmol/L   Chloride 97 (L) 98 - 111 mmol/L   CO2 25 22 - 32 mmol/L   Glucose, Bld 474 (H) 70 - 99 mg/dL    Comment: Glucose reference range applies only to samples taken after fasting for at least 8 hours.   BUN 40 (H) 8 - 23 mg/dL   Creatinine, Ser 1.17 (H) 0.44 - 1.00 mg/dL   Calcium 9.0 8.9 - 10.3 mg/dL   GFR, Estimated 49 (L) >60 mL/min    Comment: (NOTE) Calculated using the CKD-EPI Creatinine Equation (2021)    Anion gap 6 5 - 15    Comment: Performed at  [facility], [place] [address] [place], [place] [address]  Glucose, capillary     Status: Abnormal   Collection Time: 11/15/21  6:32 PM  Result Value Ref Range   Glucose-Capillary 443 (H) 70 - 99 mg/dL    Comment: Glucose reference range applies only to samples taken after fasting for at least 8 hours.  Glucose, capillary     Status: Abnormal   Collection Time: 11/15/21  7:48 PM  Result Value Ref Range   Glucose-Capillary 407 (H) 70 - 99 mg/dL    Comment: Glucose reference range applies only to samples taken after fasting for at least 8 hours.  Beta-hydroxybutyric acid     Status: None   Collection Time: 11/15/21  9:06 PM  Result Value Ref Range   Beta-Hydroxybutyric Acid 0.21 0.05 - 0.27 mmol/L    Comment: Performed at [facility], [place] [address] [place],  [address]  Glucose, capillary     Status: Abnormal   Collection Time: 11/15/21  9:37 PM  Result Value Ref Range   Glucose-Capillary 311 (H) 70 - 99 mg/dL  Comment: Glucose reference range applies only to samples taken after fasting for at least 8 hours.  Glucose, capillary     Status: Abnormal   Collection Time: 11/15/21 11:57 PM  Result Value Ref Range   Glucose-Capillary 169 (H) 70 - 99 mg/dL    Comment: Glucose reference range applies only to samples taken after fasting for at least 8 hours.  Glucose, capillary     Status: Abnormal   Collection Time: 11/16/21  2:11 AM  Result Value Ref Range   Glucose-Capillary 119 (H) 70 - 99 mg/dL    Comment: Glucose reference range applies only to samples taken after fasting for at least 8 hours.  Hemoglobin     Status: Abnormal   Collection Time: 11/16/21  4:05 AM  Result Value Ref Range   Hemoglobin 6.8 (LL) 12.0 - 15.0 g/dL    Comment: REPEATED TO VERIFY THIS CRITICAL RESULT HAS VERIFIED AND BEEN CALLED TO RIMANDO,R BY MAHMOUD,SELMA ON 10 13 2023 AT 0604, AND HAS BEEN READ BACK.  Performed at [facility], [place]  [address] [place], [place] [address]   Basic metabolic panel     Status: Abnormal   Collection Time: 11/16/21  4:05 AM  Result Value Ref Range   Sodium 135 135 - 145 mmol/L    Comment: DELTA CHECK NOTED   Potassium 4.5 3.5 - 5.1 mmol/L   Chloride 101 98 - 111 mmol/L   CO2 29 22 - 32 mmol/L   Glucose, Bld 113 (H) 70 - 99 mg/dL    Comment: Glucose reference range applies only to samples taken after fasting for at least 8 hours.   BUN 30 (H) 8 - 23 mg/dL   Creatinine, Ser 0.87 0.44 - 1.00 mg/dL   Calcium 9.4 8.9 - 10.3 mg/dL   GFR, Estimated >60 >60 mL/min    Comment: (NOTE) Calculated using the CKD-EPI Creatinine Equation (2021)    Anion gap 5 5 - 15    Comment: Performed at [facility], [place] [address] [place],  [address]  Glucose, capillary     Status: Abnormal   Collection Time: 11/16/21  5:34 AM  Result Value Ref Range   Glucose-Capillary 129 (H) 70 - 99 mg/dL    Comment: Glucose reference range applies only to samples taken after fasting for at least 8 hours.    Imaging / Studies: No results found.  Medications / Allergies: per chart  Antibiotics: Anti-infectives (From admission, onward)    Start     Dose/Rate Route Frequency Ordered Stop   11/14/21 2100  cefoTEtan (CEFOTAN) 2 g in sodium chloride 0.9 % 100 mL IVPB        2 g 200 mL/hr over 30 Minutes Intravenous Every 12 hours 11/14/21 1403 11/14/21 2238   11/14/21 1400  neomycin (MYCIFRADIN) tablet 1,000 mg  Status:  Discontinued       See Hyperspace for full Linked Orders Report.   1,000 mg Oral 3 times per day 11/14/21 0639 11/14/21 1344   11/14/21 1400  metroNIDAZOLE (FLAGYL) tablet 1,000 mg  Status:  Discontinued       See Hyperspace for full Linked Orders Report.   1,000 mg Oral 3 times per day 11/14/21 0639 11/14/21 1344   11/14/21 0645  cefoTEtan (CEFOTAN) 2 g in sodium chloride 0.9 % 100 mL IVPB        2 g 200 mL/hr over 30 Minutes Intravenous On call to O.R. 11/14/21 4193  11/14/21 0920  Note: Portions of this report may have been transcribed using voice recognition software. Every effort was made to ensure accuracy; however, inadvertent computerized transcription errors may be present.   Any transcriptional errors that result from this process are unintentional.    Adin Hector, MD, FACS, MASCRS Esophageal, Gastrointestinal & Colorectal Surgery Robotic and Minimally Invasive Surgery  [facility]. [address], [place], [place] [address] [phone] Fax [phone] Main  CONTACT INFORMATION:  Weekday (9AM-5PM): Call [facility] main office at [phone]  Weeknight (5PM-9AM) or Weekend/Holiday: Check [email or web address] (password " [email or web address]") for General Surgery [facility] coverage  (Please, do not use SecureChat as it is not reliable communication to reach operating surgeons for immediate patient care given surgeries/outpatient duties/clinic/cross-coverage/off post-call which would lead to a delay in care.  Epic staff messaging available for outptient concerns, but may not be answered for 48 hours or more).     11/16/2021  7:30 AM

## 2021-11-16 NOTE — Progress Notes (Signed)
Occupational Therapy Treatment Patient Details Name: Connie Huang MRN: [id number] DOB: 04-May-1950 Today's Date: 11/16/2021   History of present illness 71 y.o. female admitted with complete rectal prolapse, s/p ROBOTIC LOW ANTERIOR RESECTION OF RECTOSIGMOID  RECTOPEXY,  BILATERAL  RIGID PROCTOSCOPY on 11/14/21. PMH: DM1 (has insulin pump), scoliosis, lumbar fusion 2018, glaucoma, HTN, RA.   OT comments  Treatment focused out of bed ADLs while monitoring BP for safety. Patient min guard for ambulation to/from bathroom, toileting, and grooming activities- washing hands and brushing teeth. Patient stated feeling a lot better than yesterday and showed no physical symptoms during therapy. BP in sitting 147/66 and 135/60 in standing. After OT session PT came in room and began their session. Patient is continuing to progress well. Continue poc,.    Recommendations for follow up therapy are one component of a multi-disciplinary discharge planning process, led by the attending physician.  Recommendations may be updated based on patient status, additional functional criteria and insurance authorization.    Follow Up Recommendations  No OT follow up    Assistance Recommended at Discharge    Patient can return home with the following  A little help with bathing/dressing/bathroom;Assistance with cooking/housework;A little help with walking and/or transfers;Assist for transportation;Help with stairs or ramp for entrance   Equipment Recommendations  None recommended by OT    Recommendations for Other Services      Precautions / Restrictions Precautions Precautions: Other (comment) Precaution Comments: insulin pump, R JP drain Restrictions Weight Bearing Restrictions: No       Mobility Bed Mobility Overal bed mobility:  (found in chair)                  Transfers   Equipment used: None Transfers: Sit to/from Stand Sit to Stand: Min guard                 Balance  Overall balance assessment: Modified Independent                                         ADL either performed or assessed with clinical judgement   ADL Overall ADL's : Needs assistance/impaired     Grooming: Standing;Oral care;Min guard                   Toilet Transfer: Economist and Hygiene: Sit to/from stand;Min guard              Extremity/Trunk Assessment Upper Extremity Assessment Upper Extremity Assessment: RUE deficits/detail;LUE deficits/detail RUE Deficits / Details: ROM-WFL shoulder 5/5 elbow 5/5 RUE Sensation: WNL RUE Coordination: WNL LUE Deficits / Details: ROM-WFL shoulder 5/5 elbow 5/5 LUE Sensation: WNL LUE Coordination: WNL   Lower Extremity Assessment Lower Extremity Assessment: Defer to PT evaluation   Cervical / Trunk Assessment Cervical / Trunk Assessment: Normal    Vision       Perception     Praxis      Cognition Arousal/Alertness: Awake/alert Behavior During Therapy: WFL for tasks assessed/performed Overall Cognitive Status: Within Functional Limits for tasks assessed                                          Exercises      Shoulder Instructions  General Comments      Pertinent Vitals/ Pain       Pain Assessment Pain Assessment: No/denies pain  Home Living                                          Prior Functioning/Environment              Frequency  Min 2X/week        Progress Toward Goals  OT Goals(current goals can now be found in the care plan section)  Progress towards OT goals: Progressing toward goals     Plan Discharge plan remains appropriate    Co-evaluation                 AM-PAC OT "6 Clicks" Daily Activity     Outcome Measure   Help from another person eating meals?: None Help from another person taking care of personal grooming?: A Little Help from another person  toileting, which includes using toliet, bedpan, or urinal?: A Little   Help from another person to put on and taking off regular upper body clothing?: A Little Help from another person to put on and taking off regular lower body clothing?: A Little 6 Click Score: 16    End of Session Equipment Utilized During Treatment: Gait belt  OT Visit Diagnosis: Muscle weakness (generalized) (M62.81);Pain   Activity Tolerance Patient tolerated treatment well   Patient Left in chair;with call bell/phone within reach;with chair alarm set   Nurse Communication Mobility status (monitored bp, 147/66 in sitting and 135/60 in standing.)        Time: 1423-1450 OT Time Calculation (min): 27 min  Charges: OT General Charges $OT Visit: 1 Visit OT Treatments $Self Care/Home Management : 23-37 mins  Charlann Lange, OTS Acute rehab services   Charlann Lange 11/16/2021, 5:03 PM

## 2021-11-16 NOTE — Progress Notes (Incomplete)
       CROSS COVER NOTE  NAME: Connie Huang MRN: [id number] DOB : 01-05-1951    Date of Service   11/15/2021   HPI/Events of Note   Notified by bedside RN of persistent hyperglycemic episodes, CBG's > 400.  Per RN, insulin administration has been done by the patient via her personal insulin pump.  No complications with the insulin pump have been reported.  The patient last gave herself 8 units of insulin at 1858 when her glucose was 443.  Current CBG 403.  Patient has been instructed to infuse an additional 16 units now.  RN will repeat CBG in 1 hour.    Repeat CBG (2150) 311.   BHA wnl.  Anion gap closed.  CO2 WNL. Her last hemoglobin A1c 6.5 on 05/10/2021, well controlled.    Interventions/ Plan   X X X       Raenette Rover, DNP, [place]

## 2021-11-16 NOTE — Progress Notes (Signed)
Patients blood sugar is 407, paged on call provider Raenette Rover, NP with new orders to give 16 units of insulin. Explained to the patient about the plan treatment and she agreed. Patient's blood sugar was checked again after an hour and shows a blood sugar level of 311, notified on call provider about the repeat and was instructed that the patient has a ordered night coverage. Patient refused night coverage as she verbalized " my blood sugar is still going down and dropping ". On call provider was notified and ordered to have a re check of blood sugar after another hour. CBG 169 was taken after an hour.We will continue to monitor.

## 2021-11-17 ENCOUNTER — Other Ambulatory Visit (HOSPITAL_COMMUNITY): Payer: Self-pay

## 2021-11-17 DIAGNOSIS — K623 Rectal prolapse: Secondary | ICD-10-CM | POA: Diagnosis not present

## 2021-11-17 LAB — BPAM RBC
Blood Product Expiration Date: 202311132359
ISSUE DATE / TIME: 202310130957
Unit Type and Rh: 5100

## 2021-11-17 LAB — TYPE AND SCREEN
ABO/RH(D): O POS
Antibody Screen: NEGATIVE
Unit division: 0

## 2021-11-17 LAB — HEMOGLOBIN: Hemoglobin: 8.1 g/dL — ABNORMAL LOW (ref 12.0–15.0)

## 2021-11-17 LAB — GLUCOSE, CAPILLARY
Glucose-Capillary: 322 mg/dL — ABNORMAL HIGH (ref 70–99)
Glucose-Capillary: 298 mg/dL — ABNORMAL HIGH (ref 70–99)
Glucose-Capillary: 295 mg/dL — ABNORMAL HIGH (ref 70–99)

## 2021-11-17 LAB — CREATININE, SERUM
Creatinine, Ser: 0.85 mg/dL (ref 0.44–1.00)
GFR, Estimated: >60 mL/min (ref >60)

## 2021-11-17 LAB — POTASSIUM: Potassium: 4.3 mmol/L (ref 3.5–5.1)

## 2021-11-17 NOTE — Plan of Care (Signed)
  Problem: Education: Goal: Understanding of discharge needs will improve Outcome: Adequate for Discharge Goal: Verbalization of understanding of the causes of altered bowel function will improve Outcome: Adequate for Discharge   Problem: Activity: Goal: Ability to tolerate increased activity will improve Outcome: Adequate for Discharge   Problem: Bowel/Gastric: Goal: Gastrointestinal status for postoperative course will improve Outcome: Adequate for Discharge   Problem: Health Behavior/Discharge Planning: Goal: Identification of community resources to assist with postoperative recovery needs will improve Outcome: Adequate for Discharge   Problem: Nutritional: Goal: Will attain and maintain optimal nutritional status will improve Outcome: Adequate for Discharge   Problem: Clinical Measurements: Goal: Postoperative complications will be avoided or minimized Outcome: Adequate for Discharge   Problem: Respiratory: Goal: Respiratory status will improve Outcome: Adequate for Discharge   Problem: Skin Integrity: Goal: Will show signs of wound healing Outcome: Adequate for Discharge   Problem: Education: Goal: Knowledge of General Education information will improve Description: Including pain rating scale, medication(s)/side effects and non-pharmacologic comfort measures Outcome: Adequate for Discharge   Problem: Health Behavior/Discharge Planning: Goal: Ability to manage health-related needs will improve Outcome: Adequate for Discharge   Problem: Clinical Measurements: Goal: Ability to maintain clinical measurements within normal limits will improve Outcome: Adequate for Discharge Goal: Will remain free from infection Outcome: Adequate for Discharge Goal: Diagnostic test results will improve Outcome: Adequate for Discharge Goal: Respiratory complications will improve Outcome: Adequate for Discharge Goal: Cardiovascular complication will be avoided Outcome: Adequate for  Discharge   Problem: Activity: Goal: Risk for activity intolerance will decrease Outcome: Adequate for Discharge   Problem: Nutrition: Goal: Adequate nutrition will be maintained Outcome: Adequate for Discharge   Problem: Coping: Goal: Level of anxiety will decrease Outcome: Adequate for Discharge   Problem: Elimination: Goal: Will not experience complications related to bowel motility Outcome: Adequate for Discharge Goal: Will not experience complications related to urinary retention Outcome: Adequate for Discharge   Problem: Pain Managment: Goal: General experience of comfort will improve Outcome: Adequate for Discharge   Problem: Safety: Goal: Ability to remain free from injury will improve Outcome: Adequate for Discharge   Problem: Skin Integrity: Goal: Risk for impaired skin integrity will decrease Outcome: Adequate for Discharge   Problem: Education: Goal: Ability to describe self-care measures that may prevent or decrease complications (Diabetes Survival Skills Education) will improve Outcome: Adequate for Discharge Goal: Individualized Educational Video(s) Outcome: Adequate for Discharge   Problem: Coping: Goal: Ability to adjust to condition or change in health will improve Outcome: Adequate for Discharge   Problem: Fluid Volume: Goal: Ability to maintain a balanced intake and output will improve Outcome: Adequate for Discharge   Problem: Health Behavior/Discharge Planning: Goal: Ability to identify and utilize available resources and services will improve Outcome: Adequate for Discharge Goal: Ability to manage health-related needs will improve Outcome: Adequate for Discharge   Problem: Metabolic: Goal: Ability to maintain appropriate glucose levels will improve Outcome: Adequate for Discharge   Problem: Nutritional: Goal: Maintenance of adequate nutrition will improve Outcome: Adequate for Discharge Goal: Progress toward achieving an optimal weight  will improve Outcome: Adequate for Discharge   Problem: Skin Integrity: Goal: Risk for impaired skin integrity will decrease Outcome: Adequate for Discharge   Problem: Tissue Perfusion: Goal: Adequacy of tissue perfusion will improve Outcome: Adequate for Discharge

## 2021-11-17 NOTE — Progress Notes (Signed)
PROGRESS NOTE    Connie Huang  NKN:[id number] DOB: [birth year] DOA: 11/14/2021 PCP: Elby Beck, DO    Brief Narrative:   Connie Huang is a 71 y.o. female with past medical history significant for type 1 diabetes mellitus, essential hypertension, hyperlipidemia, anxiety/depression who is admitted to [facility] long hospital for rectal prolapse s/p robotic resection of rectosigmoid colon by Dr. Johney Maine on 11/14/2021.  Hospitalist service consulted on 10/12 for assistance with diabetic management.  Patient at baseline utilizes an insulin pump.  Her last hemoglobin A1c 6.5 on 05/10/2021, well controlled.  Patient did receive postoperative IV Decadron.  Patient was slightly more confused this morning and has not been receiving basal insulin and currently being covered with sliding scale.  Patient now more alert and currently placing her insulin pump back on along with her Dexcom sensor.  Family present. Patient with no specific complaints at this time.  Denies headache, no dizziness, no chest pain, no palpitations, no shortness of breath, no fever/chills/night sweats, no nausea/vomiting/diarrhea, no abdominal pain, no focal weakness, no fatigue, no seizures.  [facility] consulted for assistance with diabetic management  Assessment & Plan:   Type 1 diabetes mellitus, with hyperglycemia Patient currently utilizes insulin pump, usually receives 13 units of basal insulin in a 24-hour period with correction insulin of 1 unit which usually drops her glucose by 50 points.  Patient has been off her insulin pump in the perioperative setting and did receive IV Decadron postoperatively which is likely confounding factor.  BMP was reassuring without anion gap acidosis. --Diabetic educator following, appreciate assistance --Continue insulin pump in accordance with hospital protocol --CBGs 5 times daily before meals, at bedtime and 3 AM  Anemia, postoperative Hemoglobin 9.0 yesterday, dropped to 6.6 on POD#1.   S/p 1 unit PRBC 10/13 --Hgb 9.0>6.8>7.0>8.0>8.1 --CBC daily   Essential hypertension Home regimen includes losartan 100 mg p.o. daily.  Currently on hold due to concerns of slight hypotension in the perioperative period.  Currently on Vasotec as needed.  Continue monitor BP closely.  On aspirin and statin.   Hyperlipidemia Continue atorvastatin 10 mg p.o. daily   Anxiety/depression Venlafaxine 75 mg p.o. daily   Rectal prolapse s/p robotic resection of rectosigmoid colon -- Per general surgery   DVT prophylaxis: enoxaparin (LOVENOX) injection 40 mg Start: 11/18/21 0800 SCD's Start: 11/14/21 1404    Code Status: Full Code Family Communication: No family present at bedside this morning  Disposition Plan:  Level of care: Med-Surg Status is: Inpatient Remains inpatient appropriate because: Receiving blood transfusion, further disposition per primary, general surgery    Procedures:  Robotic low anterior resection of rectosigmoid, Dr. Johney Maine 10/12  Antimicrobials:  Perioperative cefotetan   Subjective: Patient seen examined bedside, resting comfortably.  Lying in bed.  Drinking coffee.  Has not taken her insulin yet this morning due to nursing staff telling her to "hold".  Glucose up to 298 this morning.  Reports bowel movements.  No other complaints or concerns at this time.  JP drain removed by surgery yesterday.  Denies headache, no dizziness, no visual changes, no chest pain, no palpitations, no cough/congestion, no fever/chills/night sweats, no nausea/vomiting/diarrhea, no focal weakness, no fatigue, no paresthesias.  No other acute events overnight or concerns this morning per nursing staff.  Objective: Vitals:   11/16/21 2152 11/17/21 0500 11/17/21 0602 11/17/21 0830  BP: (!) 162/74  138/62 128/63  Pulse: (!) 102  93 87  Resp: '20  20 18  '$ Temp: 98.8 F (37.1 C)  98.8 F (37.1 C) 98 F (36.7 C)  TempSrc: Oral  Oral Oral  SpO2: 93%  92% 93%  Weight:  71.4 kg     Height:        Intake/Output Summary (Last 24 hours) at 11/17/2021 0958 Last data filed at 11/17/2021 0608 Gross per 24 hour  Intake 1700 ml  Output 620 ml  Net 1080 ml   Filed Weights   11/14/21 0640 11/17/21 0500  Weight: 69.4 kg 71.4 kg    Examination:  Physical Exam: GEN: NAD, alert and oriented x 3, wd/wn HEENT: NCAT, PERRL, EOMI, sclera clear, MMM PULM: CTAB w/o wheezes/crackles, normal respiratory effort, on room air CV: RRR w/o M/G/R GI: abd soft, nondistended, mild tenderness surrounding surgical incision site, honeycomb dressing noted, NABS, no R/G/M MSK: no peripheral edema, muscle strength globally intact 5/5 bilateral upper/lower extremities NEURO: CN II-XII intact, no focal deficits, sensation to light touch intact PSYCH: normal mood/affect Integumentary: Abdominal exam as above, otherwise no other concerning rashes/lesions/wounds noted on exposed skin surfaces    Data Reviewed: I have personally reviewed following labs and imaging studies  CBC: Recent Labs  Lab 11/15/21 0421 11/16/21 0405 11/16/21 0743 11/16/21 1846 11/17/21 0427  WBC 8.9  --  9.0  --   --   HGB 9.0* 6.8* 7.0* 8.0* 8.1*  HCT 27.0*  --  21.2* 24.6*  --   MCV 95.1  --  95.5  --   --   PLT 247  --  200  --   --    Basic Metabolic Panel: Recent Labs  Lab 11/15/21 0421 11/15/21 1708 11/15/21 1800 11/16/21 0405 11/17/21 0427  NA 135  --  128* 135  --   K 4.5  --  4.6 4.5 4.3  CL 101  --  97* 101  --   CO2 23  --  25 29  --   GLUCOSE 213* 488* 474* 113*  --   BUN 30*  --  40* 30*  --   CREATININE 0.96  --  1.17* 0.87 0.85  CALCIUM 8.8*  --  9.0 9.4  --   MG 1.6*  --   --   --   --    GFR: Estimated Creatinine Clearance: 58.4 mL/min (by C-G formula based on SCr of 0.85 mg/dL). Liver Function Tests: No results for input(s): "AST", "ALT", "ALKPHOS", "BILITOT", "PROT", "ALBUMIN" in the last 168 hours. No results for input(s): "LIPASE", "AMYLASE" in the last 168 hours. No  results for input(s): "AMMONIA" in the last 168 hours. Coagulation Profile: No results for input(s): "INR", "PROTIME" in the last 168 hours. Cardiac Enzymes: No results for input(s): "CKTOTAL", "CKMB", "CKMBINDEX", "TROPONINI" in the last 168 hours. BNP (last 3 results) No results for input(s): "PROBNP" in the last 8760 hours. HbA1C: No results for input(s): "HGBA1C" in the last 72 hours. CBG: Recent Labs  Lab 11/16/21 1555 11/16/21 1701 11/16/21 2150 11/17/21 0153 11/17/21 0736  GLUCAP 261* 243* 410* 322* 298*   Lipid Profile: No results for input(s): "CHOL", "HDL", "LDLCALC", "TRIG", "CHOLHDL", "LDLDIRECT" in the last 72 hours. Thyroid Function Tests: No results for input(s): "TSH", "T4TOTAL", "FREET4", "T3FREE", "THYROIDAB" in the last 72 hours. Anemia Panel: Recent Labs    11/16/21 0740  VITAMINB12 2,394*  FOLATE 33.2  FERRITIN 123  TIBC 211*  IRON 174*  RETICCTPCT 2.3   Sepsis Labs: No results for input(s): "PROCALCITON", "LATICACIDVEN" in the last 168 hours.  No results found for this or any previous visit (  from the past 240 hour(s)).       Radiology Studies: No results found.      Scheduled Meds:  acetaminophen  1,000 mg Oral Q6H   vitamin C  500 mg Oral BID   aspirin EC  81 mg Oral Daily   atorvastatin  10 mg Oral q1800   calcium-vitamin D  1 tablet Oral BID WC   [START ON 11/18/2021] enoxaparin (LOVENOX) injection  40 mg Subcutaneous Q24H   feeding supplement  237 mL Oral BID BM   ferrous sulfate  325 mg Oral BID WC   insulin aspart  0-15 Units Subcutaneous TID WC   insulin aspart  0-5 Units Subcutaneous QHS   insulin pump   Subcutaneous TID WC, HS, 0200   lip balm   Topical BID   loratadine  10 mg Oral Daily   multivitamin with minerals  1 tablet Oral Daily   polycarbophil  625 mg Oral BID   sodium chloride flush  3 mL Intravenous Q12H   venlafaxine XR  75 mg Oral Q breakfast   zinc sulfate  220 mg Oral Daily   Continuous Infusions:   sodium chloride     methocarbamol (ROBAXIN) IV       LOS: 3 days    Time spent: 49 minutes spent on chart review, discussion with nursing staff, consultants, updating family and interview/physical exam; more than 50% of that time was spent in counseling and/or coordination of care.    Anyely Cunning J [place] DO [facility] Available via Epic secure chat 7am-7pm After these hours, please refer to coverage provider listed on [email or web address] 11/17/2021, 9:58 AM

## 2021-11-17 NOTE — Progress Notes (Signed)
Patient discharged to home  via w/c accompanied by son. Discharge instruction give and explained and verbalized understanding. New home pain medication,tramadol also received from pharmacy and recived by patient. No c/o pain voiced, no s/s of any acute distress noted at the time of discharge. All personal belongings taken by pt.

## 2021-11-17 NOTE — Progress Notes (Signed)
3 Days Post-Op   Subjective/Chief Complaint: In general she is doing well.  She slept well last night, denies pain, she is tolerating p.o. without nausea, and she is having bowel movements.  She is on some nasal cannula oxygen this morning for some reason.   Objective: Vital signs in last 24 hours: Temp:  [98 F (36.7 C)-98.8 F (37.1 C)] 98 F (36.7 C) (10/14 0830) Pulse Rate:  [86-102] 87 (10/14 0830) Resp:  [18-20] 18 (10/14 0830) BP: (116-162)/(48-74) 128/63 (10/14 0830) SpO2:  [90 %-98 %] 93 % (10/14 0830) Weight:  [71.4 kg] 71.4 kg (10/14 0500) Last BM Date : 11/16/21  Intake/Output from previous day: 10/13 0701 - 10/14 0700 In: 1700 [P.O.:1380; Blood:320] Out: 630 [Urine:600; Drains:30] Intake/Output this shift: No intake/output data recorded.  Alert well-appearing Unlabored respirations Abdomen is soft, nontender, nondistended.  Incisions clean, dry intact and dressings from the OR remain in place.  Lab Results:  Recent Labs    11/15/21 0421 11/16/21 0405 11/16/21 0743 11/16/21 1846 11/17/21 0427  WBC 8.9  --  9.0  --   --   HGB 9.0*   < > 7.0* 8.0* 8.1*  HCT 27.0*  --  21.2* 24.6*  --   PLT 247  --  200  --   --    < > = values in this interval not displayed.   BMET Recent Labs    11/15/21 1800 11/16/21 0405 11/17/21 0427  NA 128* 135  --   K 4.6 4.5 4.3  CL 97* 101  --   CO2 25 29  --   GLUCOSE 474* 113*  --   BUN 40* 30*  --   CREATININE 1.17* 0.87 0.85  CALCIUM 9.0 9.4  --    PT/INR No results for input(s): "LABPROT", "INR" in the last 72 hours. ABG No results for input(s): "PHART", "HCO3" in the last 72 hours.  Invalid input(s): "PCO2", "PO2"  Studies/Results: No results found.  Anti-infectives: Anti-infectives (From admission, onward)    Start     Dose/Rate Route Frequency Ordered Stop   11/14/21 2100  cefoTEtan (CEFOTAN) 2 g in sodium chloride 0.9 % 100 mL IVPB        2 g 200 mL/hr over 30 Minutes Intravenous Every 12 hours  11/14/21 1403 11/14/21 2238   11/14/21 1400  neomycin (MYCIFRADIN) tablet 1,000 mg  Status:  Discontinued       See Hyperspace for full Linked Orders Report.   1,000 mg Oral 3 times per day 11/14/21 0639 11/14/21 1344   11/14/21 1400  metroNIDAZOLE (FLAGYL) tablet 1,000 mg  Status:  Discontinued       See Hyperspace for full Linked Orders Report.   1,000 mg Oral 3 times per day 11/14/21 1751 11/14/21 1344   11/14/21 0645  cefoTEtan (CEFOTAN) 2 g in sodium chloride 0.9 % 100 mL IVPB        2 g 200 mL/hr over 30 Minutes Intravenous On call to O.R. 11/14/21 0258 11/14/21 0920       Assessment/Plan: s/p Procedure(s): ROBOTIC LOW ANTERIOR RESECTION OF RECTOSIGMOID WITH BILATERAL TAP BLOCK (N/A) RECTOPEXY (N/A) RIGID PROCTOSCOPY (N/A) Wean off oxygen continue mobilizing as tolerated Hemoglobin stable last 24 hours Blood sugars have remained elevated, will change diet to carb modified Anticipate discharge in the next 24 hours, possibly even this evening if she is ambulating and off oxygen.   Angles Trevizo A Kae Heller 11/17/2021

## 2021-11-18 NOTE — Discharge Summary (Signed)
Physician Discharge Summary  Patient ID: Connie Huang MRN: [id number] DOB/AGE: [birth year] 73 y.o.  Admit date: 11/14/2021 Discharge date: 11/18/2021  Admission Diagnoses: rectal prolapse s/p resection/rectopexy  Discharge Diagnoses:  Principal Problem:   Complete rectal prolapse Active Problems:   Scoliosis of lumbosacral spine   Long term (current) use of insulin (HCC)   Adjustment disorder   Allergic rhinitis   At risk for falls   Cervical radiculopathy   Chronic constipation   Mixed hyperlipidemia   Benign essential hypertension   Hypertensive heart disease without congestive heart failure   Insulin pump in place   Polyneuropathy due to type 1 diabetes mellitus (HCC)   Rheumatoid arthritis (HCC)   SUI (stress urinary incontinence, female)     Disposition: Discharge disposition: 01-Home or Self Care       Discharge Instructions     Call MD for:   Complete by: As directed    FEVER > 101.5 F  (temperatures < 101.5 F are not significant)   Call MD for:  extreme fatigue   Complete by: As directed    Call MD for:  persistant dizziness or light-headedness   Complete by: As directed    Call MD for:  persistant nausea and vomiting   Complete by: As directed    Call MD for:  redness, tenderness, or signs of infection (pain, swelling, redness, odor or green/yellow discharge around incision site)   Complete by: As directed    Call MD for:  severe uncontrolled pain   Complete by: As directed    Diet - low sodium heart healthy   Complete by: As directed    Start with a bland diet such as soups, liquids, starchy foods, low fat foods, etc. the first few days at home. Gradually advance to a solid, low-fat, high fiber diet by the end of the first week at home.   Add a fiber supplement to your diet (Metamucil, etc) If you feel full, bloated, or constipated, stay on a full liquid or pureed/blenderized diet for a few days until you feel better and are no longer  constipated.   Discharge instructions   Complete by: As directed    See Discharge Instructions If you are not getting better after two weeks or are noticing you are getting worse, contact our office [phone] for further advice.  We may need to adjust your medications, re-evaluate you in the office, send you to the emergency room, or see what other things we can do to help. The clinic staff is available to answer your questions during regular business hours (8:30am-5pm).  Please don't hesitate to call and ask to speak to one of our nurses for clinical concerns.    A surgeon from [facility] is always on call at the hospitals 24 hours/day If you have a medical emergency, go to the nearest emergency room or call 911.   Discharge wound care:   Complete by: As directed    It is good for closed incisions and even open wounds to be washed every day.  Shower every day.  Short baths are fine.  Wash the incisions and wounds clean with soap & water.    You may leave closed incisions open to air if it is dry.   You may cover the incision with clean gauze & replace it after your daily shower for comfort.  TEGADERM:  You have clear gauze band-aid dressings over your closed incision(s).  Remove the dressings 3 days after surgery.  Driving Restrictions   Complete by: As directed    You may drive when: - you are no longer taking narcotic prescription pain medication - you can comfortably wear a seatbelt - you can safely make sudden turns/stops without pain.   Increase activity slowly   Complete by: As directed    Start light daily activities --- self-care, walking, climbing stairs- beginning the day after surgery.  Gradually increase activities as tolerated.  Control your pain to be active.  Stop when you are tired.  Ideally, walk several times a day, eventually an hour a day.   Most people are back to most day-to-day activities in a few weeks.  It takes 4-6 weeks to get back to  unrestricted, intense activity. If you can walk 30 minutes without difficulty, it is safe to try more intense activity such as jogging, treadmill, bicycling, low-impact aerobics, swimming, etc. Save the most intensive and strenuous activity for last (Usually 4-8 weeks after surgery) such as sit-ups, heavy lifting, contact sports, etc.  Refrain from any intense heavy lifting or straining until you are off narcotics for pain control.  You will have off days, but things should improve week-by-week. DO NOT PUSH THROUGH PAIN.  Let pain be your guide: If it hurts to do something, don't do it.   Lifting restrictions   Complete by: As directed    If you can walk 30 minutes without difficulty, it is safe to try more intense activity such as jogging, treadmill, bicycling, low-impact aerobics, swimming, etc. Save the most intensive and strenuous activity for last (Usually 4-8 weeks after surgery) such as sit-ups, heavy lifting, contact sports, etc.   Refrain from any intense heavy lifting or straining until you are off narcotics for pain control.  You will have off days, but things should improve week-by-week. DO NOT PUSH THROUGH PAIN.  Let pain be your guide: If it hurts to do something, don't do it.  Pain is your body warning you to avoid that activity for another week until the pain goes down.   May shower / Bathe   Complete by: As directed    May walk up steps   Complete by: As directed    Remove dressing in 72 hours   Complete by: As directed    Make sure all dressings are removed by the third day after surgery.  Leave incisions open to air.  OK to cover incisions with gauze or bandages as desired   Sexual Activity Restrictions   Complete by: As directed    You may have sexual intercourse when it is comfortable. If it hurts to do something, stop.      Allergies as of 11/17/2021       Reactions   Zonalon [doxepin Hcl] Rash   Erythromycin Diarrhea, Nausea And Vomiting, Other (See Comments)    Other reaction(s): Stomach pain        Medication List     TAKE these medications    Accu-Chek FastClix Lancets Misc Accu-Chek Fastclix Lancet Drum  USE AS DIRECTED   aspirin 81 MG tablet Take 81 mg by mouth daily.   atorvastatin 10 MG tablet Commonly known as: LIPITOR Take 10 mg by mouth daily.   BENEFIBER DRINK MIX PO Take 1 packet by mouth daily.   bisacodyl 5 MG EC tablet Generic drug: bisacodyl Take 20 mg by mouth daily as needed for moderate constipation.   Calcium Carb-Cholecalciferol 600-800 MG-UNIT Tabs Take 1 tablet by mouth 2 (two) times daily.  cetirizine 10 MG tablet Commonly known as: ZYRTEC Take 10 mg by mouth daily.   Dexcom G6 Receiver Devi Dexcom G6 Receiver misc  USE AS DIRECTED   Dexcom G6 Sensor Misc USE 1 SENSOR EVERY 10 DAYS TO MONITOR BLOOD GLUCOSE FOUR TIMES DAILY   Dexcom G6 Sensor Misc USE 1 SENSOR EVERY 10 DAYS TO MONITOR BLOOD GLUCOSE FOUR TIMES DAILY   Dexcom G6 Transmitter Misc Use 1 transmitter every 90 days.   insulin aspart 100 UNIT/ML injection Commonly known as: novoLOG Use with pump for TDD around 60 units daily. Icd10 code e10.9   losartan 100 MG tablet Commonly known as: COZAAR Take 100 mg by mouth daily.   magnesium 84 MG (7MEQ) Tbcr SR tablet Commonly known as: MAGTAB Take 84 mg by mouth daily.   multivitamin with minerals Tabs tablet Take 1 tablet by mouth daily.   SM OMEGA-3-6-9 FATTY ACIDS PO Take 1,600 mg by mouth 2 (two) times daily.   SUPER B COMPLEX PO Take 1 capsule by mouth daily.   traMADol 50 MG tablet Commonly known as: ULTRAM Take 1-2 tablets (50-100 mg total) by mouth every 6 (six) hours as needed for moderate pain or severe pain.   triamcinolone cream 0.1 % Commonly known as: KENALOG Apply 1 Application topically daily as needed (dermatitis).   venlafaxine XR 75 MG 24 hr capsule Commonly known as: EFFEXOR-XR Take 75 mg by mouth daily with breakfast.   vitamin C 1000 MG  tablet Take 1,000 mg by mouth daily.   Vitamin D3 50 MCG (2000 UT) Tabs Take 2,000 Units by mouth daily.   zinc sulfate 50 MG Caps capsule Take 50 mg by mouth daily.               Discharge Care Instructions  (From admission, onward)           Start     Ordered   11/14/21 0000  Discharge wound care:       Comments: It is good for closed incisions and even open wounds to be washed every day.  Shower every day.  Short baths are fine.  Wash the incisions and wounds clean with soap & water.    You may leave closed incisions open to air if it is dry.   You may cover the incision with clean gauze & replace it after your daily shower for comfort.  TEGADERM:  You have clear gauze band-aid dressings over your closed incision(s).  Remove the dressings 3 days after surgery.   11/14/21 0826            Follow-up Information     Michael Boston, MD Follow up in 3 week(s).   Specialties: General Surgery, Colon and Rectal Surgery Why: To follow up after your operation Contact information: [place] [address] [phone]                 Signed: Clovis Riley 11/18/2021, 10:18 AM

## 2021-11-20 ENCOUNTER — Telehealth: Payer: Self-pay | Admitting: Nurse Practitioner

## 2021-11-20 NOTE — Telephone Encounter (Signed)
Rachel - Clinical Nutritionist at [facility] in [place] would like you to call her as soon as you can. [phone] ext [phone]

## 2021-11-20 NOTE — Telephone Encounter (Signed)
Spoke with her about possibly upgrading patient to newer pump/CGM combo.  She will be emailing me the specifics.

## 2021-11-21 ENCOUNTER — Encounter: Payer: Self-pay | Admitting: Nurse Practitioner

## 2021-11-27 ENCOUNTER — Other Ambulatory Visit: Payer: Self-pay | Admitting: Nurse Practitioner

## 2021-11-27 DIAGNOSIS — E109 Type 1 diabetes mellitus without complications: Secondary | ICD-10-CM

## 2021-12-28 ENCOUNTER — Other Ambulatory Visit (HOSPITAL_COMMUNITY): Payer: Self-pay

## 2022-01-06 ENCOUNTER — Other Ambulatory Visit: Payer: Self-pay | Admitting: Nurse Practitioner

## 2022-01-06 DIAGNOSIS — E109 Type 1 diabetes mellitus without complications: Secondary | ICD-10-CM

## 2022-01-15 ENCOUNTER — Other Ambulatory Visit (HOSPITAL_COMMUNITY): Payer: Self-pay

## 2022-01-15 ENCOUNTER — Telehealth: Payer: Self-pay

## 2022-01-15 NOTE — Telephone Encounter (Signed)
Noted  

## 2022-01-15 NOTE — Telephone Encounter (Signed)
Patient Advocate Encounter  Received a fax from [facility] for a PA for Dexcom G6 transmitter. This is not covered under part D, it is covered under part B. Contacted pharmacy and they will process under part B. Nothing further at this time.

## 2022-01-21 ENCOUNTER — Ambulatory Visit (INDEPENDENT_AMBULATORY_CARE_PROVIDER_SITE_OTHER): Payer: Medicare Other | Admitting: Nurse Practitioner

## 2022-01-21 ENCOUNTER — Encounter: Payer: Self-pay | Admitting: Nurse Practitioner

## 2022-01-21 VITALS — BP 118/69 | HR 76

## 2022-01-21 DIAGNOSIS — E109 Type 1 diabetes mellitus without complications: Secondary | ICD-10-CM | POA: Diagnosis not present

## 2022-01-21 DIAGNOSIS — E782 Mixed hyperlipidemia: Secondary | ICD-10-CM | POA: Diagnosis not present

## 2022-01-21 DIAGNOSIS — I10 Essential (primary) hypertension: Secondary | ICD-10-CM | POA: Diagnosis not present

## 2022-01-21 LAB — POCT UA - MICROALBUMIN: Microalbumin Ur, POC: 10 mg/L

## 2022-01-21 LAB — POCT GLYCOSYLATED HEMOGLOBIN (HGB A1C): Hemoglobin A1C: 5.7 % — AB (ref 4.0–5.6)

## 2022-01-21 NOTE — Progress Notes (Signed)
Endocrinology Follow Up Note       01/21/2022, 2:03 PM   Subjective:    Patient ID: Connie Huang, female    DOB: 1950/05/02.  Connie Huang is being seen in follow up after being seen in consultation for management of currently uncontrolled symptomatic diabetes requested by Elby Beck, DO.   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   Hyperlipidemia    Hypertension    Hyponatremia 05/2015   Lumbar stenosis    Neuromuscular disorder ([place])    Pneumonia 05/2015   PONV (postoperative nausea and vomiting)     Past Surgical History:  Procedure Laterality Date   ANTERIOR LAT LUMBAR FUSION Left 02/15/2016   Procedure: LEFT LUMBAR THREE-FOUR ANTEROLATERAL LUMBAR INTERBODY FUSION WITH LATERAL PLATE;  Surgeon: Erline Levine, MD;  Location: [facility];  Service: Neurosurgery;  Laterality: Left;   BACK SURGERY     Lumbar 4 and 5 Fusion   CATARACT EXTRACTION Bilateral 2023   [place], [place]   COLONOSCOPY  2015   last colonoscopy was done in [place]   EYE SURGERY     PROCTOSCOPY N/A 11/14/2021   Procedure: RIGID PROCTOSCOPY;  Surgeon: Michael Boston, MD;  Location: [facility];  Service: General;  Laterality: N/A;   RECTOPEXY N/A 11/14/2021   Procedure: RECTOPEXY;  Surgeon: Michael Boston, MD;  Location: [facility];  Service: General;  Laterality: N/A;   TONSILLECTOMY      Social History   Socioeconomic History   Marital status: Widowed    Spouse name: Not on  file   Number of children: Not on file   Years of education: Not on file   Highest education level: Not on file  Occupational History   Occupation: retired  Tobacco Use   Smoking status: Former    Years: 20.00    Types: Cigarettes    Quit date: 10/15/1997    Years since quitting: 24.2   Smokeless tobacco: Never  Vaping Use   Vaping Use: Never used  Substance and Sexual Activity   Alcohol use: No   Drug use: No   Sexual activity: Not Currently  Other Topics Concern   Not on file  Social History Narrative   Not on file   Social Determinants of Health   Financial Resource Strain: Not on file  Food Insecurity: No Food Insecurity (11/14/2021)   Hunger Vital Sign    Worried About Running Out of Food in the Last Year: Never true    Ran Out of Food in the Last Year: Never true  Transportation Needs: No Transportation Needs (11/14/2021)   [place] - Transportation  Lack of Transportation (Medical): No    Lack of Transportation (Non-Medical): No  Physical Activity: Not on file  Stress: Not on file  Social Connections: Not on file    Family History  Problem Relation Age of Onset   Hypertension Mother    Hyperlipidemia Mother    Dementia Mother    Heart disease Father    Hyperlipidemia Father    Hypertension Father    Stroke Father    Heart attack Father    Colon cancer Neg Hx    Esophageal cancer Neg Hx    Rectal cancer Neg Hx    Stomach cancer Neg Hx     Outpatient Encounter Medications as of 01/21/2022  Medication Sig   Accu-Chek FastClix Lancets MISC Accu-Chek Fastclix Lancet Drum  USE AS DIRECTED   Ascorbic Acid (VITAMIN C) 1000 MG tablet Take 1,000 mg by mouth daily.   aspirin 81 MG tablet Take 81 mg by mouth daily.   atorvastatin (LIPITOR) 10 MG tablet Take 10 mg by mouth daily.   Calcium Carb-Cholecalciferol 600-800 MG-UNIT TABS Take 1 tablet by mouth 2 (two) times daily.   cetirizine (ZYRTEC) 10 MG tablet Take 10 mg by mouth daily.   Continuous Blood Gluc  Receiver (DEXCOM G6 RECEIVER) DEVI Dexcom G6 Receiver misc  USE AS DIRECTED   Continuous Blood Gluc Sensor (DEXCOM G6 SENSOR) MISC USE 1 SENSOR EVERY 10 DAYS TO MONITOR BLOOD SUGAR FOUR TIMES DAILY   Continuous Blood Gluc Transmit (DEXCOM G6 TRANSMITTER) MISC USE 1 TRANSMITTER EVERY 90 DAYS   insulin aspart (NOVOLOG) 100 UNIT/ML injection Use with pump for TDD around 60 units daily. Icd10 code e10.9   losartan (COZAAR) 100 MG tablet Take 100 mg by mouth daily.   Multiple Vitamin (MULTIVITAMIN WITH MINERALS) TABS tablet Take 1 tablet by mouth daily.   SM OMEGA-3-6-9 FATTY ACIDS PO Take 1,600 mg by mouth 2 (two) times daily.   triamcinolone cream (KENALOG) 0.1 % Apply 1 Application topically daily as needed (dermatitis).   venlafaxine XR (EFFEXOR-XR) 75 MG 24 hr capsule Take 75 mg by mouth daily with breakfast.   zinc sulfate 50 MG CAPS capsule Take 50 mg by mouth daily.   B Complex-C (SUPER B COMPLEX PO) Take 1 capsule by mouth daily.   bisacodyl 5 MG EC tablet Take 20 mg by mouth daily as needed for moderate constipation.   Cholecalciferol (VITAMIN D3) 50 MCG (2000 UT) TABS Take 2,000 Units by mouth daily.   Continuous Blood Gluc Sensor (DEXCOM G6 SENSOR) MISC USE 1 SENSOR EVERY 10 DAYS TO MONITOR BLOOD GLUCOSE FOUR TIMES DAILY   magnesium (MAGTAB) 84 MG (7MEQ) TBCR SR tablet Take 84 mg by mouth daily.   traMADol (ULTRAM) 50 MG tablet Take 1-2 tablets (50-100 mg total) by mouth every 6 (six) hours as needed for moderate pain or severe pain.   Wheat Dextrin (BENEFIBER DRINK MIX PO) Take 1 packet by mouth daily.   No facility-administered encounter medications on file as of 01/21/2022.    ALLERGIES: Allergies  Allergen Reactions   Zonalon [Doxepin Hcl] Rash   Erythromycin Diarrhea, Nausea And Vomiting and Other (See Comments)    Other reaction(s): Stomach pain    VACCINATION STATUS: Immunization History  Administered Date(s) Administered   Moderna Sars-Covid-2 Vaccination 04/03/2019,  05/01/2019, 12/09/2019, 06/14/2020    Diabetes She presents for her follow-up diabetic visit. She has type 1 diabetes mellitus. Onset time: Diagnosed at approx age of 73. Her disease course has been stable. Hypoglycemia symptoms  include nervousness/anxiousness, sweats and tremors. Hypoglycemia complications include nocturnal hypoglycemia. (About 1 year ago (May 2021), she had hypoglycemic event where she was pulling into a parking space and she blacked out and required attention at the ED.) Symptoms are stable. Diabetic complications include nephropathy, peripheral neuropathy, PVD and retinopathy. Risk factors for coronary artery disease include diabetes mellitus, hypertension, dyslipidemia, post-menopausal and sedentary lifestyle. Current diabetic treatment includes insulin pump. She is compliant with treatment all of the time. Her weight is fluctuating minimally. She is following a generally healthy diet. Meal planning includes avoidance of concentrated sweets and ADA exchanges. She has not had a previous visit with a dietitian. She participates in exercise intermittently. Her home blood glucose trend is decreasing steadily. Her overall blood glucose range is 110-130 mg/dl. (She presents today with her CGM and pump showing tight fasting and at goal postprandial glycemic profile.  Her POCT A1c today is 5.7%, improving from last visit of 6.9%.  Analysis of her CGM shows TIR 80%, TAR 16%, TBR 4% with a GMI of 6.4%.  She has frequent nocturnal hypoglycemia, has been having to carb load with Glucerna before bed to prevent it.  She has been hospitalized for rectal prolapse surgery since last visit.) An ACE inhibitor/angiotensin II receptor blocker is being taken. She does not see a podiatrist.Eye exam is current.  Hypertension This is a chronic problem. The current episode started more than 1 year ago. The problem has been resolved since onset. The problem is controlled. Associated symptoms include sweats. There  are no associated agents to hypertension. Risk factors for coronary artery disease include diabetes mellitus, dyslipidemia, family history and post-menopausal state. Past treatments include angiotensin blockers. The current treatment provides significant improvement. There are no compliance problems.  Hypertensive end-organ damage includes kidney disease, PVD and retinopathy. Identifiable causes of hypertension include chronic renal disease.  Hyperlipidemia This is a chronic problem. The current episode started more than 1 year ago. The problem is controlled. Recent lipid tests were reviewed and are normal. Exacerbating diseases include chronic renal disease and diabetes. There are no known factors aggravating her hyperlipidemia. Current antihyperlipidemic treatment includes statins. The current treatment provides moderate improvement of lipids. There are no compliance problems.  Risk factors for coronary artery disease include diabetes mellitus, dyslipidemia, family history, hypertension and post-menopausal.    Review of systems  Constitutional: + Minimally fluctuating body weight,  current There is no height or weight on file to calculate BMI. , no fatigue, no subjective hyperthermia, no subjective hypothermia Eyes: no blurry vision, no xerophthalmia ENT: no sore throat, no nodules palpated in throat, no dysphagia/odynophagia, no hoarseness Cardiovascular: no chest pain, no shortness of breath, no palpitations, no leg swelling Respiratory: no cough, no shortness of breath Gastrointestinal: no nausea/vomiting/diarrhea Musculoskeletal: no muscle/joint aches Skin: no rashes, no hyperemia Neurological: no tremors, no numbness, no tingling, no dizziness Psychiatric: no depression, no anxiety  Objective:     BP 118/69 (BP Location: Left Arm, Patient Position: Sitting, Cuff Size: Normal)   Pulse 76   Wt Readings from Last 3 Encounters:  11/17/21 157 lb 4.8 oz (71.4 kg)  11/09/21 153 lb (69.4 kg)   10/01/21 155 lb 12.8 oz (70.7 kg)     BP Readings from Last 3 Encounters:  01/21/22 118/69  11/17/21 (!) 164/79  11/09/21 132/64      Physical Exam- Limited  Constitutional:  There is no height or weight on file to calculate BMI. , not in acute distress, normal state of mind Eyes:  EOMI, no exophthalmos Musculoskeletal: no gross deformities, strength intact in all four extremities, no gross restriction of joint movements Skin:  no rashes, no hyperemia Neurological: no tremor with outstretched hands   Diabetic Foot Exam - Simple   Simple Foot Form Diabetic Foot exam was performed with the following findings: Yes 01/21/2022  1:39 PM  Visual Inspection No deformities, no ulcerations, no other skin breakdown bilaterally: Yes Sensation Testing Intact to touch and monofilament testing bilaterally: Yes Pulse Check Posterior Tibialis and Dorsalis pulse intact bilaterally: Yes Comments     CMP ( most recent) CMP     Component Value Date/Time   NA 135 11/16/2021 0405   NA 140 05/10/2021 0000   K 4.3 11/17/2021 0427   CL 101 11/16/2021 0405   CO2 29 11/16/2021 0405   GLUCOSE 113 (H) 11/16/2021 0405   BUN 30 (H) 11/16/2021 0405   BUN 19 05/10/2021 0000   CREATININE 0.85 11/17/2021 0427   CALCIUM 9.4 11/16/2021 0405   ALBUMIN 4.2 05/10/2021 0000   AST 30 05/10/2021 0000   ALT 24 05/10/2021 0000   ALKPHOS 79 05/10/2021 0000   GFRNONAA >60 11/17/2021 0427   GFRAA >60 02/13/2016 1419     Diabetic Labs (most recent): Lab Results  Component Value Date   HGBA1C 5.7 (A) 01/21/2022   HGBA1C 6.9 09/18/2021   HGBA1C 6.5 05/10/2021   MICROALBUR 10 mg/L 01/21/2022   MICROALBUR 10 07/06/2020     Lipid Panel ( most recent) Lipid Panel     Component Value Date/Time   CHOL 147 05/10/2021 0000   TRIG 87 05/10/2021 0000   HDL 58 05/10/2021 0000   LDLCALC 74 05/10/2021 0000      No results found for: "TSH", "FREET4"         Assessment & Plan:   1) Type 1 diabetes  mellitus without complication ([place])  She presents today with her CGM and pump showing tight fasting and at goal postprandial glycemic profile.  Her POCT A1c today is 5.7%, improving from last visit of 6.9%.  Analysis of her CGM shows TIR 80%, TAR 16%, TBR 4% with a GMI of 6.4%.  She has frequent nocturnal hypoglycemia, has been having to carb load with Glucerna before bed to prevent it.  She has been hospitalized for rectal prolapse surgery since last visit.  Connie Huang has currently uncontrolled symptomatic type 2 DM since 71 years of age.  -Recent labs reviewed.  - I had a long discussion with her about the progressive nature of diabetes and the pathology behind its complications. -her diabetes is complicated by retinopathy, PVD, mild CKD and she remains at a high risk for more acute and chronic complications which include CAD, CVA, CKD, retinopathy, and neuropathy. These are all discussed in detail with her.  - Nutritional counseling repeated at each appointment due to patients tendency to fall back in to old habits.  - The patient admits there is a room for improvement in their diet and drink choices. -  Suggestion is made for the patient to avoid simple carbohydrates from their diet including Cakes, Sweet Desserts / Pastries, Ice Cream, Soda (diet and regular), Sweet Tea, Candies, Chips, Cookies, Sweet Pastries, Store Bought Juices, Alcohol in Excess of 1-2 drinks a day, Artificial Sweeteners, Coffee Creamer, and "Sugar-free" Products. This will help patient to have stable blood glucose profile and potentially avoid unintended weight gain.   - I encouraged the patient to switch to unprocessed or minimally processed complex starch  and increased protein intake (animal or plant source), fruits, and vegetables.   - Patient is advised to stick to a routine mealtimes to eat 3 meals a day and avoid unnecessary snacks (to snack only to correct hypoglycemia).  - I have approached her with  the following individualized plan to manage her diabetes and patient agrees:   -For safety purposes, avoiding hypoglycemia is the number 1 priority in her case.  I discussed with her that an acceptable target A1c for her would be between 7-7.5%.  -I did make several changes to her pump settings today including lowering her basal rate to 0.5 units/ hr 12a-12a and target BG of 110-150.  Settings that stayed the same are active run time of 4 hrs, insulin sensitivity factor of 50, insulin carb ratio to 1:8 to help control her postprandial hyperglycemia.  -she is encouraged to continue monitoring glucose 4 times daily (using her CGM or back up meter), before meals and before bed, and to call the clinic if she has readings less than 70 or above 200 for 3 tests in a row.  - she is warned not to take insulin without proper monitoring per orders. - Adjustment parameters are given to her for hypo and hyperglycemia in writing.  -Given her type 1 diabetes diagnosis, insulin is the only choice for treatment of her diabetes.  - Specific targets for  A1c; LDL, HDL, and Triglycerides were discussed with the patient.  2) Blood Pressure /Hypertension:  her blood pressure is controlled to target.   she is advised to continue her current medications including Losartan 100 mg p.o. daily with breakfast.  3) Lipids/Hyperlipidemia:    Review of her recent lipid panel from 05/10/21 showed controlled LDL at 74 .  she is advised to continue Lipitor 10 mg daily at bedtime.  Side effects and precautions discussed with her.  4)  Weight/Diet:  her There is no height or weight on file to calculate BMI.  -   she is not a candidate for weight loss. Exercise, and detailed carbohydrates information provided  -  detailed on discharge instructions.  5) Chronic Care/Health Maintenance: -she is on ACEI/ARB and Statin medications and is encouraged to initiate and continue to follow up with Ophthalmology, Dentist, Podiatrist at least  yearly or according to recommendations, and advised to stay away from smoking. I have recommended yearly flu vaccine and pneumonia vaccine at least every 5 years; moderate intensity exercise for up to 150 minutes weekly; and sleep for at least 7 hours a day.  - she is advised to maintain close follow up with Elby Beck, DO for primary care needs, as well as her other providers for optimal and coordinated care.      I spent 44 minutes in the care of the patient today including review of labs from [place], Lipids, Thyroid Function, Hematology (current and previous including abstractions from other facilities); face-to-face time discussing  her blood glucose readings/logs, discussing hypoglycemia and hyperglycemia episodes and symptoms, medications doses, her options of short and long term treatment based on the latest standards of care / guidelines;  discussion about incorporating lifestyle medicine;  and documenting the encounter. Risk reduction counseling performed per USPSTF guidelines to reduce obesity and cardiovascular risk factors.     Please refer to Patient Instructions for Blood Glucose Monitoring and Insulin/Medications Dosing Guide"  in media tab for additional information. Please  also refer to " Patient Self Inventory" in the Media  tab for reviewed elements of pertinent  patient history.  Connie Huang participated in the discussions, expressed understanding, and voiced agreement with the above plans.  All questions were answered to her satisfaction. she is encouraged to contact clinic should she have any questions or concerns prior to her return visit.   Follow up plan: - Return in about 4 months (around 05/23/2022) for Diabetes F/U with A1c in office, No previsit labs, Bring meter and logs.    Rayetta Pigg, [facility] [address] [place], [place] [address] Phone: [phone] Fax: [phone]  01/21/2022, 2:03 PM

## 2022-04-11 ENCOUNTER — Telehealth: Payer: Self-pay | Admitting: Nurse Practitioner

## 2022-04-11 NOTE — Telephone Encounter (Signed)
Vesta Mixer!  It could be related to a failed pump site.  I recommend she change sites and see if her pump will bring it back down.  I just pulled her report and it does look better as of now.

## 2022-04-11 NOTE — Telephone Encounter (Signed)
Pt states that she had high readings yesterday after she had a bone density at 2:00. Pt states as high as 533. She states he hasn't taken anything new and has not eaten anything she would normally eat. Her readings are sharable with Dexcom. Please advise.

## 2022-04-11 NOTE — Telephone Encounter (Signed)
Pt notified and agrees. She will call back if BG is out of her normal range after changing the site.

## 2022-05-10 ENCOUNTER — Other Ambulatory Visit: Payer: Self-pay | Admitting: Nurse Practitioner

## 2022-05-10 DIAGNOSIS — E109 Type 1 diabetes mellitus without complications: Secondary | ICD-10-CM

## 2022-05-13 ENCOUNTER — Telehealth: Payer: Self-pay | Admitting: *Deleted

## 2022-05-14 ENCOUNTER — Other Ambulatory Visit: Payer: Self-pay

## 2022-05-14 DIAGNOSIS — E109 Type 1 diabetes mellitus without complications: Secondary | ICD-10-CM

## 2022-05-14 MED ORDER — DEXCOM G6 SENSOR MISC: 0 refills | Status: DC

## 2022-05-14 NOTE — Telephone Encounter (Signed)
Patient was called and made aware of what the pharmacy stated.

## 2022-05-22 ENCOUNTER — Telehealth: Payer: Self-pay | Admitting: *Deleted

## 2022-05-22 ENCOUNTER — Other Ambulatory Visit: Payer: Self-pay | Admitting: Nurse Practitioner

## 2022-05-22 MED ORDER — INSULIN ASPART 100 UNIT/ML IJ SOLN: INTRAMUSCULAR | 3 refills | Status: DC

## 2022-05-22 NOTE — Telephone Encounter (Signed)
Patient has called a second time, about the PA for her Insulin. She is about out and the pharmacy will not refill until that get the PA from patient's medicare.  Patient was called and made aware that the RX PA team may be working on it and that we would follow up to get a status.

## 2022-05-23 ENCOUNTER — Other Ambulatory Visit (HOSPITAL_COMMUNITY): Payer: Self-pay

## 2022-05-23 ENCOUNTER — Telehealth: Payer: Self-pay | Admitting: *Deleted

## 2022-05-23 NOTE — Telephone Encounter (Signed)
Patient was called and made aware, she will call [facility] to follow up.

## 2022-05-23 NOTE — Telephone Encounter (Signed)
I have talked with the patient this morning, again. Advised her that I would try again to reach out to the PA team for a status. Also, talk with [facility] , her pharmacy, to see what I can find out.  I have talked with the [facility] in [place], [place]. They states that they have called and faxed this PA request several times, last being this morning. The patient is Medicare part A/B, they ask that we be aware of this. Patient is on a insulin pump. She needs this now. May I ask that the PA team respond to this as soon as possible?

## 2022-05-27 ENCOUNTER — Ambulatory Visit (INDEPENDENT_AMBULATORY_CARE_PROVIDER_SITE_OTHER): Payer: Medicare Other | Admitting: Nurse Practitioner

## 2022-05-27 ENCOUNTER — Encounter: Payer: Self-pay | Admitting: Nurse Practitioner

## 2022-05-27 VITALS — BP 130/64 | HR 77 | Ht 65.0 in | Wt 150.6 lb

## 2022-05-27 DIAGNOSIS — I10 Essential (primary) hypertension: Secondary | ICD-10-CM | POA: Diagnosis not present

## 2022-05-27 DIAGNOSIS — E782 Mixed hyperlipidemia: Secondary | ICD-10-CM | POA: Diagnosis not present

## 2022-05-27 DIAGNOSIS — E109 Type 1 diabetes mellitus without complications: Secondary | ICD-10-CM | POA: Diagnosis not present

## 2022-05-27 MED ORDER — DEXCOM G7 SENSOR MISC: 1.0000 | 3 refills | Status: DC

## 2022-05-27 NOTE — Progress Notes (Signed)
Endocrinology Follow Up Note       05/27/2022, 2:31 PM   Subjective:    Patient ID: Connie Huang, female    DOB: April 11, 1950.  Connie Huang is being seen in follow up after being seen in consultation for management of currently uncontrolled symptomatic diabetes requested by [name], DO.   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   Hyperlipidemia    Hypertension    Hyponatremia 05/2015   Lumbar stenosis    Neuromuscular disorder    Pneumonia 05/2015   PONV (postoperative nausea and vomiting)     Past Surgical History:  Procedure Laterality Date   ANTERIOR LAT LUMBAR FUSION Left 02/15/2016   Procedure: LEFT LUMBAR THREE-FOUR ANTEROLATERAL LUMBAR INTERBODY FUSION WITH LATERAL PLATE;  Surgeon: Maeola Harman, MD;  Location: [facility];  Service: Neurosurgery;  Laterality: Left;   BACK SURGERY     Lumbar 4 and 5 Fusion   CATARACT EXTRACTION Bilateral 2023   [place], [place]   COLONOSCOPY  2015   last colonoscopy was done in [place]   EYE SURGERY     PROCTOSCOPY N/A 11/14/2021   Procedure: RIGID PROCTOSCOPY;  Surgeon: Karie Soda, MD;  Location: [facility];  Service: General;  Laterality: N/A;   RECTOPEXY N/A 11/14/2021   Procedure: RECTOPEXY;  Surgeon: Karie Soda, MD;  Location: [facility];  Service: General;  Laterality: N/A;   TONSILLECTOMY      Social History   Socioeconomic History   Marital status: Widowed    Spouse name: Not on file   Number of children:  Not on file   Years of education: Not on file   Highest education level: Not on file  Occupational History   Occupation: retired  Tobacco Use   Smoking status: Former    Years: 20    Types: Cigarettes    Quit date: 10/15/1997    Years since quitting: 24.6   Smokeless tobacco: Never  Vaping Use   Vaping Use: Never used  Substance and Sexual Activity   Alcohol use: No   Drug use: No   Sexual activity: Not Currently  Other Topics Concern   Not on file  Social History Narrative   Not on file   Social Determinants of Health   Financial Resource Strain: Not on file  Food Insecurity: No Food Insecurity (11/14/2021)   Hunger Vital Sign    Worried About Running Out of Food in the Last Year: Never true    Ran Out of Food in the Last Year: Never true  Transportation Needs: No Transportation Needs (11/14/2021)   PRAPARE - Administrator, Civil Service (Medical):  No    Lack of Transportation (Non-Medical): No  Physical Activity: Not on file  Stress: Not on file  Social Connections: Not on file    Family History  Problem Relation Age of Onset   Hypertension Mother    Hyperlipidemia Mother    Dementia Mother    Heart disease Father    Hyperlipidemia Father    Hypertension Father    Stroke Father    Heart attack Father    Colon cancer Neg Hx    Esophageal cancer Neg Hx    Rectal cancer Neg Hx    Stomach cancer Neg Hx     Outpatient Encounter Medications as of 05/27/2022  Medication Sig   Accu-Chek FastClix Lancets MISC Accu-Chek Fastclix Lancet Drum  USE AS DIRECTED   Ascorbic Acid (VITAMIN C) 1000 MG tablet Take 1,000 mg by mouth daily.   aspirin 81 MG tablet Take 81 mg by mouth daily.   atorvastatin (LIPITOR) 10 MG tablet Take 10 mg by mouth daily.   Calcium Carb-Cholecalciferol 600-800 MG-UNIT TABS Take 1 tablet by mouth 2 (two) times daily.   cetirizine (ZYRTEC) 10 MG tablet Take 10 mg by mouth daily.   Continuous Blood Gluc Receiver (DEXCOM G6 RECEIVER)  DEVI Dexcom G6 Receiver misc  USE AS DIRECTED   Continuous Blood Gluc Transmit (DEXCOM G6 TRANSMITTER) MISC USE 1 TRANSMITTER EVERY 90 DAYS   Continuous Glucose Sensor (DEXCOM G7 SENSOR) MISC Inject 1 Application into the skin as directed. Change sensor every 10 days as directed.   insulin aspart (NOVOLOG) 100 UNIT/ML injection Use with pump for TDD around 60 units daily. Icd10 code e10.9   losartan (COZAAR) 100 MG tablet Take 100 mg by mouth daily.   Multiple Vitamin (MULTIVITAMIN WITH MINERALS) TABS tablet Take 1 tablet by mouth daily.   SM OMEGA-3-6-9 FATTY ACIDS PO Take 1,600 mg by mouth 2 (two) times daily.   triamcinolone cream (KENALOG) 0.1 % Apply 1 Application topically daily as needed (dermatitis).   venlafaxine XR (EFFEXOR-XR) 75 MG 24 hr capsule Take 75 mg by mouth daily with breakfast.   zinc sulfate 50 MG CAPS capsule Take 50 mg by mouth daily.   [DISCONTINUED] Continuous Blood Gluc Sensor (DEXCOM G6 SENSOR) MISC USE 1 SENSOR EVERY 10 DAYS TO MONITOR BLOOD GLUCOSE FOUR TIMES DAILY   [DISCONTINUED] Continuous Blood Gluc Sensor (DEXCOM G6 SENSOR) MISC USE 1 SENSOR EVERY 10 DAYS   No facility-administered encounter medications on file as of 05/27/2022.    ALLERGIES: Allergies  Allergen Reactions   Zonalon [Doxepin Hcl] Rash   Erythromycin Diarrhea, Nausea And Vomiting and Other (See Comments)    Other reaction(s): Stomach pain    VACCINATION STATUS: Immunization History  Administered Date(s) Administered   Moderna Sars-Covid-2 Vaccination 04/03/2019, 05/01/2019, 12/09/2019, 06/14/2020    Diabetes She presents for her follow-up diabetic visit. She has type 1 diabetes mellitus. Onset time: Diagnosed at approx age of 59. Her disease course has been stable. There are no hypoglycemic associated symptoms. There are no hypoglycemic complications. (In May 2021, she had hypoglycemic event where she was pulling into a parking space and she blacked out and required attention at the  ED.) Symptoms are stable. Diabetic complications include nephropathy, peripheral neuropathy, PVD and retinopathy. Risk factors for coronary artery disease include diabetes mellitus, hypertension, dyslipidemia, post-menopausal and sedentary lifestyle. Current diabetic treatment includes insulin pump. She is compliant with treatment all of the time. Her weight is decreasing steadily. She is following a generally healthy diet. Meal planning  includes avoidance of concentrated sweets and ADA exchanges. She has not had a previous visit with a dietitian. She participates in exercise intermittently. Her home blood glucose trend is fluctuating minimally. Her overall blood glucose range is 140-180 mg/dl. (She presents today with her CGM and pump showing mostly at goal glycemic profile.  Her POCT A1c today is 7.3%, increasing from last visit of 5.7%.  Analysis of her CGM shows TIR 52%, TAR 47%, TBR 1% with a GMI of 7.5%.  She denies any significant hypoglycemia.  She expressed her interest in switching to Omnipod 5 G7 Combo in the future.) An ACE inhibitor/angiotensin II receptor blocker is being taken. She does not see a podiatrist.Eye exam is current.  Hypertension This is a chronic problem. The current episode started more than 1 year ago. The problem has been resolved since onset. The problem is controlled. There are no associated agents to hypertension. Risk factors for coronary artery disease include diabetes mellitus, dyslipidemia, family history and post-menopausal state. Past treatments include angiotensin blockers. The current treatment provides significant improvement. There are no compliance problems.  Hypertensive end-organ damage includes kidney disease, PVD and retinopathy. Identifiable causes of hypertension include chronic renal disease.  Hyperlipidemia This is a chronic problem. The current episode started more than 1 year ago. The problem is controlled. Recent lipid tests were reviewed and are normal.  Exacerbating diseases include chronic renal disease and diabetes. There are no known factors aggravating her hyperlipidemia. Current antihyperlipidemic treatment includes statins. The current treatment provides moderate improvement of lipids. There are no compliance problems.  Risk factors for coronary artery disease include diabetes mellitus, dyslipidemia, family history, hypertension and post-menopausal.    Review of systems  Constitutional: + Minimally fluctuating body weight,  current Body mass index is 25.06 kg/m. , no fatigue, no subjective hyperthermia, no subjective hypothermia Eyes: no blurry vision, no xerophthalmia ENT: no sore throat, no nodules palpated in throat, no dysphagia/odynophagia, no hoarseness Cardiovascular: no chest pain, no shortness of breath, no palpitations, no leg swelling Respiratory: no cough, no shortness of breath Gastrointestinal: no nausea/vomiting/diarrhea Musculoskeletal: no muscle/joint aches Skin: no rashes, no hyperemia Neurological: no tremors, no numbness, no tingling, no dizziness Psychiatric: no depression, no anxiety  Objective:     BP 130/64 (BP Location: Right Arm, Patient Position: Sitting, Cuff Size: Large)   Pulse 77   Ht 5\' 5"  (1.651 m)   Wt 150 lb 9.6 oz (68.3 kg)   BMI 25.06 kg/m   Wt Readings from Last 3 Encounters:  05/27/22 150 lb 9.6 oz (68.3 kg)  11/17/21 157 lb 4.8 oz (71.4 kg)  11/09/21 153 lb (69.4 kg)     BP Readings from Last 3 Encounters:  05/27/22 130/64  01/21/22 118/69  11/17/21 (!) 164/79      Physical Exam- Limited  Constitutional:  Body mass index is 25.06 kg/m. , not in acute distress, normal state of mind Eyes:  EOMI, no exophthalmos Neck: Supple Musculoskeletal: no gross deformities, strength intact in all four extremities, no gross restriction of joint movements Skin:  no rashes, no hyperemia Neurological: no tremor with outstretched hands   Diabetic Foot Exam - Simple   No data filed      CMP ( most recent) CMP     Component Value Date/Time   NA 135 11/16/2021 0405   NA 140 05/10/2021 0000   K 4.3 11/17/2021 0427   CL 101 11/16/2021 0405   CO2 29 11/16/2021 0405   GLUCOSE 113 (H) 11/16/2021 0405  BUN 30 (H) 11/16/2021 0405   BUN 19 05/10/2021 0000   CREATININE 0.85 11/17/2021 0427   CALCIUM 9.4 11/16/2021 0405   ALBUMIN 4.2 05/10/2021 0000   AST 30 05/10/2021 0000   ALT 24 05/10/2021 0000   ALKPHOS 79 05/10/2021 0000   GFRNONAA >60 11/17/2021 0427   GFRAA >60 02/13/2016 1419     Diabetic Labs (most recent): Lab Results  Component Value Date   HGBA1C 5.7 (A) 01/21/2022   HGBA1C 6.9 09/18/2021   HGBA1C 6.5 05/10/2021   MICROALBUR 10 mg/L 01/21/2022   MICROALBUR 10 07/06/2020     Lipid Panel ( most recent) Lipid Panel     Component Value Date/Time   CHOL 147 05/10/2021 0000   TRIG 87 05/10/2021 0000   HDL 58 05/10/2021 0000   LDLCALC 74 05/10/2021 0000      No results found for: "TSH", "FREET4"         Assessment & Plan:   1) Type 1 diabetes mellitus without complication (HCC)  She presents today with her CGM and pump showing mostly at goal glycemic profile.  Her POCT A1c today is 7.3%, increasing from last visit of 5.7%.  Analysis of her CGM shows TIR 52%, TAR 47%, TBR 1% with a GMI of 7.5%.  She denies any significant hypoglycemia.  She expressed her interest in switching to Omnipod 5 G7 Combo in the future.  Connie Huang has currently uncontrolled symptomatic type 2 DM since 72 years of age.  -Recent labs reviewed.  - I had a long discussion with her about the progressive nature of diabetes and the pathology behind its complications. -her diabetes is complicated by retinopathy, PVD, mild CKD and she remains at a high risk for more acute and chronic complications which include CAD, CVA, CKD, retinopathy, and neuropathy. These are all discussed in detail with her.  - Nutritional counseling repeated at each appointment due  to patients tendency to fall back in to old habits.  - The patient admits there is a room for improvement in their diet and drink choices. -  Suggestion is made for the patient to avoid simple carbohydrates from their diet including Cakes, Sweet Desserts / Pastries, Ice Cream, Soda (diet and regular), Sweet Tea, Candies, Chips, Cookies, Sweet Pastries, Store Bought Juices, Alcohol in Excess of 1-2 drinks a day, Artificial Sweeteners, Coffee Creamer, and "Sugar-free" Products. This will help patient to have stable blood glucose profile and potentially avoid unintended weight gain.   - I encouraged the patient to switch to unprocessed or minimally processed complex starch and increased protein intake (animal or plant source), fruits, and vegetables.   - Patient is advised to stick to a routine mealtimes to eat 3 meals a day and avoid unnecessary snacks (to snack only to correct hypoglycemia).  - I have approached her with the following individualized plan to manage her diabetes and patient agrees:   -For safety purposes, avoiding hypoglycemia is the number 1 priority in her case.  I discussed with her that an acceptable target A1c for her would be between 7-7.5%.  -No changes will be made to her pump settings today as we have been successful in avoiding hypoglycemia on current settings.    -She would like to try the Omnipod 5 G7 combo when available.  Will go ahead and send in for Dexcom G7 to her pharmacy to start working on PA now.  -she is encouraged to continue monitoring glucose 4 times daily (using her CGM or  back up meter), before meals and before bed, and to call the clinic if she has readings less than 70 or above 200 for 3 tests in a row.  - she is warned not to take insulin without proper monitoring per orders. - Adjustment parameters are given to her for hypo and hyperglycemia in writing.  -Given her type 1 diabetes diagnosis, insulin is the only choice for treatment of her  diabetes.  - Specific targets for  A1c; LDL, HDL, and Triglycerides were discussed with the patient.  2) Blood Pressure /Hypertension:  her blood pressure is controlled to target.   she is advised to continue her current medications including Losartan 100 mg p.o. daily with breakfast.  3) Lipids/Hyperlipidemia:    Review of her recent lipid panel from 05/10/21 showed controlled LDL at 74 .  she is advised to continue Lipitor 10 mg daily at bedtime.  Side effects and precautions discussed with her.  4)  Weight/Diet:  her Body mass index is 25.06 kg/m.  -   she is not a candidate for weight loss. Exercise, and detailed carbohydrates information provided  -  detailed on discharge instructions.  5) Chronic Care/Health Maintenance: -she is on ACEI/ARB and Statin medications and is encouraged to initiate and continue to follow up with Ophthalmology, Dentist, Podiatrist at least yearly or according to recommendations, and advised to stay away from smoking. I have recommended yearly flu vaccine and pneumonia vaccine at least every 5 years; moderate intensity exercise for up to 150 minutes weekly; and sleep for at least 7 hours a day.  - she is advised to maintain close follow up with [name], DO for primary care needs, as well as her other providers for optimal and coordinated care.      I spent  51  minutes in the care of the patient today including review of labs from CMP, Lipids, Thyroid Function, Hematology (current and previous including abstractions from other facilities); face-to-face time discussing  her blood glucose readings/logs, discussing hypoglycemia and hyperglycemia episodes and symptoms, medications doses, her options of short and long term treatment based on the latest standards of care / guidelines;  discussion about incorporating lifestyle medicine;  and documenting the encounter. Risk reduction counseling performed per USPSTF guidelines to reduce obesity and cardiovascular  risk factors.     Please refer to Patient Instructions for Blood Glucose Monitoring and Insulin/Medications Dosing Guide"  in media tab for additional information. Please  also refer to " Patient Self Inventory" in the Media  tab for reviewed elements of pertinent patient history.  Connie Huang participated in the discussions, expressed understanding, and voiced agreement with the above plans.  All questions were answered to her satisfaction. she is encouraged to contact clinic should she have any questions or concerns prior to her return visit.   Follow up plan: - Return in about 4 months (around 09/26/2022) for Diabetes F/U with A1c in office, No previsit labs, Bring meter and logs.   Ronny Bacon, [facility] [address] [place], [place] [address] Phone: [phone] Fax: [phone]  05/27/2022, 2:31 PM

## 2022-06-27 ENCOUNTER — Telehealth: Payer: Self-pay | Admitting: *Deleted

## 2022-06-27 MED ORDER — INSULIN ASPART 100 UNIT/ML IJ SOLN: INTRAMUSCULAR | 3 refills | Status: DC

## 2022-06-27 NOTE — Telephone Encounter (Signed)
Sarah with [facility] on [address] in [place]  called and left a message that they need a new prescription for the patient's Novolog sent to them. Patients insurance advised them that they would have to do this because she had gotten  from [place] previously

## 2022-06-27 NOTE — Telephone Encounter (Signed)
done

## 2022-06-27 NOTE — Telephone Encounter (Signed)
Noted  

## 2022-07-03 ENCOUNTER — Other Ambulatory Visit: Payer: Self-pay | Admitting: Nurse Practitioner

## 2022-07-03 DIAGNOSIS — E109 Type 1 diabetes mellitus without complications: Secondary | ICD-10-CM

## 2022-09-26 ENCOUNTER — Ambulatory Visit (INDEPENDENT_AMBULATORY_CARE_PROVIDER_SITE_OTHER): Payer: Medicare Other | Admitting: Nurse Practitioner

## 2022-09-26 ENCOUNTER — Encounter: Payer: Self-pay | Admitting: Nurse Practitioner

## 2022-09-26 VITALS — BP 121/68 | HR 71 | Ht 65.0 in | Wt 153.8 lb

## 2022-09-26 DIAGNOSIS — E109 Type 1 diabetes mellitus without complications: Secondary | ICD-10-CM

## 2022-09-26 DIAGNOSIS — L609 Nail disorder, unspecified: Secondary | ICD-10-CM

## 2022-09-26 DIAGNOSIS — I10 Essential (primary) hypertension: Secondary | ICD-10-CM

## 2022-09-26 DIAGNOSIS — Z794 Long term (current) use of insulin: Secondary | ICD-10-CM | POA: Diagnosis not present

## 2022-09-26 DIAGNOSIS — E782 Mixed hyperlipidemia: Secondary | ICD-10-CM

## 2022-09-26 LAB — POCT GLYCOSYLATED HEMOGLOBIN (HGB A1C): Hemoglobin A1C: 6.6 % — AB (ref 4.0–5.6)

## 2022-09-26 MED ORDER — OMNIPOD 5 DEXG7G6 INTRO GEN 5 KIT: PACK | 0 refills | Status: DC

## 2022-09-26 MED ORDER — DEXCOM G7 SENSOR MISC: 1.0000 | 3 refills | Status: DC

## 2022-09-26 MED ORDER — OMNIPOD 5 DEXG7G6 PODS GEN 5 MISC: 11 refills | Status: DC

## 2022-09-26 NOTE — Progress Notes (Signed)
Endocrinology Follow Up Note       09/26/2022, 3:45 PM   Subjective:    Patient ID: Connie Huang, female    DOB: [birth year].  Connie Huang is being seen in follow up after being seen in consultation for management of currently uncontrolled symptomatic diabetes requested by [name], DO.   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   Hyperlipidemia    Hypertension    Hyponatremia 05/2015   Lumbar stenosis    Neuromuscular disorder (HCC)    Pneumonia 05/2015   PONV (postoperative nausea and vomiting)     Past Surgical History:  Procedure Laterality Date   ANTERIOR LAT LUMBAR FUSION Left 02/15/2016   Procedure: LEFT LUMBAR THREE-FOUR ANTEROLATERAL LUMBAR INTERBODY FUSION WITH LATERAL PLATE;  Surgeon: Maeola Harman, MD;  Location: [facility];  Service: Neurosurgery;  Laterality: Left;   BACK SURGERY     Lumbar 4 and 5 Fusion   CATARACT EXTRACTION Bilateral 2023   [place], [place]   COLONOSCOPY  2015   last colonoscopy was done in [place]   EYE SURGERY     PROCTOSCOPY N/A 11/14/2021   Procedure: RIGID PROCTOSCOPY;  Surgeon: Karie Soda, MD;  Location: [facility];  Service: General;  Laterality: N/A;   RECTOPEXY N/A 11/14/2021   Procedure: RECTOPEXY;  Surgeon: Karie Soda, MD;  Location: [facility];  Service: General;  Laterality: N/A;   TONSILLECTOMY      Social History   Socioeconomic History   Marital status: Widowed    Spouse name: Not on  file   Number of children: Not on file   Years of education: Not on file   Highest education level: Not on file  Occupational History   Occupation: retired  Tobacco Use   Smoking status: Former    Current packs/day: 0.00    Types: Cigarettes    Start date: 10/15/1977    Quit date: 10/15/1997    Years since quitting: 24.9   Smokeless tobacco: Never  Vaping Use   Vaping status: Never Used  Substance and Sexual Activity   Alcohol use: No   Drug use: No   Sexual activity: Not Currently  Other Topics Concern   Not on file  Social History Narrative   Not on file   Social Determinants of Health   Financial Resource Strain: Not on file  Food Insecurity: No Food Insecurity (11/14/2021)   Hunger Vital Sign    Worried About Running Out of Food in the Last Year: Never true    Ran Out of Food in the Last Year: Never true  Transportation Needs: No Transportation Needs (11/14/2021)  PRAPARE - Administrator, Civil Service (Medical): No    Lack of Transportation (Non-Medical): No  Physical Activity: Not on file  Stress: Not on file  Social Connections: Not on file    Family History  Problem Relation Age of Onset   Hypertension Mother    Hyperlipidemia Mother    Dementia Mother    Heart disease Father    Hyperlipidemia Father    Hypertension Father    Stroke Father    Heart attack Father    Colon cancer Neg Hx    Esophageal cancer Neg Hx    Rectal cancer Neg Hx    Stomach cancer Neg Hx     Outpatient Encounter Medications as of 09/26/2022  Medication Sig   Accu-Chek FastClix Lancets MISC Accu-Chek Fastclix Lancet Drum  USE AS DIRECTED   Ascorbic Acid (VITAMIN C) 1000 MG tablet Take 1,000 mg by mouth daily.   aspirin 81 MG tablet Take 81 mg by mouth daily.   atorvastatin (LIPITOR) 10 MG tablet Take 10 mg by mouth daily.   Biotin 40981 MCG TABS Take 10,000 mcg by mouth daily.   Calcium Carb-Cholecalciferol 600-800 MG-UNIT TABS Take 1 tablet by mouth 2 (two)  times daily.   cetirizine (ZYRTEC) 10 MG tablet Take 10 mg by mouth daily.   Continuous Blood Gluc Receiver (DEXCOM G6 RECEIVER) DEVI Dexcom G6 Receiver misc  USE AS DIRECTED   Continuous Glucose Transmitter (DEXCOM G6 TRANSMITTER) MISC USE 1 TRANSMITTER EVERY 90 DAYS   ferrous sulfate 325 (65 FE) MG EC tablet Take 325 mg by mouth daily.   insulin aspart (NOVOLOG) 100 UNIT/ML injection Use with pump for TDD around 60 units daily. Icd10 code e10.9   Insulin Disposable Pump (OMNIPOD 5 G6 INTRO, GEN 5,) KIT Change pod every 48-72 hours   Insulin Disposable Pump (OMNIPOD 5 G6 PODS, GEN 5,) MISC Change pod every 48-72 hours   losartan (COZAAR) 100 MG tablet Take 100 mg by mouth daily.   Magnesium 400 MG CAPS Take 400 mg by mouth daily.   Multiple Vitamin (MULTIVITAMIN WITH MINERALS) TABS tablet Take 1 tablet by mouth daily.   polyethylene glycol powder (GLYCOLAX/MIRALAX) 17 GM/SCOOP powder Take by mouth once. Patient reports that she takes 1 tablespoon daily   Psyllium (METAMUCIL PO) Take by mouth. Patient reports that she takes 1 gummy 3 times daily   SM OMEGA-3-6-9 FATTY ACIDS PO Take 1,600 mg by mouth 2 (two) times daily.   triamcinolone cream (KENALOG) 0.1 % Apply 1 Application topically daily as needed (dermatitis).   venlafaxine XR (EFFEXOR-XR) 75 MG 24 hr capsule Take 75 mg by mouth daily with breakfast.   zinc sulfate 50 MG CAPS capsule Take 50 mg by mouth daily.   Continuous Glucose Sensor (DEXCOM G7 SENSOR) MISC Inject 1 Application into the skin as directed. Change sensor every 10 days as directed.   [DISCONTINUED] Continuous Glucose Sensor (DEXCOM G7 SENSOR) MISC Inject 1 Application into the skin as directed. Change sensor every 10 days as directed. (Patient not taking: Reported on 09/26/2022)   No facility-administered encounter medications on file as of 09/26/2022.    ALLERGIES: Allergies  Allergen Reactions   Zonalon [Doxepin Hcl] Rash   Erythromycin Diarrhea, Nausea And Vomiting  and Other (See Comments)    Other reaction(s): Stomach pain    VACCINATION STATUS: Immunization History  Administered Date(s) Administered   Moderna Sars-Covid-2 Vaccination 04/03/2019, 05/01/2019, 12/09/2019, 06/14/2020    Diabetes She presents for her follow-up diabetic visit.  She has type 1 diabetes mellitus. Onset time: Diagnosed at approx age of 77. Her disease course has been stable. There are no hypoglycemic associated symptoms. There are no hypoglycemic complications. (In May 2021, she had hypoglycemic event where she was pulling into a parking space and she blacked out and required attention at the ED.) Symptoms are stable. Diabetic complications include nephropathy, peripheral neuropathy, PVD and retinopathy. Risk factors for coronary artery disease include diabetes mellitus, hypertension, dyslipidemia, post-menopausal and sedentary lifestyle. Current diabetic treatment includes insulin pump. She is compliant with treatment all of the time. Her weight is fluctuating minimally. She is following a generally healthy diet. Meal planning includes avoidance of concentrated sweets and ADA exchanges. She has not had a previous visit with a dietitian. She participates in exercise intermittently. Her home blood glucose trend is decreasing steadily. Her overall blood glucose range is 140-180 mg/dl. (She presents today with her CGM and pump showing tight fasting glycemic profile.  Her POCT A1c today is 6.6%, improving from last visit of 7.3%.  She notes she does have some nocturnal hypoglycemia.  Analysis of her CGM shows TIR 66%, TAR 32%, TBR 2% with a GMI of 7.2%.  She is still interested in Omnipod 5 G7 combo.) An ACE inhibitor/angiotensin II receptor blocker is being taken. She does not see a podiatrist.Eye exam is current.  Hypertension This is a chronic problem. The current episode started more than 1 year ago. The problem has been resolved since onset. The problem is controlled. There are no  associated agents to hypertension. Risk factors for coronary artery disease include diabetes mellitus, dyslipidemia, family history and post-menopausal state. Past treatments include angiotensin blockers. The current treatment provides significant improvement. There are no compliance problems.  Hypertensive end-organ damage includes kidney disease, PVD and retinopathy. Identifiable causes of hypertension include chronic renal disease.  Hyperlipidemia This is a chronic problem. The current episode started more than 1 year ago. The problem is controlled. Recent lipid tests were reviewed and are normal. Exacerbating diseases include chronic renal disease and diabetes. There are no known factors aggravating her hyperlipidemia. Current antihyperlipidemic treatment includes statins. The current treatment provides moderate improvement of lipids. There are no compliance problems.  Risk factors for coronary artery disease include diabetes mellitus, dyslipidemia, family history, hypertension and post-menopausal.    Review of systems  Constitutional: + Minimally fluctuating body weight,  current Body mass index is 25.59 kg/m. , no fatigue, no subjective hyperthermia, no subjective hypothermia Eyes: no blurry vision, no xerophthalmia ENT: no sore throat, no nodules palpated in throat, no dysphagia/odynophagia, no hoarseness Cardiovascular: no chest pain, no shortness of breath, no palpitations, no leg swelling Respiratory: no cough, no shortness of breath Gastrointestinal: no nausea/vomiting/diarrhea Musculoskeletal: no muscle/joint aches Skin: no rashes, no hyperemia, small area to top of left foot from old blister- still discolored, brittle nails with ridges Neurological: no tremors, no numbness, no tingling, no dizziness Psychiatric: no depression, no anxiety  Objective:     BP 121/68 (BP Location: Left Arm, Patient Position: Sitting, Cuff Size: Large)   Pulse 71   Ht 5\' 5"  (1.651 m)   Wt 153 lb 12.8  oz (69.8 kg)   BMI 25.59 kg/m   Wt Readings from Last 3 Encounters:  09/26/22 153 lb 12.8 oz (69.8 kg)  05/27/22 150 lb 9.6 oz (68.3 kg)  11/17/21 157 lb 4.8 oz (71.4 kg)     BP Readings from Last 3 Encounters:  09/26/22 121/68  05/27/22 130/64  01/21/22 118/69  Physical Exam- Limited  Constitutional:  Body mass index is 25.59 kg/m. , not in acute distress, normal state of mind Eyes:  EOMI, no exophthalmos Neck: Supple Musculoskeletal: no gross deformities, strength intact in all four extremities, no gross restriction of joint movements Skin:  no rashes, no hyperemia Neurological: no tremor with outstretched hands   Diabetic Foot Exam - Simple   No data filed     CMP ( most recent) CMP     Component Value Date/Time   NA 135 11/16/2021 0405   NA 140 05/10/2021 0000   K 4.3 11/17/2021 0427   CL 101 11/16/2021 0405   CO2 29 11/16/2021 0405   GLUCOSE 113 (H) 11/16/2021 0405   BUN 30 (H) 11/16/2021 0405   BUN 19 05/10/2021 0000   CREATININE 0.85 11/17/2021 0427   CALCIUM 9.4 11/16/2021 0405   ALBUMIN 4.2 05/10/2021 0000   AST 30 05/10/2021 0000   ALT 24 05/10/2021 0000   ALKPHOS 79 05/10/2021 0000   GFRNONAA >60 11/17/2021 0427   GFRAA >60 02/13/2016 1419     Diabetic Labs (most recent): Lab Results  Component Value Date   HGBA1C 6.6 (A) 09/26/2022   HGBA1C 5.7 (A) 01/21/2022   HGBA1C 6.9 09/18/2021   MICROALBUR 10 mg/L 01/21/2022   MICROALBUR 10 07/06/2020     Lipid Panel ( most recent) Lipid Panel     Component Value Date/Time   CHOL 147 05/10/2021 0000   TRIG 87 05/10/2021 0000   HDL 58 05/10/2021 0000   LDLCALC 74 05/10/2021 0000      No results found for: "TSH", "FREET4"         Assessment & Plan:   1) Type 1 diabetes mellitus without complication (HCC)  She presents today with her CGM and pump showing tight fasting glycemic profile.  Her POCT A1c today is 6.6%, improving from last visit of 7.3%.  She notes she does have some  nocturnal hypoglycemia.  Analysis of her CGM shows TIR 66%, TAR 32%, TBR 2% with a GMI of 7.2%.  She is still interested in Omnipod 5 G7 combo.  Connie Huang has currently uncontrolled symptomatic type 2 DM since 72 years of age.  -Recent labs reviewed.  - I had a long discussion with her about the progressive nature of diabetes and the pathology behind its complications. -her diabetes is complicated by retinopathy, PVD, mild CKD and she remains at a high risk for more acute and chronic complications which include CAD, CVA, CKD, retinopathy, and neuropathy. These are all discussed in detail with her.  - Nutritional counseling repeated at each appointment due to patients tendency to fall back in to old habits.  - The patient admits there is a room for improvement in their diet and drink choices. -  Suggestion is made for the patient to avoid simple carbohydrates from their diet including Cakes, Sweet Desserts / Pastries, Ice Cream, Soda (diet and regular), Sweet Tea, Candies, Chips, Cookies, Sweet Pastries, Store Bought Juices, Alcohol in Excess of 1-2 drinks a day, Artificial Sweeteners, Coffee Creamer, and "Sugar-free" Products. This will help patient to have stable blood glucose profile and potentially avoid unintended weight gain.   - I encouraged the patient to switch to unprocessed or minimally processed complex starch and increased protein intake (animal or plant source), fruits, and vegetables.   - Patient is advised to stick to a routine mealtimes to eat 3 meals a day and avoid unnecessary snacks (to snack only to correct  hypoglycemia).  - I have approached her with the following individualized plan to manage her diabetes and patient agrees:   -For safety purposes, avoiding hypoglycemia is the number 1 priority in her case.  I discussed with her that an acceptable target A1c for her would be between 7-7.5%.  -I did adjust her 12a-9a basal rate to 0.4 units/ hr to help avoid  nocturnal hypoglycemia.   -She would like to try the Omnipod 5 G7 combo and I did send this to [facility] for her today.  Will plan to use current pump settings in that.  -she is encouraged to continue monitoring glucose 4 times daily (using her CGM or back up meter), before meals and before bed, and to call the clinic if she has readings less than 70 or above 200 for 3 tests in a row.  - she is warned not to take insulin without proper monitoring per orders. - Adjustment parameters are given to her for hypo and hyperglycemia in writing.  -Given her type 1 diabetes diagnosis, insulin is the only choice for treatment of her diabetes.  - Specific targets for  A1c; LDL, HDL, and Triglycerides were discussed with the patient.  2) Blood Pressure /Hypertension:  her blood pressure is controlled to target.   she is advised to continue her current medications including Losartan 100 mg p.o. daily with breakfast.  3) Lipids/Hyperlipidemia:    Review of her recent lipid panel from 05/10/21 showed controlled LDL at 74 .  she is advised to continue Lipitor 10 mg daily at bedtime.  Side effects and precautions discussed with her.  4)  Weight/Diet:  her Body mass index is 25.59 kg/m.  -   she is not a candidate for weight loss. Exercise, and detailed carbohydrates information provided  -  detailed on discharge instructions.  5) Chronic Care/Health Maintenance: -she is on ACEI/ARB and Statin medications and is encouraged to initiate and continue to follow up with Ophthalmology, Dentist, Podiatrist at least yearly or according to recommendations, and advised to stay away from smoking. I have recommended yearly flu vaccine and pneumonia vaccine at least every 5 years; moderate intensity exercise for up to 150 minutes weekly; and sleep for at least 7 hours a day.  - she is advised to maintain close follow up with [name], DO for primary care needs, as well as her other providers for optimal and coordinated  care.   Given complaint of nail ridges, will check b12, Ferritin and thyroid prior to next visit.    I spent  36  minutes in the care of the patient today including review of labs from CMP, Lipids, Thyroid Function, Hematology (current and previous including abstractions from other facilities); face-to-face time discussing  her blood glucose readings/logs, discussing hypoglycemia and hyperglycemia episodes and symptoms, medications doses, her options of short and long term treatment based on the latest standards of care / guidelines;  discussion about incorporating lifestyle medicine;  and documenting the encounter. Risk reduction counseling performed per USPSTF guidelines to reduce obesity and cardiovascular risk factors.     Please refer to Patient Instructions for Blood Glucose Monitoring and Insulin/Medications Dosing Guide"  in media tab for additional information. Please  also refer to " Patient Self Inventory" in the Media  tab for reviewed elements of pertinent patient history.  Connie Huang participated in the discussions, expressed understanding, and voiced agreement with the above plans.  All questions were answered to her satisfaction. she is encouraged to contact  clinic should she have any questions or concerns prior to her return visit.   Follow up plan: - Return in about 3 months (around 12/27/2022) for Diabetes F/U with A1c in office, No previsit labs, Bring meter and logs.   Ronny Bacon, [facility] [address] [place], [place] [address] Phone: [phone] Fax: [phone]  09/26/2022, 3:45 PM

## 2022-09-30 ENCOUNTER — Telehealth: Payer: Self-pay | Admitting: Nurse Practitioner

## 2022-09-30 DIAGNOSIS — E109 Type 1 diabetes mellitus without complications: Secondary | ICD-10-CM

## 2022-09-30 NOTE — Telephone Encounter (Signed)
Pt needs prescription of Omni Pod sent to [facility]. Main [address] [place], [place]

## 2022-10-01 MED ORDER — OMNIPOD 5 DEXG7G6 PODS GEN 5 MISC
11 refills | Status: DC
Start: 2022-10-01 — End: 2022-12-30

## 2022-10-01 MED ORDER — OMNIPOD 5 DEXG7G6 INTRO GEN 5 KIT: PACK | 0 refills | Status: DC

## 2022-10-01 NOTE — Telephone Encounter (Signed)
Rx's sent to [facility] in [place] on [address] [facility] as requested per pt.

## 2022-10-02 LAB — COMPREHENSIVE METABOLIC PANEL
ALT: 20 IU/L (ref 0–32)
AST: 23 IU/L (ref 0–40)
Albumin: 4.2 g/dL (ref 3.8–4.8)
Alkaline Phosphatase: 78 IU/L (ref 44–121)
BUN/Creatinine Ratio: 27 (ref 12–28)
BUN: 26 mg/dL (ref 8–27)
Bilirubin Total: 0.3 mg/dL (ref 0.0–1.2)
CO2: 26 mmol/L (ref 20–29)
Calcium: 10.7 mg/dL — ABNORMAL HIGH (ref 8.7–10.3)
Chloride: 103 mmol/L (ref 96–106)
Creatinine, Ser: 0.95 mg/dL (ref 0.57–1.00)
Globulin, Total: 2.3 g/dL (ref 1.5–4.5)
Glucose: 146 mg/dL — ABNORMAL HIGH (ref 70–99)
Potassium: 4.3 mmol/L (ref 3.5–5.2)
Sodium: 141 mmol/L (ref 134–144)
Total Protein: 6.5 g/dL (ref 6.0–8.5)
eGFR: 63 mL/min/{1.73_m2} (ref >59)

## 2022-10-02 LAB — TSH: TSH: 2.46 u[IU]/mL (ref 0.450–4.500)

## 2022-10-02 LAB — FERRITIN: Ferritin: 144 ng/mL (ref 15–150)

## 2022-10-02 LAB — T4, FREE: Free T4: 0.82 ng/dL (ref 0.82–1.77)

## 2022-10-02 LAB — VITAMIN B12: Vitamin B-12: 1527 pg/mL — ABNORMAL HIGH (ref 232–1245)

## 2022-10-03 ENCOUNTER — Other Ambulatory Visit: Payer: Self-pay | Admitting: Nurse Practitioner

## 2022-10-03 ENCOUNTER — Encounter: Payer: Self-pay | Admitting: Nurse Practitioner

## 2022-10-03 DIAGNOSIS — E109 Type 1 diabetes mellitus without complications: Secondary | ICD-10-CM

## 2022-10-03 NOTE — Progress Notes (Signed)
Thanks

## 2022-10-26 ENCOUNTER — Other Ambulatory Visit: Payer: Self-pay | Admitting: Nurse Practitioner

## 2022-12-04 ENCOUNTER — Other Ambulatory Visit: Payer: Self-pay | Admitting: Nurse Practitioner

## 2022-12-13 LAB — HEPATIC FUNCTION PANEL
ALT: 22 U/L (ref 7–35)
AST: 22 (ref 13–35)
Alkaline Phosphatase: 71 (ref 25–125)
Bilirubin, Total: 0.5

## 2022-12-13 LAB — BASIC METABOLIC PANEL
BUN: 27 — AB (ref 4–21)
CO2: 30 — AB (ref 13–22)
Chloride: 104 (ref 99–108)
Creatinine: 0.8 (ref 0.5–1.1)
Glucose: 208
Potassium: 5.1 meq/L (ref 3.5–5.1)
Sodium: 139 (ref 137–147)

## 2022-12-13 LAB — LIPID PANEL
Cholesterol: 157 (ref 0–200)
HDL: 60 (ref 35–70)
LDL Cholesterol: 87
Triglycerides: 58 (ref 40–160)

## 2022-12-13 LAB — COMPREHENSIVE METABOLIC PANEL
Albumin: 4 (ref 3.5–5.0)
Calcium: 11.2 — AB (ref 8.7–10.7)
EGFR: 73

## 2022-12-17 ENCOUNTER — Telehealth: Payer: Self-pay | Admitting: *Deleted

## 2022-12-17 NOTE — Telephone Encounter (Signed)
Medicare is waiting for information from our office. The patient cannot get her CGM. Talked with Revonda Standard, the pharmacist with [facility] in [place] on [address]. She shares that Medicare is simply needing the patient's last office note faxed to them.  She provided two numbers for [place] to fax them to Medicare. [phone] , [phone]. She ask that is be noted Attention Medicare CGM. This has been completed. She also shared that the patient's current CGM will run out today at noon. Revonda Standard is going to start calling Medicare to see if she may expedite the process, and the patient get something today.  Patient was called and made aware that this had been done. A message was left.

## 2022-12-19 ENCOUNTER — Encounter: Payer: Self-pay | Admitting: Nurse Practitioner

## 2022-12-30 ENCOUNTER — Encounter: Payer: Self-pay | Admitting: Nurse Practitioner

## 2022-12-30 ENCOUNTER — Ambulatory Visit (INDEPENDENT_AMBULATORY_CARE_PROVIDER_SITE_OTHER): Payer: Medicare Other | Admitting: Nurse Practitioner

## 2022-12-30 VITALS — BP 126/69 | HR 78 | Ht 65.0 in | Wt 152.4 lb

## 2022-12-30 DIAGNOSIS — I10 Essential (primary) hypertension: Secondary | ICD-10-CM | POA: Diagnosis not present

## 2022-12-30 DIAGNOSIS — Z794 Long term (current) use of insulin: Secondary | ICD-10-CM | POA: Diagnosis not present

## 2022-12-30 DIAGNOSIS — E782 Mixed hyperlipidemia: Secondary | ICD-10-CM | POA: Diagnosis not present

## 2022-12-30 DIAGNOSIS — E109 Type 1 diabetes mellitus without complications: Secondary | ICD-10-CM

## 2022-12-30 LAB — POCT UA - MICROALBUMIN
Albumin/Creatinine Ratio, Urine, POC: <30
Creatinine, POC: 300 mg/dL
Microalbumin Ur, POC: 10 mg/L

## 2022-12-30 LAB — POCT GLYCOSYLATED HEMOGLOBIN (HGB A1C): Hemoglobin A1C: 6.7 % — AB (ref 4.0–5.6)

## 2022-12-30 NOTE — Progress Notes (Signed)
Endocrinology Follow Up Note       12/30/2022, 3:06 PM   Subjective:    Patient ID: Connie Huang, female    DOB: 05-08-1950.  Connie Huang is being seen in follow up after being seen in consultation for management of currently uncontrolled symptomatic diabetes requested by [name], DO.   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   Hyperlipidemia    Hypertension    Hyponatremia 05/2015   Lumbar stenosis    Neuromuscular disorder (HCC)    Pneumonia 05/2015   PONV (postoperative nausea and vomiting)     Past Surgical History:  Procedure Laterality Date   ANTERIOR LAT LUMBAR FUSION Left 02/15/2016   Procedure: LEFT LUMBAR THREE-FOUR ANTEROLATERAL LUMBAR INTERBODY FUSION WITH LATERAL PLATE;  Surgeon: Maeola Harman, MD;  Location: [facility];  Service: Neurosurgery;  Laterality: Left;   BACK SURGERY     Lumbar 4 and 5 Fusion   CATARACT EXTRACTION Bilateral 2023   [place], [place]   COLONOSCOPY  2015   last colonoscopy was done in [place]   EYE SURGERY     PROCTOSCOPY N/A 11/14/2021   Procedure: RIGID PROCTOSCOPY;  Surgeon: Karie Soda, MD;  Location: [facility];  Service: General;  Laterality: N/A;   RECTOPEXY N/A 11/14/2021   Procedure: RECTOPEXY;  Surgeon: Karie Soda, MD;  Location: [facility];  Service: General;  Laterality: N/A;   TONSILLECTOMY      Social History   Socioeconomic History   Marital status: Widowed    Spouse name: Not on  file   Number of children: Not on file   Years of education: Not on file   Highest education level: Not on file  Occupational History   Occupation: retired  Tobacco Use   Smoking status: Former    Current packs/day: 0.00    Types: Cigarettes    Start date: 10/15/1977    Quit date: 10/15/1997    Years since quitting: 25.2   Smokeless tobacco: Never  Vaping Use   Vaping status: Never Used  Substance and Sexual Activity   Alcohol use: No   Drug use: No   Sexual activity: Not Currently  Other Topics Concern   Not on file  Social History Narrative   Not on file   Social Determinants of Health   Financial Resource Strain: Not on file  Food Insecurity: No Food Insecurity (11/14/2021)   Hunger Vital Sign    Worried About Running Out of Food in the Last Year: Never true    Ran Out of Food in the Last Year: Never true  Transportation Needs: No Transportation Needs (11/14/2021)  PRAPARE - Administrator, Civil Service (Medical): No    Lack of Transportation (Non-Medical): No  Physical Activity: Not on file  Stress: Not on file  Social Connections: Not on file    Family History  Problem Relation Age of Onset   Hypertension Mother    Hyperlipidemia Mother    Dementia Mother    Heart disease Father    Hyperlipidemia Father    Hypertension Father    Stroke Father    Heart attack Father    Colon cancer Neg Hx    Esophageal cancer Neg Hx    Rectal cancer Neg Hx    Stomach cancer Neg Hx     Outpatient Encounter Medications as of 12/30/2022  Medication Sig   Accu-Chek FastClix Lancets MISC Accu-Chek Fastclix Lancet Drum  USE AS DIRECTED   Ascorbic Acid (VITAMIN C) 1000 MG tablet Take 1,000 mg by mouth daily.   aspirin 81 MG tablet Take 81 mg by mouth daily.   atorvastatin (LIPITOR) 10 MG tablet Take 10 mg by mouth daily.   Biotin 16109 MCG TABS Take 10,000 mcg by mouth daily.   Calcium Carb-Cholecalciferol 600-800 MG-UNIT TABS Take 1 tablet by mouth 2 (two)  times daily.   cetirizine (ZYRTEC) 10 MG tablet Take 10 mg by mouth daily.   Continuous Glucose Receiver (DEXCOM G7 RECEIVER) DEVI by Does not apply route.   Continuous Glucose Sensor (DEXCOM G7 SENSOR) MISC by Does not apply route.   ferrous sulfate 325 (65 FE) MG EC tablet Take 325 mg by mouth daily.   losartan (COZAAR) 100 MG tablet Take 100 mg by mouth daily.   Magnesium 400 MG CAPS Take 400 mg by mouth daily.   Multiple Vitamin (MULTIVITAMIN WITH MINERALS) TABS tablet Take 1 tablet by mouth daily.   NOVOLOG 100 UNIT/ML injection USE WITH INSULIN PUMP FOR A TOTAL DAILY DOSE OF 60 UNITS PER DAY   polyethylene glycol powder (GLYCOLAX/MIRALAX) 17 GM/SCOOP powder Take by mouth once. Patient reports that she takes 1 tablespoon daily   Psyllium (METAMUCIL PO) Take by mouth. Patient reports that she takes 1 gummy 3 times daily   SM OMEGA-3-6-9 FATTY ACIDS PO Take 1,600 mg by mouth 2 (two) times daily.   triamcinolone cream (KENALOG) 0.1 % Apply 1 Application topically daily as needed (dermatitis).   venlafaxine XR (EFFEXOR-XR) 75 MG 24 hr capsule Take 75 mg by mouth daily with breakfast.   zinc sulfate 50 MG CAPS capsule Take 50 mg by mouth daily.   [DISCONTINUED] Continuous Blood Gluc Receiver (DEXCOM G6 RECEIVER) DEVI Dexcom G6 Receiver misc  USE AS DIRECTED (Patient not taking: Reported on 12/30/2022)   [DISCONTINUED] Continuous Glucose Sensor (DEXCOM G6 SENSOR) MISC USE ONE SENSOR EVERY 10 DAYS (Patient not taking: Reported on 12/30/2022)   [DISCONTINUED] Continuous Glucose Transmitter (DEXCOM G6 TRANSMITTER) MISC USE 1 TRANSMITTER EVERY 90 DAYS (Patient not taking: Reported on 12/30/2022)   [DISCONTINUED] Insulin Disposable Pump (OMNIPOD 5 G6 INTRO, GEN 5,) KIT Change pod every 48-72 hours (Patient not taking: Reported on 12/30/2022)   [DISCONTINUED] Insulin Disposable Pump (OMNIPOD 5 G6 PODS, GEN 5,) MISC Change pod every 48-72 hours (Patient not taking: Reported on 12/30/2022)   No  facility-administered encounter medications on file as of 12/30/2022.    ALLERGIES: Allergies  Allergen Reactions   Zonalon [Doxepin Hcl] Rash   Erythromycin Diarrhea, Nausea And Vomiting and Other (See Comments)    Other reaction(s): Stomach pain    VACCINATION STATUS: Immunization History  Administered Date(s) Administered   Moderna Covid-19 Fall Seasonal Vaccine 23yrs & older 10/14/2022   Moderna Sars-Covid-2 Vaccination 04/03/2019, 05/01/2019, 12/09/2019, 06/14/2020    Diabetes She presents for her follow-up diabetic visit. She has type 1 diabetes mellitus. Onset time: Diagnosed at approx age of 28. Her disease course has been stable. There are no hypoglycemic associated symptoms. There are no hypoglycemic complications. (In May 2021, she had hypoglycemic event where she was pulling into a parking space and she blacked out and required attention at the ED.) Symptoms are stable. Diabetic complications include nephropathy, peripheral neuropathy, PVD and retinopathy. Risk factors for coronary artery disease include diabetes mellitus, hypertension, dyslipidemia, post-menopausal and sedentary lifestyle. Current diabetic treatment includes insulin pump. She is compliant with treatment all of the time. Her weight is fluctuating minimally. She is following a generally healthy diet. Meal planning includes avoidance of concentrated sweets and ADA exchanges. She has not had a previous visit with a dietitian. She participates in exercise intermittently. Her home blood glucose trend is fluctuating minimally. Her overall blood glucose range is 140-180 mg/dl. (She presents today with her CGM and pump showing at goal glycemic profile overall.  Her POCT A1c today is 6.7%, essentially unchanged from previous visit.  Since we adjusted night time basal rate last visit, she has not had any night time hypoglycemia.  She notes her insurance gave her a hard time about both the Dexcom G7 and the Omnipod 5.  She has  been fingersticking for the last several weeks.  She did bring her new Dexcom G7 sensor with her today for assistance with applying the first time.  ) An ACE inhibitor/angiotensin II receptor blocker is being taken. She does not see a podiatrist.Eye exam is current.  Hypertension This is a chronic problem. The current episode started more than 1 year ago. The problem has been resolved since onset. The problem is controlled. There are no associated agents to hypertension. Risk factors for coronary artery disease include diabetes mellitus, dyslipidemia, family history and post-menopausal state. Past treatments include angiotensin blockers. The current treatment provides significant improvement. There are no compliance problems.  Hypertensive end-organ damage includes kidney disease, PVD and retinopathy. Identifiable causes of hypertension include chronic renal disease.  Hyperlipidemia This is a chronic problem. The current episode started more than 1 year ago. The problem is controlled. Recent lipid tests were reviewed and are normal. Exacerbating diseases include chronic renal disease and diabetes. There are no known factors aggravating her hyperlipidemia. Current antihyperlipidemic treatment includes statins. The current treatment provides moderate improvement of lipids. There are no compliance problems.  Risk factors for coronary artery disease include diabetes mellitus, dyslipidemia, family history, hypertension and post-menopausal.    Review of systems  Constitutional: + Minimally fluctuating body weight,  current Body mass index is 25.36 kg/m. , no fatigue, no subjective hyperthermia, no subjective hypothermia Eyes: no blurry vision, no xerophthalmia ENT: no sore throat, no nodules palpated in throat, no dysphagia/odynophagia, no hoarseness Cardiovascular: no chest pain, no shortness of breath, no palpitations, no leg swelling Respiratory: no cough, no shortness of breath Gastrointestinal: no  nausea/vomiting/diarrhea Musculoskeletal: no muscle/joint aches Skin: no rashes, no hyperemia Neurological: no tremors, no numbness, no tingling, no dizziness Psychiatric: no depression, no anxiety  Objective:     BP 126/69 (BP Location: Left Arm, Patient Position: Sitting, Cuff Size: Large)   Pulse 78   Ht 5\' 5"  (1.651 m)   Wt 152 lb 6.4 oz (69.1 kg)   BMI 25.36 kg/m  Wt Readings from Last 3 Encounters:  12/30/22 152 lb 6.4 oz (69.1 kg)  09/26/22 153 lb 12.8 oz (69.8 kg)  05/27/22 150 lb 9.6 oz (68.3 kg)     BP Readings from Last 3 Encounters:  12/30/22 126/69  09/26/22 121/68  05/27/22 130/64       Physical Exam- Limited  Constitutional:  Body mass index is 25.36 kg/m. , not in acute distress, normal state of mind Eyes:  EOMI, no exophthalmos Musculoskeletal: no gross deformities, strength intact in all four extremities, no gross restriction of joint movements Skin:  no rashes, no hyperemia Neurological: no tremor with outstretched hands   Diabetic Foot Exam - Simple   No data filed     CMP ( most recent) CMP     Component Value Date/Time   NA 139 12/13/2022 0000   K 5.1 12/13/2022 0000   CL 104 12/13/2022 0000   CO2 30 (A) 12/13/2022 0000   GLUCOSE 146 (H) 10/01/2022 1646   GLUCOSE 113 (H) 11/16/2021 0405   BUN 27 (A) 12/13/2022 0000   CREATININE 0.8 12/13/2022 0000   CREATININE 0.95 10/01/2022 1646   CALCIUM 11.2 (A) 12/13/2022 0000   PROT 6.5 10/01/2022 1646   ALBUMIN 4.0 12/13/2022 0000   ALBUMIN 4.2 10/01/2022 1646   AST 22 12/13/2022 0000   ALT 22 12/13/2022 0000   ALKPHOS 71 12/13/2022 0000   BILITOT 0.3 10/01/2022 1646   GFRNONAA >60 11/17/2021 0427   GFRAA >60 02/13/2016 1419     Diabetic Labs (most recent): Lab Results  Component Value Date   HGBA1C 6.7 (A) 12/30/2022   HGBA1C 6.6 (A) 09/26/2022   HGBA1C 5.7 (A) 01/21/2022   MICROALBUR 10 mg/L 12/30/2022   MICROALBUR 10 mg/L 01/21/2022   MICROALBUR 10 07/06/2020     Lipid  Panel ( most recent) Lipid Panel     Component Value Date/Time   CHOL 157 12/13/2022 0000   TRIG 58 12/13/2022 0000   HDL 60 12/13/2022 0000   LDLCALC 87 12/13/2022 0000      Lab Results  Component Value Date   TSH 2.460 10/01/2022   FREET4 0.82 10/01/2022           Assessment & Plan:   1) Type 1 diabetes mellitus without complication (HCC)  She presents today with her CGM and pump showing at goal glycemic profile overall.  Her POCT A1c today is 6.7%, essentially unchanged from previous visit.  Since we adjusted night time basal rate last visit, she has not had any night time hypoglycemia.  She notes her insurance gave her a hard time about both the Dexcom G7 and the Omnipod 5.  She has been fingersticking for the last several weeks.  She did bring her new Dexcom G7 sensor with her today for assistance with applying the first time.   Connie Huang has currently uncontrolled symptomatic type 2 DM since 72 years of age.  -Recent labs reviewed.  - I had a long discussion with her about the progressive nature of diabetes and the pathology behind its complications. -her diabetes is complicated by retinopathy, PVD, mild CKD and she remains at a high risk for more acute and chronic complications which include CAD, CVA, CKD, retinopathy, and neuropathy. These are all discussed in detail with her.  - Nutritional counseling repeated at each appointment due to patients tendency to fall back in to old habits.  - The patient admits there is a room for improvement in their diet and  drink choices. -  Suggestion is made for the patient to avoid simple carbohydrates from their diet including Cakes, Sweet Desserts / Pastries, Ice Cream, Soda (diet and regular), Sweet Tea, Candies, Chips, Cookies, Sweet Pastries, Store Bought Juices, Alcohol in Excess of 1-2 drinks a day, Artificial Sweeteners, Coffee Creamer, and "Sugar-free" Products. This will help patient to have stable blood glucose  profile and potentially avoid unintended weight gain.   - I encouraged the patient to switch to unprocessed or minimally processed complex starch and increased protein intake (animal or plant source), fruits, and vegetables.   - Patient is advised to stick to a routine mealtimes to eat 3 meals a day and avoid unnecessary snacks (to snack only to correct hypoglycemia).  - I have approached her with the following individualized plan to manage her diabetes and patient agrees:   -For safety purposes, avoiding hypoglycemia is the number 1 priority in her case.  I discussed with her that an acceptable target A1c for her would be between 7-7.5%.  -I did not make any adjustments to her pump settings today.   -She would like to try the Omnipod 5 G7 combo and I did send this to [facility] for her but she says her insurance is giving her a hard time.  Will plan to retry sending in the prescription at the start of the new year.  She states she will be contacting her insurance agent to inquire about what Medicare wants to approve the Omnipod.  -she is encouraged to continue monitoring glucose 4 times daily (using her CGM or back up meter), before meals and before bed, and to call the clinic if she has readings less than 70 or above 200 for 3 tests in a row.  I did assist her in starting up her new Dexcom G7 sensor today.  - she is warned not to take insulin without proper monitoring per orders. - Adjustment parameters are given to her for hypo and hyperglycemia in writing.  -Given her type 1 diabetes diagnosis, insulin is the only choice for treatment of her diabetes.  - Specific targets for  A1c; LDL, HDL, and Triglycerides were discussed with the patient.  2) Blood Pressure /Hypertension:  her blood pressure is controlled to target.   she is advised to continue her current medications including Losartan 100 mg p.o. daily with breakfast.  3) Lipids/Hyperlipidemia:    Review of her recent lipid panel from  12/13/22 showed controlled LDL at 87 .  she is advised to continue Lipitor 10 mg daily at bedtime.  Side effects and precautions discussed with her.  4)  Weight/Diet:  her Body mass index is 25.36 kg/m.  -   she is not a candidate for weight loss. Exercise, and detailed carbohydrates information provided  -  detailed on discharge instructions.  5) Chronic Care/Health Maintenance: -she is on ACEI/ARB and Statin medications and is encouraged to initiate and continue to follow up with Ophthalmology, Dentist, Podiatrist at least yearly or according to recommendations, and advised to stay away from smoking. I have recommended yearly flu vaccine and pneumonia vaccine at least every 5 years; moderate intensity exercise for up to 150 minutes weekly; and sleep for at least 7 hours a day.  - she is advised to maintain close follow up with [name], DO for primary care needs, as well as her other providers for optimal and coordinated care.   Her calcium level was elevated on recent labs.  She notes she has been  taking extra calcium supplement on top of her multivitamin.  I encouraged her to stop the extra and only take the MVI now and we will recheck prior to next visit.  She does not have history of kidney stones, osteoporosis/osteopenia, no family history of parathyroid problems.     I spent  53  minutes in the care of the patient today including review of labs from CMP, Lipids, Thyroid Function, Hematology (current and previous including abstractions from other facilities); face-to-face time discussing  her blood glucose readings/logs, discussing hypoglycemia and hyperglycemia episodes and symptoms, medications doses, her options of short and long term treatment based on the latest standards of care / guidelines;  discussion about incorporating lifestyle medicine;  and documenting the encounter. Risk reduction counseling performed per USPSTF guidelines to reduce obesity and cardiovascular risk factors.      Please refer to Patient Instructions for Blood Glucose Monitoring and Insulin/Medications Dosing Guide"  in media tab for additional information. Please  also refer to " Patient Self Inventory" in the Media  tab for reviewed elements of pertinent patient history.  Connie Huang participated in the discussions, expressed understanding, and voiced agreement with the above plans.  All questions were answered to her satisfaction. she is encouraged to contact clinic should she have any questions or concerns prior to her return visit.   Follow up plan: - Return in about 3 months (around 04/01/2023) for Diabetes F/U with A1c in office, Previsit labs, Bring meter and logs.   Ronny Bacon, [facility] [address] [place], [place] [address] Phone: [phone] Fax: [phone]  12/30/2022, 3:06 PM

## 2023-01-14 ENCOUNTER — Telehealth: Payer: Self-pay | Admitting: *Deleted

## 2023-01-14 NOTE — Telephone Encounter (Signed)
Yes this was faxed back to the [facility] on 11/26. We did not know that the pt wanted it mailed. It had been at the front desk since then. I was under the impression that she was picking this up. This was sent to scan this a.m. We will need to wait until its scanned in the chart to print and then mail to her.

## 2023-01-14 NOTE — Telephone Encounter (Signed)
Lft patient VM

## 2023-01-14 NOTE — Telephone Encounter (Signed)
Patient has left a second message. She was seen  a few weeks ago. She had Connie Huang to complete paperwork for the [facility], patient is requesting that a copy be mailed to her for her personal files. I had looked last week and did not see it , not sure if it is waiting to be scanned in?

## 2023-01-15 NOTE — Telephone Encounter (Signed)
I called the patient and shared with her that this would be mailed to her once it had been scanned in. She is okay with this, she wants it for her records.

## 2023-02-11 ENCOUNTER — Other Ambulatory Visit (HOSPITAL_COMMUNITY): Payer: Self-pay

## 2023-02-27 ENCOUNTER — Telehealth: Payer: Self-pay | Admitting: *Deleted

## 2023-02-27 ENCOUNTER — Other Ambulatory Visit: Payer: Self-pay | Admitting: *Deleted

## 2023-02-27 ENCOUNTER — Other Ambulatory Visit (HOSPITAL_COMMUNITY): Payer: Self-pay

## 2023-02-27 ENCOUNTER — Telehealth: Payer: Self-pay

## 2023-02-27 MED ORDER — DEXCOM G7 SENSOR MISC: 9 refills | Status: DC

## 2023-02-27 NOTE — Telephone Encounter (Signed)
Patient called and shred that [facility] in [place] told her that they had sen [place] renewals for her Dexcom G7, as this was a request by her Medicare. Our office had not seen this, we also did not see that a recent prescription had been sent to the [facility]. We did send one in for the Dexcom G 7 sensors.  We will also check with the RX PA team to see if they have seen anything. Patient was called and made aware.

## 2023-02-27 NOTE — Telephone Encounter (Signed)
Pharmacy Patient Advocate Encounter   Received notification from Pt Calls Messages that prior authorization for Dexcom G7 sensor is required/requested.   Insurance verification completed.   The patient is insured through [employer] .   Per test claim: BILL CLAIM TO MEDICARE B

## 2023-02-28 ENCOUNTER — Other Ambulatory Visit: Payer: Self-pay | Admitting: *Deleted

## 2023-02-28 DIAGNOSIS — Z794 Long term (current) use of insulin: Secondary | ICD-10-CM

## 2023-02-28 DIAGNOSIS — E109 Type 1 diabetes mellitus without complications: Secondary | ICD-10-CM

## 2023-02-28 MED ORDER — DEXCOM G7 SENSOR MISC: 9 refills | Status: DC

## 2023-02-28 NOTE — Telephone Encounter (Signed)
Noted and a prescription has been sent to the [facility] in [place], with a note they are to bill Medicare part B.

## 2023-04-02 LAB — COMPREHENSIVE METABOLIC PANEL
ALT: 18 [IU]/L (ref 0–32)
AST: 22 [IU]/L (ref 0–40)
Albumin: 4.5 g/dL (ref 3.8–4.8)
Alkaline Phosphatase: 82 [IU]/L (ref 44–121)
BUN/Creatinine Ratio: 24 (ref 12–28)
BUN: 20 mg/dL (ref 8–27)
Bilirubin Total: 0.3 mg/dL (ref 0.0–1.2)
CO2: 27 mmol/L (ref 20–29)
Calcium: 10.6 mg/dL — ABNORMAL HIGH (ref 8.7–10.3)
Chloride: 100 mmol/L (ref 96–106)
Creatinine, Ser: 0.84 mg/dL (ref 0.57–1.00)
Globulin, Total: 2.4 g/dL (ref 1.5–4.5)
Glucose: 131 mg/dL — ABNORMAL HIGH (ref 70–99)
Potassium: 4.6 mmol/L (ref 3.5–5.2)
Sodium: 139 mmol/L (ref 134–144)
Total Protein: 6.9 g/dL (ref 6.0–8.5)
eGFR: 73 mL/min/{1.73_m2} (ref >59)

## 2023-04-02 LAB — MAGNESIUM: Magnesium: 2.1 mg/dL (ref 1.6–2.3)

## 2023-04-02 LAB — PHOSPHORUS: Phosphorus: 3.1 mg/dL (ref 3.0–4.3)

## 2023-04-02 LAB — PTH, INTACT AND CALCIUM: PTH: 24 pg/mL (ref 15–65)

## 2023-04-02 LAB — VITAMIN D 25 HYDROXY (VIT D DEFICIENCY, FRACTURES): Vit D, 25-Hydroxy: 48.7 ng/mL (ref 30.0–100.0)

## 2023-04-03 ENCOUNTER — Other Ambulatory Visit: Payer: Self-pay | Admitting: *Deleted

## 2023-04-03 DIAGNOSIS — Z794 Long term (current) use of insulin: Secondary | ICD-10-CM

## 2023-04-03 DIAGNOSIS — E109 Type 1 diabetes mellitus without complications: Secondary | ICD-10-CM

## 2023-04-03 MED ORDER — DEXCOM G7 SENSOR MISC: 9 refills | Status: DC

## 2023-04-08 ENCOUNTER — Encounter: Payer: Self-pay | Admitting: Nurse Practitioner

## 2023-04-08 ENCOUNTER — Ambulatory Visit (INDEPENDENT_AMBULATORY_CARE_PROVIDER_SITE_OTHER): Payer: Medicare Other | Admitting: Nurse Practitioner

## 2023-04-08 VITALS — BP 134/72 | HR 75 | Ht 65.0 in | Wt 153.8 lb

## 2023-04-08 DIAGNOSIS — R7989 Other specified abnormal findings of blood chemistry: Secondary | ICD-10-CM

## 2023-04-08 DIAGNOSIS — E782 Mixed hyperlipidemia: Secondary | ICD-10-CM

## 2023-04-08 DIAGNOSIS — E109 Type 1 diabetes mellitus without complications: Secondary | ICD-10-CM | POA: Diagnosis not present

## 2023-04-08 DIAGNOSIS — Z794 Long term (current) use of insulin: Secondary | ICD-10-CM | POA: Diagnosis not present

## 2023-04-08 DIAGNOSIS — I10 Essential (primary) hypertension: Secondary | ICD-10-CM | POA: Diagnosis not present

## 2023-04-08 DIAGNOSIS — L608 Other nail disorders: Secondary | ICD-10-CM

## 2023-04-08 LAB — POCT GLYCOSYLATED HEMOGLOBIN (HGB A1C): Hemoglobin A1C: 6.6 % — AB (ref 4.0–5.6)

## 2023-04-08 NOTE — Progress Notes (Signed)
 Endocrinology Follow Up Note       04/08/2023, 2:39 PM   Subjective:    Patient ID: Connie Huang, female    DOB: [birth year].  Connie Huang is being seen in follow up after being seen in consultation for management of currently uncontrolled symptomatic diabetes requested by Jonathon Bellows, DO.   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   Hyperlipidemia    Hypertension    Hyponatremia 05/2015   Lumbar stenosis    Neuromuscular disorder (HCC)    Pneumonia 05/2015   PONV (postoperative nausea and vomiting)     Past Surgical History:  Procedure Laterality Date   ANTERIOR LAT LUMBAR FUSION Left 02/15/2016   Procedure: LEFT LUMBAR THREE-FOUR ANTEROLATERAL LUMBAR INTERBODY FUSION WITH LATERAL PLATE;  Surgeon: Maeola Harman, MD;  Location: [facility];  Service: Neurosurgery;  Laterality: Left;   BACK SURGERY     Lumbar 4 and 5 Fusion   CATARACT EXTRACTION Bilateral 2023   [place], [place]   COLONOSCOPY  2015   last colonoscopy was done in [place]   EYE SURGERY     PROCTOSCOPY N/A 11/14/2021   Procedure: RIGID PROCTOSCOPY;  Surgeon: Karie Soda, MD;  Location: [facility];  Service: General;  Laterality: N/A;   RECTOPEXY N/A 11/14/2021   Procedure: RECTOPEXY;  Surgeon: Karie Soda, MD;  Location: [facility];  Service: General;  Laterality: N/A;   TONSILLECTOMY      Social History   Socioeconomic History   Marital status: Widowed    Spouse name: Not on  file   Number of children: Not on file   Years of education: Not on file   Highest education level: Not on file  Occupational History   Occupation: retired  Tobacco Use   Smoking status: Former    Current packs/day: 0.00    Types: Cigarettes    Start date: 10/15/1977    Quit date: 10/15/1997    Years since quitting: 25.4   Smokeless tobacco: Never  Vaping Use   Vaping status: Never Used  Substance and Sexual Activity   Alcohol use: No   Drug use: No   Sexual activity: Not Currently  Other Topics Concern   Not on file  Social History Narrative   Not on file   Social Drivers of Health   Financial Resource Strain: Not on file  Food Insecurity: No Food Insecurity (11/14/2021)   Hunger Vital Sign    Worried About Running Out of Food in the Last Year: Never true    Ran Out of Food in the Last Year: Never true  Transportation Needs: No Transportation Needs (11/14/2021)  PRAPARE - Administrator, Civil Service (Medical): No    Lack of Transportation (Non-Medical): No  Physical Activity: Not on file  Stress: Not on file  Social Connections: Not on file    Family History  Problem Relation Age of Onset   Hypertension Mother    Hyperlipidemia Mother    Dementia Mother    Heart disease Father    Hyperlipidemia Father    Hypertension Father    Stroke Father    Heart attack Father    Colon cancer Neg Hx    Esophageal cancer Neg Hx    Rectal cancer Neg Hx    Stomach cancer Neg Hx     Outpatient Encounter Medications as of 04/08/2023  Medication Sig   Accu-Chek FastClix Lancets MISC Accu-Chek Fastclix Lancet Drum  USE AS DIRECTED   Ascorbic Acid (VITAMIN C) 1000 MG tablet Take 1,000 mg by mouth daily.   aspirin 81 MG tablet Take 81 mg by mouth daily.   atorvastatin (LIPITOR) 10 MG tablet Take 10 mg by mouth daily.   Biotin 13086 MCG TABS Take 10,000 mcg by mouth daily.   Calcium Carb-Cholecalciferol 600-800 MG-UNIT TABS Take 1 tablet by mouth 2 (two) times  daily.   cetirizine (ZYRTEC) 10 MG tablet Take 10 mg by mouth daily.   Continuous Glucose Receiver (DEXCOM G7 RECEIVER) DEVI by Does not apply route.   Continuous Glucose Sensor (DEXCOM G7 SENSOR) MISC Change sensor every 10 days as directed by provider to monitor blood sugars.   ferrous sulfate 325 (65 FE) MG EC tablet Take 325 mg by mouth daily.   losartan (COZAAR) 100 MG tablet Take 100 mg by mouth daily.   Magnesium 400 MG CAPS Take 400 mg by mouth daily.   Multiple Vitamin (MULTIVITAMIN WITH MINERALS) TABS tablet Take 1 tablet by mouth daily.   NOVOLOG 100 UNIT/ML injection USE WITH INSULIN PUMP FOR A TOTAL DAILY DOSE OF 60 UNITS PER DAY   polyethylene glycol powder (GLYCOLAX/MIRALAX) 17 GM/SCOOP powder Take by mouth once. Patient reports that she takes 1 tablespoon daily   Psyllium (METAMUCIL PO) Take by mouth. Patient reports that she takes 1 gummy 3 times daily   SM OMEGA-3-6-9 FATTY ACIDS PO Take 1,600 mg by mouth 2 (two) times daily.   triamcinolone cream (KENALOG) 0.1 % Apply 1 Application topically daily as needed (dermatitis).   venlafaxine XR (EFFEXOR-XR) 75 MG 24 hr capsule Take 75 mg by mouth daily with breakfast.   zinc sulfate 50 MG CAPS capsule Take 50 mg by mouth daily.   No facility-administered encounter medications on file as of 04/08/2023.    ALLERGIES: Allergies  Allergen Reactions   Zonalon [Doxepin Hcl] Rash   Erythromycin Diarrhea, Nausea And Vomiting and Other (See Comments)    Other reaction(s): Stomach pain    VACCINATION STATUS: Immunization History  Administered Date(s) Administered   Moderna Covid-19 Fall Seasonal Vaccine 68yrs & older 10/14/2022   Moderna Sars-Covid-2 Vaccination 04/03/2019, 05/01/2019, 12/09/2019, 06/14/2020   Pfizer Covid-19 Vaccine Bivalent Booster 67yrs & up 11/20/2020   Pfizer(Comirnaty)Fall Seasonal Vaccine 12 years and older 01/14/2022    Diabetes She presents for her follow-up diabetic visit. She has type 1 diabetes  mellitus. Onset time: Diagnosed at approx age of 9. Her disease course has been stable. There are no hypoglycemic associated symptoms. There are no hypoglycemic complications. (In May 2021, she had hypoglycemic event where she was pulling into a parking space and she blacked out and required attention at  the ED.) Symptoms are stable. Diabetic complications include nephropathy, peripheral neuropathy, PVD and retinopathy. Risk factors for coronary artery disease include diabetes mellitus, hypertension, dyslipidemia, post-menopausal and sedentary lifestyle. Current diabetic treatment includes insulin pump. She is compliant with treatment all of the time. Her weight is fluctuating minimally. She is following a generally healthy diet. Meal planning includes avoidance of concentrated sweets and ADA exchanges. She has not had a previous visit with a dietitian. She participates in exercise intermittently. Her home blood glucose trend is fluctuating minimally. Her overall blood glucose range is 140-180 mg/dl. (She presents today with her CGM and pump showing at goal glycemic profile overall.  Her POCT A1c today is 6.6%, essentially unchanged from previous visit.  Her insurance did not cover Omnipod optimally.  Analysis of her CGM shows TIR 69%, TAR 31%, TBR 0% with a GMI of 7.2%.  There are no significant hypoglycemic episodes.  She does note her pump is cracked and will be reaching out to [employer] to get it replaced.  ) An ACE inhibitor/angiotensin II receptor blocker is being taken. She does not see a podiatrist.Eye exam is current.  Hypertension This is a chronic problem. The current episode started more than 1 year ago. The problem has been resolved since onset. The problem is controlled. There are no associated agents to hypertension. Risk factors for coronary artery disease include diabetes mellitus, dyslipidemia, family history and post-menopausal state. Past treatments include angiotensin blockers. The current  treatment provides significant improvement. There are no compliance problems.  Hypertensive end-organ damage includes kidney disease, PVD and retinopathy. Identifiable causes of hypertension include chronic renal disease.  Hyperlipidemia This is a chronic problem. The current episode started more than 1 year ago. The problem is controlled. Recent lipid tests were reviewed and are normal. Exacerbating diseases include chronic renal disease and diabetes. There are no known factors aggravating her hyperlipidemia. Current antihyperlipidemic treatment includes statins. The current treatment provides moderate improvement of lipids. There are no compliance problems.  Risk factors for coronary artery disease include diabetes mellitus, dyslipidemia, family history, hypertension and post-menopausal.    Review of systems  Constitutional: + Minimally fluctuating body weight,  current Body mass index is 25.59 kg/m. , no fatigue, no subjective hyperthermia, no subjective hypothermia Eyes: no blurry vision, no xerophthalmia ENT: no sore throat, no nodules palpated in throat, no dysphagia/odynophagia, no hoarseness Cardiovascular: no chest pain, no shortness of breath, no palpitations, no leg swelling Respiratory: no cough, no shortness of breath Gastrointestinal: no nausea/vomiting/diarrhea Musculoskeletal: no muscle/joint aches Skin: no rashes, no hyperemia, + nail ridges and cracks Neurological: no tremors, no numbness, no tingling, no dizziness Psychiatric: no depression, no anxiety  Objective:     BP 134/72 (BP Location: Right Arm, Patient Position: Sitting, Cuff Size: Large)   Pulse 75   Ht 5\' 5"  (1.651 m)   Wt 153 lb 12.8 oz (69.8 kg)   BMI 25.59 kg/m   Wt Readings from Last 3 Encounters:  04/08/23 153 lb 12.8 oz (69.8 kg)  12/30/22 152 lb 6.4 oz (69.1 kg)  09/26/22 153 lb 12.8 oz (69.8 kg)     BP Readings from Last 3 Encounters:  04/08/23 134/72  12/30/22 126/69  09/26/22 121/68        Physical Exam- Limited  Constitutional:  Body mass index is 25.59 kg/m. , not in acute distress, normal state of mind Eyes:  EOMI, no exophthalmos Musculoskeletal: no gross deformities, strength intact in all four extremities, no gross restriction of joint movements Skin:  no rashes, no hyperemia, + nail ridges and cracks, mild onychomycosis to left great toenail Neurological: no tremor with outstretched hands   Diabetic Foot Exam - Simple   No data filed     CMP ( most recent) CMP     Component Value Date/Time   NA 139 04/01/2023 1455   K 4.6 04/01/2023 1455   CL 100 04/01/2023 1455   CO2 27 04/01/2023 1455   GLUCOSE 131 (H) 04/01/2023 1455   GLUCOSE 113 (H) 11/16/2021 0405   BUN 20 04/01/2023 1455   CREATININE 0.84 04/01/2023 1455   CALCIUM 10.6 (H) 04/01/2023 1455   PROT 6.9 04/01/2023 1455   ALBUMIN 4.5 04/01/2023 1455   AST 22 04/01/2023 1455   ALT 18 04/01/2023 1455   ALKPHOS 82 04/01/2023 1455   BILITOT 0.3 04/01/2023 1455   GFRNONAA >60 11/17/2021 0427   GFRAA >60 02/13/2016 1419     Diabetic Labs (most recent): Lab Results  Component Value Date   HGBA1C 6.6 (A) 04/08/2023   HGBA1C 6.7 (A) 12/30/2022   HGBA1C 6.6 (A) 09/26/2022   MICROALBUR 10 mg/L 12/30/2022   MICROALBUR 10 mg/L 01/21/2022   MICROALBUR 10 07/06/2020     Lipid Panel ( most recent) Lipid Panel     Component Value Date/Time   CHOL 157 12/13/2022 0000   TRIG 58 12/13/2022 0000   HDL 60 12/13/2022 0000   LDLCALC 87 12/13/2022 0000      Lab Results  Component Value Date   TSH 2.460 10/01/2022   FREET4 0.82 10/01/2022           Assessment & Plan:   1) Type 1 diabetes mellitus without complication (HCC)  She presents today with her CGM and pump showing at goal glycemic profile overall.  Her POCT A1c today is 6.6%, essentially unchanged from previous visit.  Her insurance did not cover Omnipod optimally.  Analysis of her CGM shows TIR 69%, TAR 31%, TBR 0% with a GMI  of 7.2%.  There are no significant hypoglycemic episodes.  She does note her pump is cracked and will be reaching out to [employer] to get it replaced.   Connie Huang has currently uncontrolled symptomatic type 2 DM since 73 years of age.  -Recent labs reviewed.  - I had a long discussion with her about the progressive nature of diabetes and the pathology behind its complications. -her diabetes is complicated by retinopathy, PVD, mild CKD and she remains at a high risk for more acute and chronic complications which include CAD, CVA, CKD, retinopathy, and neuropathy. These are all discussed in detail with her.  - Nutritional counseling repeated at each appointment due to patients tendency to fall back in to old habits.  - The patient admits there is a room for improvement in their diet and drink choices. -  Suggestion is made for the patient to avoid simple carbohydrates from their diet including Cakes, Sweet Desserts / Pastries, Ice Cream, Soda (diet and regular), Sweet Tea, Candies, Chips, Cookies, Sweet Pastries, Store Bought Juices, Alcohol in Excess of 1-2 drinks a day, Artificial Sweeteners, Coffee Creamer, and "Sugar-free" Products. This will help patient to have stable blood glucose profile and potentially avoid unintended weight gain.   - I encouraged the patient to switch to unprocessed or minimally processed complex starch and increased protein intake (animal or plant source), fruits, and vegetables.   - Patient is advised to stick to a routine mealtimes to eat 3 meals a day and avoid  unnecessary snacks (to snack only to correct hypoglycemia).  - I have approached her with the following individualized plan to manage her diabetes and patient agrees:   -For safety purposes, avoiding hypoglycemia is the number 1 priority in her case.  I discussed with her that an acceptable target A1c for her would be between 7-7.5%.  -I did not make any adjustments to her pump settings today.    -she is encouraged to continue monitoring glucose 4 times daily (using her CGM or back up meter), before meals and before bed, and to call the clinic if she has readings less than 70 or above 200 for 3 tests in a row.   - she is warned not to take insulin without proper monitoring per orders. - Adjustment parameters are given to her for hypo and hyperglycemia in writing.  -Given her type 1 diabetes diagnosis, insulin is the only choice for treatment of her diabetes.  - Specific targets for  A1c; LDL, HDL, and Triglycerides were discussed with the patient.  2) Blood Pressure /Hypertension:  her blood pressure is controlled to target.   she is advised to continue her current medications including Losartan 100 mg p.o. daily with breakfast.  3) Lipids/Hyperlipidemia:    Review of her recent lipid panel from 12/13/22 showed controlled LDL at 87 .  she is advised to continue Lipitor 10 mg daily at bedtime.  Side effects and precautions discussed with her.  4)  Weight/Diet:  her Body mass index is 25.59 kg/m.  -   she is not a candidate for weight loss. Exercise, and detailed carbohydrates information provided  -  detailed on discharge instructions.  5) Chronic Care/Health Maintenance: -she is on ACEI/ARB and Statin medications and is encouraged to initiate and continue to follow up with Ophthalmology, Dentist, Podiatrist at least yearly or according to recommendations, and advised to stay away from smoking. I have recommended yearly flu vaccine and pneumonia vaccine at least every 5 years; moderate intensity exercise for up to 150 minutes weekly; and sleep for at least 7 hours a day.  - she is advised to maintain close follow up with Jonathon Bellows, DO for primary care needs, as well as her other providers for optimal and coordinated care.   Recent calcium level had improved, but she was still taking her extra supplement, PTH was normal ruling out parathyroid problem.  I advised her to stop it  for now.  I will check more comprehensive thyroid panel prior to next visit as potential cause for nail problems (cracking and ridges).      I spent  44  minutes in the care of the patient today including review of labs from CMP, Lipids, Thyroid Function, Hematology (current and previous including abstractions from other facilities); face-to-face time discussing  her blood glucose readings/logs, discussing hypoglycemia and hyperglycemia episodes and symptoms, medications doses, her options of short and long term treatment based on the latest standards of care / guidelines;  discussion about incorporating lifestyle medicine;  and documenting the encounter. Risk reduction counseling performed per USPSTF guidelines to reduce obesity and cardiovascular risk factors.     Please refer to Patient Instructions for Blood Glucose Monitoring and Insulin/Medications Dosing Guide"  in media tab for additional information. Please  also refer to " Patient Self Inventory" in the Media  tab for reviewed elements of pertinent patient history.  Connie Huang participated in the discussions, expressed understanding, and voiced agreement with the above plans.  All questions were answered  to her satisfaction. she is encouraged to contact clinic should she have any questions or concerns prior to her return visit.   Follow up plan: - Return in about 4 months (around 08/08/2023) for Diabetes F/U with A1c in office, Bring meter and logs, Previsit labs.   Ronny Bacon, [facility] [address] [place], [place] [address] Phone: [phone] Fax: [phone]  04/08/2023, 2:39 PM

## 2023-04-16 ENCOUNTER — Other Ambulatory Visit (HOSPITAL_COMMUNITY): Payer: Self-pay

## 2023-04-23 ENCOUNTER — Telehealth: Payer: Self-pay | Admitting: *Deleted

## 2023-04-23 NOTE — Telephone Encounter (Signed)
 Patient had left a message 04/22/23 , stating that her pharmacy was sending something to our office, so that she may get her insulin. Patient was called this morning and made aware that something, (PA) was in the box but it appears that the PA team has picked it up to work it.

## 2023-04-29 ENCOUNTER — Other Ambulatory Visit (HOSPITAL_COMMUNITY): Payer: Self-pay

## 2023-04-29 ENCOUNTER — Telehealth: Payer: Self-pay

## 2023-04-29 NOTE — Telephone Encounter (Signed)
*  Endo  Pharmacy Patient Advocate Encounter   Received notification from Fax that prior authorization for Novolog is required/requested.   Insurance verification completed.   The patient is insured through  [employer]  .   Per test claim: The current 83 day co-pay is, $105.00.  No PA needed at this time. This test claim was processed through [facility]- copay amounts may vary at other pharmacies due to pharmacy/plan contracts, or as the patient moves through the different stages of their insurance plan.     Called patients pharmacy and left message to process for generic.

## 2023-05-10 LAB — THYROID PEROXIDASE ANTIBODY: Thyroperoxidase Ab SerPl-aCnc: 21 [IU]/mL (ref 0–34)

## 2023-05-10 LAB — T4, FREE: Free T4: 0.8 ng/dL — ABNORMAL LOW (ref 0.82–1.77)

## 2023-05-10 LAB — THYROGLOBULIN ANTIBODY: Thyroglobulin Antibody: <1 [IU]/mL (ref 0.0–0.9)

## 2023-05-10 LAB — TSH: TSH: 2.27 u[IU]/mL (ref 0.450–4.500)

## 2023-05-12 ENCOUNTER — Telehealth: Payer: Self-pay | Admitting: Nurse Practitioner

## 2023-05-12 NOTE — Telephone Encounter (Signed)
 Mailed to pt and made her aware

## 2023-05-12 NOTE — Telephone Encounter (Signed)
 done

## 2023-05-12 NOTE — Addendum Note (Signed)
 Addended by: Dani Gobble on: 05/12/2023 08:25 AM   Modules accepted: Orders

## 2023-05-12 NOTE — Telephone Encounter (Signed)
 Pt did her labs to soon, can you place new orders

## 2023-07-24 ENCOUNTER — Telehealth: Payer: Self-pay | Admitting: *Deleted

## 2023-07-24 NOTE — Telephone Encounter (Signed)
 I guess we will.  Ill also make note in her chart to keep her at 3 month follow ups from now on.

## 2023-07-24 NOTE — Telephone Encounter (Signed)
 Patient may have another appointment as a overbook per [employer]. Whitney ask that we note that the patient keep her appointments at 3 months. This is to ensure that she can get her insulin , approved through Medicare. Please call the patient, thank you.

## 2023-07-24 NOTE — Telephone Encounter (Signed)
LVM for pt to call back,

## 2023-07-24 NOTE — Telephone Encounter (Signed)
 Both the patient and a [facility] in [place] have called. Patient will need appointment sooner than 08-12-23 in order to get her Novolog . The pharmacy and patient have talked with Medicare, and this is their protocol. Patient's last office notes dated April 08, 2023 are being faxed along with some recent lab work, to two different [employer] numbers provided by [name] the pharmacist. [phone] , [phone].

## 2023-07-25 NOTE — Telephone Encounter (Signed)
 Pt is scheduled for 07/31/23 and knows to get labs done

## 2023-07-29 LAB — TSH: TSH: 1.21 u[IU]/mL (ref 0.450–4.500)

## 2023-07-29 LAB — T4, FREE: Free T4: 0.8 ng/dL — ABNORMAL LOW (ref 0.82–1.77)

## 2023-07-29 LAB — T3, FREE: T3, Free: 2.1 pg/mL (ref 2.0–4.4)

## 2023-07-31 ENCOUNTER — Encounter: Payer: Self-pay | Admitting: Nurse Practitioner

## 2023-07-31 ENCOUNTER — Ambulatory Visit (INDEPENDENT_AMBULATORY_CARE_PROVIDER_SITE_OTHER): Admitting: Nurse Practitioner

## 2023-07-31 VITALS — BP 110/64 | HR 78 | Ht 65.0 in | Wt 149.4 lb

## 2023-07-31 DIAGNOSIS — E782 Mixed hyperlipidemia: Secondary | ICD-10-CM

## 2023-07-31 DIAGNOSIS — I10 Essential (primary) hypertension: Secondary | ICD-10-CM

## 2023-07-31 DIAGNOSIS — E109 Type 1 diabetes mellitus without complications: Secondary | ICD-10-CM | POA: Diagnosis not present

## 2023-07-31 DIAGNOSIS — Z794 Long term (current) use of insulin: Secondary | ICD-10-CM

## 2023-07-31 DIAGNOSIS — R7989 Other specified abnormal findings of blood chemistry: Secondary | ICD-10-CM

## 2023-07-31 LAB — POCT GLYCOSYLATED HEMOGLOBIN (HGB A1C): Hemoglobin A1C: 6.8 % — AB (ref 4.0–5.6)

## 2023-07-31 MED ORDER — INSULIN ASPART 100 UNIT/ML IJ SOLN: INTRAMUSCULAR | 3 refills | Status: DC

## 2023-07-31 MED ORDER — DEXCOM G7 SENSOR MISC: 9 refills | Status: DC

## 2023-07-31 NOTE — Progress Notes (Signed)
 Endocrinology Follow Up Note       07/31/2023, 1:51 PM   Subjective:    Patient ID: Connie Huang, female    DOB: July 10, 1950.  Connie Huang is being seen in follow up after being seen in consultation for management of currently uncontrolled symptomatic diabetes requested by Lonna Millman, DO.   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   Hyperlipidemia    Hypertension    Hyponatremia 05/2015   Lumbar stenosis    Neuromuscular disorder (HCC)    Pneumonia 05/2015   PONV (postoperative nausea and vomiting)     Past Surgical History:  Procedure Laterality Date   ANTERIOR LAT LUMBAR FUSION Left 02/15/2016   Procedure: LEFT LUMBAR THREE-FOUR ANTEROLATERAL LUMBAR INTERBODY FUSION WITH LATERAL PLATE;  Surgeon: Fairy Levels, MD;  Location: [facility];  Service: Neurosurgery;  Laterality: Left;   BACK SURGERY     Lumbar 4 and 5 Fusion   CATARACT EXTRACTION Bilateral 2023   [place], [place]   COLONOSCOPY  2015   last colonoscopy was done in [place]   EYE SURGERY     PROCTOSCOPY N/A 11/14/2021   Procedure: RIGID PROCTOSCOPY;  Surgeon: Sheldon Standing, MD;  Location: [facility];  Service: General;  Laterality: N/A;   RECTOPEXY N/A 11/14/2021   Procedure: RECTOPEXY;  Surgeon: Sheldon Standing, MD;  Location: [facility];  Service: General;  Laterality: N/A;   TONSILLECTOMY      Social History   Socioeconomic History   Marital status: Widowed    Spouse name: Not on  file   Number of children: Not on file   Years of education: Not on file   Highest education level: Not on file  Occupational History   Occupation: retired  Tobacco Use   Smoking status: Former    Current packs/day: 0.00    Types: Cigarettes    Start date: 10/15/1977    Quit date: 10/15/1997    Years since quitting: 25.8   Smokeless tobacco: Never  Vaping Use   Vaping status: Never Used  Substance and Sexual Activity   Alcohol use: No   Drug use: No   Sexual activity: Not Currently  Other Topics Concern   Not on file  Social History Narrative   Not on file   Social Drivers of Health   Financial Resource Strain: Not on file  Food Insecurity: No Food Insecurity (11/14/2021)   Hunger Vital Sign    Worried About Running Out of Food in the Last Year: Never true    Ran Out of Food in the Last Year: Never true  Transportation Needs: No Transportation Needs (11/14/2021)  PRAPARE - Administrator, Civil Service (Medical): No    Lack of Transportation (Non-Medical): No  Physical Activity: Not on file  Stress: Not on file  Social Connections: Not on file    Family History  Problem Relation Age of Onset   Hypertension Mother    Hyperlipidemia Mother    Dementia Mother    Heart disease Father    Hyperlipidemia Father    Hypertension Father    Stroke Father    Heart attack Father    Colon cancer Neg Hx    Esophageal cancer Neg Hx    Rectal cancer Neg Hx    Stomach cancer Neg Hx     Outpatient Encounter Medications as of 07/31/2023  Medication Sig   Accu-Chek FastClix Lancets MISC Accu-Chek Fastclix Lancet Drum  USE AS DIRECTED   Ascorbic Acid  (VITAMIN C ) 1000 MG tablet Take 1,000 mg by mouth daily.   aspirin  81 MG tablet Take 81 mg by mouth daily.   atorvastatin  (LIPITOR) 10 MG tablet Take 10 mg by mouth daily.   Biotin  10000 MCG TABS Take 10,000 mcg by mouth daily.   Calcium  Carb-Cholecalciferol  600-800 MG-UNIT TABS Take 1 tablet by mouth 2 (two) times  daily.   cetirizine (ZYRTEC) 10 MG tablet Take 10 mg by mouth daily.   Continuous Glucose Receiver (DEXCOM G7 RECEIVER) DEVI by Does not apply route.   ferrous sulfate  325 (65 FE) MG EC tablet Take 325 mg by mouth daily.   losartan  (COZAAR ) 100 MG tablet Take 100 mg by mouth daily.   Magnesium  400 MG CAPS Take 400 mg by mouth daily.   Multiple Vitamin (MULTIVITAMIN WITH MINERALS) TABS tablet Take 1 tablet by mouth daily.   polyethylene glycol powder (GLYCOLAX /MIRALAX ) 17 GM/SCOOP powder Take by mouth once. Patient reports that she takes 1 tablespoon daily   Psyllium (METAMUCIL PO) Take by mouth. Patient reports that she takes 1 gummy 3 times daily   SM OMEGA-3-6-9 FATTY ACIDS PO Take 1,600 mg by mouth 2 (two) times daily.   triamcinolone cream (KENALOG) 0.1 % Apply 1 Application topically daily as needed (dermatitis).   venlafaxine  XR (EFFEXOR -XR) 75 MG 24 hr capsule Take 75 mg by mouth daily with breakfast.   zinc  sulfate 50 MG CAPS capsule Take 50 mg by mouth daily.   [DISCONTINUED] Continuous Glucose Sensor (DEXCOM G7 SENSOR) MISC Change sensor every 10 days as directed by provider to monitor blood sugars.   [DISCONTINUED] NOVOLOG  100 UNIT/ML injection USE WITH INSULIN  PUMP FOR A TOTAL DAILY DOSE OF 60 UNITS PER DAY   Continuous Glucose Sensor (DEXCOM G7 SENSOR) MISC Change sensor every 10 days as directed by provider to monitor blood sugars.   insulin  aspart (NOVOLOG ) 100 UNIT/ML injection Use with pump for TDD around 60 units per day   No facility-administered encounter medications on file as of 07/31/2023.    ALLERGIES: Allergies  Allergen Reactions   Zonalon [Doxepin Hcl] Rash   Erythromycin Diarrhea, Nausea And Vomiting and Other (See Comments)    Other reaction(s): Stomach pain    VACCINATION STATUS: Immunization History  Administered Date(s) Administered   Moderna Covid-19 Fall Seasonal Vaccine 81yrs & older 10/14/2022   Moderna Sars-Covid-2 Vaccination 04/03/2019,  05/01/2019, 12/09/2019, 06/14/2020    Diabetes She presents for her follow-up diabetic visit. She has type 1 diabetes mellitus. Onset time: Diagnosed at approx age of 31. Her disease course has been stable. There are no hypoglycemic associated symptoms. There are no hypoglycemic complications. (In May  2021, she had hypoglycemic event where she was pulling into a parking space and she blacked out and required attention at the ED.) Symptoms are stable. Diabetic complications include nephropathy, peripheral neuropathy, PVD and retinopathy. Risk factors for coronary artery disease include diabetes mellitus, hypertension, dyslipidemia, post-menopausal and sedentary lifestyle. Current diabetic treatment includes insulin  pump. She is compliant with treatment all of the time. Her weight is fluctuating minimally. She is following a generally healthy diet. Meal planning includes avoidance of concentrated sweets and ADA exchanges. She has not had a previous visit with a dietitian. She participates in exercise intermittently. Her home blood glucose trend is fluctuating minimally. Her overall blood glucose range is 140-180 mg/dl. (She presents today with her CGM and pump showing at goal glycemic profile overall.  Her POCT A1c today is 6.8%, increasing slightly from last visit of 6.6%.   Analysis of her CGM shows TIR 55%, TAR 43%, TBR 3% with a GMI of 7.4%.  She does note some drops in glucose at times but attributes this to not eating on time.) An ACE inhibitor/angiotensin II receptor blocker is being taken. She does not see a podiatrist.Eye exam is current.  Hypertension This is a chronic problem. The current episode started more than 1 year ago. The problem has been resolved since onset. The problem is controlled. There are no associated agents to hypertension. Risk factors for coronary artery disease include diabetes mellitus, dyslipidemia, family history and post-menopausal state. Past treatments include angiotensin  blockers. The current treatment provides significant improvement. There are no compliance problems.  Hypertensive end-organ damage includes kidney disease, PVD and retinopathy. Identifiable causes of hypertension include chronic renal disease.  Hyperlipidemia This is a chronic problem. The current episode started more than 1 year ago. The problem is controlled. Recent lipid tests were reviewed and are normal. Exacerbating diseases include chronic renal disease and diabetes. There are no known factors aggravating her hyperlipidemia. Current antihyperlipidemic treatment includes statins. The current treatment provides moderate improvement of lipids. There are no compliance problems.  Risk factors for coronary artery disease include diabetes mellitus, dyslipidemia, family history, hypertension and post-menopausal.    Review of systems  Constitutional: + Minimally fluctuating body weight,  current Body mass index is 24.86 kg/m. , no fatigue, no subjective hyperthermia, no subjective hypothermia Eyes: no blurry vision, no xerophthalmia ENT: no sore throat, no nodules palpated in throat, no dysphagia/odynophagia, no hoarseness Cardiovascular: no chest pain, no shortness of breath, no palpitations, no leg swelling Respiratory: no cough, no shortness of breath Gastrointestinal: no nausea/vomiting/diarrhea Musculoskeletal: no muscle/joint aches Skin: no rashes, no hyperemia, varicose veins to BLE L>R Neurological: no tremors, no numbness, no tingling, no dizziness Psychiatric: no depression, no anxiety  Objective:     BP 110/64 (BP Location: Left Arm, Patient Position: Sitting, Cuff Size: Large)   Pulse 78   Ht 5' 5 (1.651 m)   Wt 149 lb 6.4 oz (67.8 kg)   BMI 24.86 kg/m   Wt Readings from Last 3 Encounters:  07/31/23 149 lb 6.4 oz (67.8 kg)  04/08/23 153 lb 12.8 oz (69.8 kg)  12/30/22 152 lb 6.4 oz (69.1 kg)     BP Readings from Last 3 Encounters:  07/31/23 110/64  04/08/23 134/72   12/30/22 126/69       Physical Exam- Limited  Constitutional:  Body mass index is 24.86 kg/m. , not in acute distress, normal state of mind Eyes:  EOMI, no exophthalmos Musculoskeletal: no gross deformities, strength intact in all four extremities, no  gross restriction of joint movements Skin:  no rashes, no hyperemia Neurological: no tremor with outstretched hands   Diabetic Foot Exam - Simple   No data filed     CMP ( most recent) CMP     Component Value Date/Time   NA 139 04/01/2023 1455   K 4.6 04/01/2023 1455   CL 100 04/01/2023 1455   CO2 27 04/01/2023 1455   GLUCOSE 131 (H) 04/01/2023 1455   GLUCOSE 113 (H) 11/16/2021 0405   BUN 20 04/01/2023 1455   CREATININE 0.84 04/01/2023 1455   CALCIUM  10.6 (H) 04/01/2023 1455   PROT 6.9 04/01/2023 1455   ALBUMIN 4.5 04/01/2023 1455   AST 22 04/01/2023 1455   ALT 18 04/01/2023 1455   ALKPHOS 82 04/01/2023 1455   BILITOT 0.3 04/01/2023 1455   GFRNONAA >60 11/17/2021 0427   GFRAA >60 02/13/2016 1419     Diabetic Labs (most recent): Lab Results  Component Value Date   HGBA1C 6.8 (A) 07/31/2023   HGBA1C 6.6 (A) 04/08/2023   HGBA1C 6.7 (A) 12/30/2022   MICROALBUR 10 mg/L 12/30/2022   MICROALBUR 10 mg/L 01/21/2022   MICROALBUR 10 07/06/2020     Lipid Panel ( most recent) Lipid Panel     Component Value Date/Time   CHOL 157 12/13/2022 0000   TRIG 58 12/13/2022 0000   HDL 60 12/13/2022 0000   LDLCALC 87 12/13/2022 0000      Lab Results  Component Value Date   TSH 1.210 07/28/2023   TSH 2.270 05/09/2023   TSH 2.460 10/01/2022   FREET4 0.80 (L) 07/28/2023   FREET4 0.80 (L) 05/09/2023   FREET4 0.82 10/01/2022           Assessment & Plan:   1) Type 1 diabetes mellitus without complication (HCC)  She presents today with her CGM and pump showing at goal glycemic profile overall.  Her POCT A1c today is 6.8%, increasing slightly from last visit of 6.6%.   Analysis of her CGM shows TIR 55%, TAR 43%, TBR  3% with a GMI of 7.4%.  She does note some drops in glucose at times but attributes this to not eating on time.  Connie Huang has currently uncontrolled symptomatic type 2 DM since 73 years of age.  -Recent labs reviewed.  - I had a long discussion with her about the progressive nature of diabetes and the pathology behind its complications. -her diabetes is complicated by retinopathy, PVD, mild CKD and she remains at a high risk for more acute and chronic complications which include CAD, CVA, CKD, retinopathy, and neuropathy. These are all discussed in detail with her.  - Nutritional counseling repeated at each appointment due to patients tendency to fall back in to old habits.  - The patient admits there is a room for improvement in their diet and drink choices. -  Suggestion is made for the patient to avoid simple carbohydrates from their diet including Cakes, Sweet Desserts / Pastries, Ice Cream, Soda (diet and regular), Sweet Tea, Candies, Chips, Cookies, Sweet Pastries, Store Bought Juices, Alcohol in Excess of 1-2 drinks a day, Artificial Sweeteners, Coffee Creamer, and Sugar-free Products. This will help patient to have stable blood glucose profile and potentially avoid unintended weight gain.   - I encouraged the patient to switch to unprocessed or minimally processed complex starch and increased protein intake (animal or plant source), fruits, and vegetables.   - Patient is advised to stick to a routine mealtimes to eat 3 meals  a day and avoid unnecessary snacks (to snack only to correct hypoglycemia).  - I have approached her with the following individualized plan to manage her diabetes and patient agrees:   -For safety purposes, avoiding hypoglycemia is the number 1 priority in her case.  I discussed with her that an acceptable target A1c for her would be between 7-7.5%.  -I did not make any adjustments to her pump settings today.   She has done very well for a while on  her current settings.  -she is encouraged to continue monitoring glucose 4 times daily (using her CGM or back up meter), before meals and before bed, and to call the clinic if she has readings less than 70 or above 200 for 3 tests in a row.   - she is warned not to take insulin  without proper monitoring per orders. - Adjustment parameters are given to her for hypo and hyperglycemia in writing.  -Given her type 1 diabetes diagnosis, insulin  is the only choice for treatment of her diabetes.  - Specific targets for  A1c; LDL, HDL, and Triglycerides were discussed with the patient.  2) Blood Pressure /Hypertension:  her blood pressure is controlled to target.   she is advised to continue her current medications including Losartan  100 mg p.o. daily with breakfast.  3) Lipids/Hyperlipidemia:    Review of her recent lipid panel from 12/13/22 showed controlled LDL at 87 .  she is advised to continue Lipitor 10 mg daily at bedtime.  Side effects and precautions discussed with her.  4)  Weight/Diet:  her Body mass index is 24.86 kg/m.  -   she is not a candidate for weight loss. Exercise, and detailed carbohydrates information provided  -  detailed on discharge instructions.  5) Chronic Care/Health Maintenance: -she is on ACEI/ARB and Statin medications and is encouraged to initiate and continue to follow up with Ophthalmology, Dentist, Podiatrist at least yearly or according to recommendations, and advised to stay away from smoking. I have recommended yearly flu vaccine and pneumonia vaccine at least every 5 years; moderate intensity exercise for up to 150 minutes weekly; and sleep for at least 7 hours a day.  6) Hypercalcemia PTH was normal, ruling out parathyroid problem.  She was taking calcium  supplement which I did have her stop.  Will check CMP on subsequent visits.  7) Abnormal thyroid  function tests Thyroid  labs recently checked due to fatigue and nail ridges.  Antibody testing was  negative, ruling out autoimmune thyroid  dysfunction.  TSH and Free T3 came back normal, but Free T4 is slightly low.  On recheck it was similar presentation with low FT4 but normal TSH and FT3.  Will recheck on subsequent visits.  - she is advised to maintain close follow up with Lonna Millman, DO for primary care needs, as well as her other providers for optimal and coordinated care.        I spent  46  minutes in the care of the patient today including review of labs from CMP, Lipids, Thyroid  Function, Hematology (current and previous including abstractions from other facilities); face-to-face time discussing  her blood glucose readings/logs, discussing hypoglycemia and hyperglycemia episodes and symptoms, medications doses, her options of short and long term treatment based on the latest standards of care / guidelines;  discussion about incorporating lifestyle medicine;  and documenting the encounter. Risk reduction counseling performed per USPSTF guidelines to reduce obesity and cardiovascular risk factors.     Please refer to Patient Instructions for  Blood Glucose Monitoring and Insulin /Medications Dosing Guide  in media tab for additional information. Please  also refer to  Patient Self Inventory in the Media  tab for reviewed elements of pertinent patient history.  Connie Huang participated in the discussions, expressed understanding, and voiced agreement with the above plans.  All questions were answered to her satisfaction. she is encouraged to contact clinic should she have any questions or concerns prior to her return visit.   Follow up plan: - Return in about 3 months (around 10/31/2023) for Diabetes F/U with A1c in office, No previsit labs, Bring meter and logs.   Benton Rio, [facility] [address] [place], [place] [address] Phone: [phone] Fax: [phone]  07/31/2023, 1:51 PM

## 2023-08-12 ENCOUNTER — Ambulatory Visit: Admitting: Nurse Practitioner

## 2023-09-12 ENCOUNTER — Telehealth: Payer: Self-pay | Admitting: *Deleted

## 2023-09-12 NOTE — Telephone Encounter (Signed)
 Patient left a voicemail. She states that she has been taking pain medication, muscle relaxer's for her neck. Her blood sugars are running in the 500's.  Patient was called and advised that she goes to the ED for further evaluation.

## 2023-09-19 ENCOUNTER — Telehealth: Payer: Self-pay | Admitting: *Deleted

## 2023-09-19 NOTE — Telephone Encounter (Signed)
 I pulled her CGM readings and she is definitely improving from last weekend.  Insulin  to carb ratio should be 1:8, can't go much tighter than that right now.  It may be best to increase her basal rate slightly in the interim.  She should be on 0.4 units per hour basal rate.  We can temporarily increase that to 0.6 units per hour to try and drive afternoon highs down.  She needs to be vigilant as it may drop her too quickly and subsequently cause hypoglycemia.

## 2023-09-19 NOTE — Telephone Encounter (Signed)
 Patient was called and made aware.

## 2023-09-19 NOTE — Telephone Encounter (Signed)
 Patient has left a voice message. She states that she is still having pain on both sides of the neck , more like spasms/ She has been in 4 hospitals. Finally was admitted over night . She reports that her blood sugars are crazy due to her pain, this has been ongoing for over a week.  She is asking if she needs to increase Carbs to Insulin  ratio?  Patient was called. She states that she has been using her pump for Basal, as they has her remove it at the hospital. She has been injecting Novolog  per SSL 5-10 units when she needs too. Last night her blood sugar was 148  and it showed .4 units of insulin  active at that time. This morning her blood sugar was 199, she had breakfast to include a cup of coffee and she made her breakfast. Blood sugar went up to 256, she had 40 grams of Carbs pump shows 7.1 units . While she was sharing this information she shared that her blood sugar was 327 and climbing.

## 2023-09-22 NOTE — Telephone Encounter (Signed)
 Patient left a voicemail with a progress report. Over the weekend, she got some descent sleep at night; She has been taking it slow and easy, and has been cautious about what she eats. However , this morning she had a terrible bout with diarrhea. For her pain she is wearing the Lidocaine  patches, and taking 1 Advil tablet . She cannot see her Orthopedic until 10/16/2023. Pain has eased some but certainly not gone. As far as blood sugars she notes that they may drop at supper time, and she corrects with a gluco tablet.

## 2023-09-23 ENCOUNTER — Encounter (HOSPITAL_COMMUNITY): Payer: Self-pay | Admitting: Emergency Medicine

## 2023-09-23 ENCOUNTER — Telehealth: Payer: Self-pay | Admitting: *Deleted

## 2023-09-23 ENCOUNTER — Other Ambulatory Visit: Payer: Self-pay

## 2023-09-23 ENCOUNTER — Emergency Department (HOSPITAL_COMMUNITY): Admission: EM | Admit: 2023-09-23 | Discharge: 2023-09-23 | Disposition: A | Discharge status: Home / Self Care

## 2023-09-23 DIAGNOSIS — E1010 Type 1 diabetes mellitus with ketoacidosis without coma: Secondary | ICD-10-CM | POA: Insufficient documentation

## 2023-09-23 DIAGNOSIS — R300 Dysuria: Secondary | ICD-10-CM | POA: Insufficient documentation

## 2023-09-23 DIAGNOSIS — R739 Hyperglycemia, unspecified: Secondary | ICD-10-CM

## 2023-09-23 DIAGNOSIS — Z794 Long term (current) use of insulin: Secondary | ICD-10-CM | POA: Diagnosis not present

## 2023-09-23 DIAGNOSIS — E875 Hyperkalemia: Secondary | ICD-10-CM | POA: Insufficient documentation

## 2023-09-23 DIAGNOSIS — Z7982 Long term (current) use of aspirin: Secondary | ICD-10-CM | POA: Diagnosis not present

## 2023-09-23 DIAGNOSIS — E1065 Type 1 diabetes mellitus with hyperglycemia: Secondary | ICD-10-CM | POA: Insufficient documentation

## 2023-09-23 LAB — CBC
HCT: 35.7 % — ABNORMAL LOW (ref 36.0–46.0)
Hemoglobin: 11.8 g/dL — ABNORMAL LOW (ref 12.0–15.0)
MCH: 33.1 pg (ref 26.0–34.0)
MCHC: 33.1 g/dL (ref 30.0–36.0)
MCV: 100.3 fL — ABNORMAL HIGH (ref 80.0–100.0)
Platelets: 344 K/uL (ref 150–400)
RBC: 3.56 MIL/uL — ABNORMAL LOW (ref 3.87–5.11)
RDW: 12.2 % (ref 11.5–15.5)
WBC: 5.2 K/uL (ref 4.0–10.5)
nRBC: 0 % (ref 0.0–0.2)

## 2023-09-23 LAB — URINALYSIS, ROUTINE W REFLEX MICROSCOPIC
Bacteria, UA: NONE SEEN
Bilirubin Urine: NEGATIVE
Glucose, UA: >=500 mg/dL — AB
Hgb urine dipstick: NEGATIVE
Ketones, ur: 20 mg/dL — AB
Nitrite: NEGATIVE
Protein, ur: NEGATIVE mg/dL
Specific Gravity, Urine: 1.017 (ref 1.005–1.030)
pH: 6 (ref 5.0–8.0)

## 2023-09-23 LAB — CBG MONITORING, ED
Glucose-Capillary: 397 mg/dL — ABNORMAL HIGH (ref 70–99)
Glucose-Capillary: 220 mg/dL — ABNORMAL HIGH (ref 70–99)

## 2023-09-23 LAB — BETA-HYDROXYBUTYRIC ACID: Beta-Hydroxybutyric Acid: 3.32 mmol/L — ABNORMAL HIGH (ref 0.05–0.27)

## 2023-09-23 LAB — POTASSIUM: Potassium: 5.3 mmol/L — ABNORMAL HIGH (ref 3.5–5.1)

## 2023-09-23 LAB — BASIC METABOLIC PANEL WITH GFR
Anion gap: 15 (ref 5–15)
BUN: 31 mg/dL — ABNORMAL HIGH (ref 8–23)
CO2: 25 mmol/L (ref 22–32)
Calcium: 10.1 mg/dL (ref 8.9–10.3)
Chloride: 92 mmol/L — ABNORMAL LOW (ref 98–111)
Creatinine, Ser: 1.11 mg/dL — ABNORMAL HIGH (ref 0.44–1.00)
GFR, Estimated: 52 mL/min — ABNORMAL LOW (ref >60)
Glucose, Bld: 439 mg/dL — ABNORMAL HIGH (ref 70–99)
Potassium: 5.7 mmol/L — ABNORMAL HIGH (ref 3.5–5.1)
Sodium: 132 mmol/L — ABNORMAL LOW (ref 135–145)

## 2023-09-23 MED ORDER — INSULIN ASPART 100 UNIT/ML IJ SOLN
8.0000 [IU] | Freq: Once | INTRAMUSCULAR | Status: AC
  Administered 2023-09-23: 8 [IU] via SUBCUTANEOUS
  Filled 2023-09-23: qty 1

## 2023-09-23 MED ORDER — SODIUM CHLORIDE 0.9 % IV BOLUS
1000.0000 mL | Freq: Once | INTRAVENOUS | Status: AC
  Administered 2023-09-23: 1000 mL via INTRAVENOUS

## 2023-09-23 NOTE — Discharge Instructions (Addendum)
 It was a pleasure taking care of you today.  Based on your history, physical exam, and labs I feel you are safe for discharge.  Today based off of your labs you are not in diabetic ketoacidosis.  However you were noted to have an elevated blood sugar.  Please follow-up with your primary care provider as well as endocrinology to continue ongoing diagnosis and treatment of your diabetes.  On your lab work it was also noted that you had an elevated potassium.  I recommend that you follow-up with your primary care provider and have this rechecked in the next 2 to 3 days.  As stated your urine has been sent off for a culture, you will receive a call from the lab if the culture is positive and you need antibiotics.  Otherwise please continue to monitor your symptoms, if you experience any of the following symptoms including but not limited to chest pain, shortness of breath, altered mental status, blurry vision, confusion, urinary symptoms, abdominal pain, nausea, vomiting, or other concerning symptom please return to the emergency department or seek further medical care. Your potassium today was 5.7 and on the redraw it was 5.3 if you would like to tell your primary care provider.

## 2023-09-23 NOTE — Telephone Encounter (Signed)
 Patient was called and made aware.

## 2023-09-23 NOTE — ED Provider Notes (Signed)
 [place] EMERGENCY DEPARTMENT AT [facility] Provider Note   CSN: [id number] Arrival date & time: 09/23/23  1606     Patient presents with: Hyperglycemia   Connie Huang is a 73 y.o. female.  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  She had a phone consult with her endocrinologist last week at which time they adjusted her basal rates on her insulin  pump.  When she woke this morning her blood glucose level was over 500 so she gave herself 8 units of insulin  NovoLog .  Her glucose level has not improved significantly with this added insulin  level.  She denies urinary symptoms although states she did not have urinary symptoms with her prior UTI diagnosis but does states she completed the full course of the Omnicef when she was discharged from [facility] on August 11.  She denies abdominal pain, she did have some nausea this morning without emesis.  She also denies chest pain, shortness of breath, diarrhea.  She does endorse having increased thirst just today but no polyuria.  Of note, patient states her blood sugars are usually under good control, however she has episodes of muscle spasms in her neck and her blood glucose levels always seem to rise with these episodes.  She had this complaint earlier this month but the neck symptoms have improved after treatment by her PCP.     The history is provided by the patient.       Prior to Admission medications   Medication Sig Start Date End Date Taking? Authorizing Provider  Ascorbic Acid  (VITAMIN C ) 1000 MG tablet Take 1,000 mg by mouth daily.   Yes [provider]  aspirin  81 MG tablet Take 81 mg by mouth daily.   Yes [provider]  atorvastatin  (LIPITOR) 10 MG tablet Take 10 mg by mouth daily.   Yes [provider]  Biotin  10000 MCG TABS Take 10,000 mcg by mouth daily.   Yes [provider]  cetirizine (ZYRTEC) 10 MG tablet Take 10 mg by mouth daily.   Yes [provider]  ferrous sulfate  325 (65 FE) MG EC tablet Take 325 mg by mouth daily.   Yes [provider]  HYDROcodone -acetaminophen  (NORCO/VICODIN) 5-325 MG tablet Take 1 tablet by mouth every 6 (six) hours as needed for moderate pain (pain score 4-6).   Yes [provider]  ibuprofen (ADVIL) 200 MG tablet Take 200 mg by mouth every 6 (six) hours as needed for mild pain (pain score 1-3).   Yes [provider]  insulin  aspart (NOVOLOG ) 100 UNIT/ML injection Use with pump for TDD around 60 units per day Patient taking differently: Inject 50-60 Units into the skin 3 (three) times daily with meals. Use with pump for TDD around 60 units per day 07/31/23  Yes Reardon, Benton PARAS, NP  Insulin  Human (INSULIN  PUMP) SOLN Inject 1 each into the skin every 3 (three) days. Medronics 770 device   Yes [provider]  lidocaine  (LIDODERM ) 5 % Place 1 patch onto the skin daily. Up to 12 hours.   Yes [provider]  losartan  (COZAAR ) 100 MG tablet Take 100 mg by mouth daily. 11/03/15  Yes [provider]  Magnesium  400 MG CAPS Take 400 mg by mouth daily.   Yes [provider]  morphine (MSIR) 15 MG tablet Take  15 mg by mouth every 8 (eight) hours as needed for severe pain (pain score 7-10).   Yes [provider]  Multiple Vitamin (MULTIVITAMIN WITH MINERALS) TABS tablet Take 1 tablet by mouth daily.   Yes [provider]  naloxone (NARCAN) nasal spray 4 mg/0.1 mL Place 1 spray into the nose once. 09/18/23  Yes [provider]  ondansetron  (ZOFRAN -ODT) 4 MG disintegrating tablet Take 4 mg by mouth every 6 (six) hours as needed for nausea.   Yes [provider]  polyethylene glycol powder (GLYCOLAX /MIRALAX ) 17 GM/SCOOP powder Take 5 g by mouth  once. Patient reports that she takes 1 tablespoon daily   Yes [provider]  Psyllium (METAMUCIL PO) Take 1 tablet by mouth in the morning and at bedtime. Gummies   Yes [provider]  SM OMEGA-3-6-9 FATTY ACIDS PO Take 1,600 mg by mouth 2 (two) times daily.   Yes [provider]  triamcinolone cream (KENALOG) 0.1 % Apply 1 Application topically daily as needed (dermatitis).   Yes [provider]  venlafaxine  XR (EFFEXOR -XR) 75 MG 24 hr capsule Take 75 mg by mouth daily with breakfast.   Yes [provider]  Accu-Chek FastClix Lancets MISC Accu-Chek Fastclix Lancet Drum  USE AS DIRECTED    [provider]  cefdinir (OMNICEF) 300 MG capsule Take 300 mg by mouth 2 (two) times daily.    [provider]  Continuous Glucose Receiver (DEXCOM G7 RECEIVER) DEVI by Does not apply route.    [provider]  Continuous Glucose Sensor (DEXCOM G7 SENSOR) MISC Change sensor every 10 days as directed by provider to monitor blood sugars. 07/31/23   Therisa Benton PARAS, NP    Allergies: Doxepin hcl, Erythromycin, and Other    Review of Systems  Constitutional:  Negative for chills and fever.  HENT:  Negative for congestion and sore throat.   Eyes: Negative.   Respiratory:  Negative for chest tightness and shortness of breath.   Cardiovascular:  Negative for chest pain.  Gastrointestinal:  Negative for abdominal pain and nausea.  Endocrine: Positive for polydipsia. Negative for polyuria.  Genitourinary: Negative.  Negative for dysuria and frequency.  Musculoskeletal:  Negative for arthralgias, joint swelling and neck pain.  Skin: Negative.  Negative for rash and wound.  Neurological:  Negative for dizziness, weakness, light-headedness, numbness and headaches.  Psychiatric/Behavioral: Negative.      Updated Vital Signs BP (!) 125/59   Pulse 87   Temp 98.3 F (36.8 C) (Oral)   Resp 18   Ht 5' 5 (1.651 m)   Wt 67.6 kg   SpO2  96%   BMI 24.79 kg/m   Physical Exam Vitals and nursing note reviewed.  Constitutional:      Appearance: She is well-developed.  HENT:     Head: Normocephalic and atraumatic.  Eyes:     Conjunctiva/sclera: Conjunctivae normal.  Cardiovascular:     Rate and Rhythm: Normal rate and regular rhythm.     Heart sounds: Normal heart sounds.  Pulmonary:     Effort: Pulmonary effort is normal.     Breath sounds: Normal breath sounds. No wheezing.  Abdominal:     General: Bowel sounds are normal.     Palpations: Abdomen is soft.     Tenderness: There is no abdominal tenderness. There is no guarding.  Musculoskeletal:        General: Normal range of motion.     Cervical back: Normal range of motion.  Skin:  General: Skin is warm and dry.  Neurological:     Mental Status: She is alert.     (all labs ordered are listed, but only abnormal results are displayed) Labs Reviewed  CBC - Abnormal; Notable for the following components:      Result Value   RBC 3.56 (*)    Hemoglobin 11.8 (*)    HCT 35.7 (*)    MCV 100.3 (*)    All other components within normal limits  BASIC METABOLIC PANEL WITH GFR - Abnormal; Notable for the following components:   Sodium 132 (*)    Potassium 5.7 (*)    Chloride 92 (*)    Glucose, Bld 439 (*)    BUN 31 (*)    Creatinine, Ser 1.11 (*)    GFR, Estimated 52 (*)    All other components within normal limits  BETA-HYDROXYBUTYRIC ACID - Abnormal; Notable for the following components:   Beta-Hydroxybutyric Acid 3.32 (*)    All other components within normal limits  POTASSIUM - Abnormal; Notable for the following components:   Potassium 5.3 (*)    All other components within normal limits  CBG MONITORING, ED - Abnormal; Notable for the following components:   Glucose-Capillary 397 (*)    All other components within normal limits  URINALYSIS, ROUTINE W REFLEX MICROSCOPIC    EKG: None  Radiology: No results found.   Procedures   Medications  Ordered in the ED  sodium chloride  0.9 % bolus 1,000 mL (1,000 mLs Intravenous New Bag/Given 09/23/23 1929)  insulin  aspart (novoLOG ) injection 8 Units (8 Units Subcutaneous Given 09/23/23 1930)    Clinical Course as of 09/23/23 2018  Tue Sep 23, 2023  1912 Hyperglycemia work-up vs. DKA [CH]    Clinical Course User Index [CH] Janetta Terrall [place], [place]                                 Medical Decision Making Patient presenting with persistent hyperglycemia despite increase in her basal rate on her insulin  pump under the guidance of her endocrinologist.  With her prior recent admission where she had DKA, also had a UTI.  No symptomatology to suggest ongoing UTI.  Pending urinalysis at this time.  Her initial CBG on arrival was 397, it increased to 439 on her be met.  She has been given subcu insulin  and IV normal saline.  Although her beta hydroxybutyrate acid is elevated at 3.32, this is not appear to be DKA as patient has a normal anion gap at 15.  Her CO2 is 25, she does have mild renal insufficiency with creatinine of 1.11 with a BUN of 31.  Repeat CBG currently pending along with urinalysis.  If if her glucose is improved, patient should be able to be discharged home with close follow-up with her PCP and/or endocrinology.  Antibiotics as appropriate if UA is positive.  Patient signed out to [facility], PA-C.  Amount and/or Complexity of Data Reviewed Labs: ordered.        Final diagnoses:  None    ED Discharge Orders     None          Magon Croson, PA-C 09/23/23 2018    Simon Lavonia SAILOR, MD 09/24/23 [phone]

## 2023-09-23 NOTE — Telephone Encounter (Signed)
 Thanks for the update.  She is looking a bit better per her CGM report.  We will stay the course for now.  If she experiences 3 lows in 1 week, have her reach out and we will likely back down on her basal rate again.

## 2023-09-23 NOTE — ED Triage Notes (Addendum)
 Pt to the ED with complaints of hyperglycemia for the past 2 weeks.  Pt states her dexcom shows a reading of HIGH.Pt is on an insulin  pump  CBG in triage is 397

## 2023-09-23 NOTE — Telephone Encounter (Signed)
 Received a call from a friend of patient. She shares that the patient is very, very sick. She is throwing up and her blood sugar is 500 something.  Per Whitney Reardon,NP - the patient should go to the ED now. Patient was called and made aware. Patient was called and made aware. She states that after throwing  up ,she felt better, felt a little stronger. She administered 8 units of her sliding scale  and at the time she talked with me , at 11:10 am she reports that her blood sugar is 420. Patient is using a regular meter as the CGM shared HIGH.  Patient is adamant that will come to [facility] ED , as she has been seen in 4 other hospitals for this severe neck pain that she has been having, which she feels is contributing to the elevation in her BD. As previously noted , patient cannot see specialist for this until 10/16/23.  Patient did share that last evening the pain was terrible and she has some Morphine that she was given to take , and that she did take that. Does not know if this contributed to the onset of the nausea and vomiting earlier this morning.

## 2023-09-24 NOTE — ED Provider Notes (Signed)
 This patient was signed out to myself by Mliss Narrow, PA-C at time of shift change.  Please see her note for further detail.  Briefly, patient is a 72 year old female with type 1 diabetes with insulin  pump who presents to the emergency department with a chief complaint of hyperglycemia.  She states that her sugars have been running much higher lately at home over the past 2 weeks.  She also appreciates 2 episodes of vomiting, one of which was a few days ago and then one today. Denies hematemesis. Also states she was recently treated for a urinary tract infection after being admitted to the hospital with DKA.   Workup at this time is significant for glucose of 439, potassium of 5.7.  Due to elevated potassium this lab was repeated and was noted to be 5.3.  EKG showed no changes consistent with hyperkalemia by my interpretation.  Urinalysis significant for moderate leukocytes however not overwhelming for infection, will plan for urine culture and allow lab/pharmacy to contact the patient if antibiotics are needed.  Patient given fluids for slightly elevated creatinine. Insulin  given for hyperglycemia.    Beta hydroxybutyric acid elevated at 3.32 however no elevated anion gap on BMP, low clinical suspicion for diabetic ketoacidosis since there is no anion gap.   Patient reassessed and overall clinically well-appearing, I feel this patient is stable for discharge at this time.  Instructed patient to follow-up with her primary care provider within the next 48 to 72 hours for a lab recheck regarding her potassium as well as her glucose.  Patient understanding of this.  I also instructed the patient that we will be culturing her urine and she will receive a call if antibiotics are necessary.  Patient agreeable with plan for discharge and outpatient follow-up. Instructed her to follow-up with her PCP as well as endocrinologist for ongoing diabetes management, patient states she has an upcoming appointment with  endocrinology in about 2 weeks.   Most likely diagnosis at this time is hyperglycemia, no evidence of diabetic ketoacidosis based off of today's workup as well as history and physical exam, urine not overwhelming for infection, will culture.  Return precautions given.  Patient discharged.    Mazzy Santarelli F, PA-C 09/24/23 0153    Simon Lavonia SAILOR, MD 09/24/23 [phone]

## 2023-09-26 LAB — URINE CULTURE: Culture: 10000 — AB

## 2023-09-27 ENCOUNTER — Telehealth (HOSPITAL_BASED_OUTPATIENT_CLINIC_OR_DEPARTMENT_OTHER): Payer: Self-pay

## 2023-09-27 NOTE — Telephone Encounter (Signed)
 Post ED Visit - Positive Culture Follow-up  Culture report reviewed by antimicrobial stewardship pharmacist: [facility] [x]  Dorn Poot, Pharm.D. []  Venetia Gully, Pharm.D., BCPS AQ-ID []  Garrel Crews, Pharm.D., BCPS []  Almarie Lunger, Pharm.D., BCPS []  [place], [address].D., BCPS, AAHIVP []  Rosaline Bihari, Pharm.D., BCPS, AAHIVP []  Vernell Meier, PharmD, BCPS []  Latanya Hint, PharmD, BCPS []  Donald Medley, PharmD, BCPS []  Rocky Bold, PharmD []  Dorothyann Alert, PharmD, BCPS []  Morene Babe, PharmD  [facility] []  Rosaline Edison, PharmD []  Romona Bliss, PharmD []  Dolphus Roller, PharmD []  Veva Seip, Rph []  Vernell Daunt) Leonce, PharmD []  Eva Allis, PharmD []  Rosaline Millet, PharmD []  Iantha Batch, PharmD []  Arvin Gauss, PharmD []  Wanda Hasting, PharmD []  Ronal Rav, PharmD []  Rocky Slade, PharmD []  Bard Jeans, PharmD   Positive urine culture No bacteruria, no s/s, no not treat. No further patient follow-up is required at this time.  Connie Huang 09/27/2023, 11:01 AM

## 2023-10-23 ENCOUNTER — Other Ambulatory Visit: Payer: Self-pay | Admitting: Nurse Practitioner

## 2023-10-24 LAB — T4, FREE: Free T4: 0.86 ng/dL (ref 0.82–1.77)

## 2023-10-24 LAB — TSH: TSH: 2.36 u[IU]/mL (ref 0.450–4.500)

## 2023-10-24 LAB — T3, FREE: T3, Free: 2.5 pg/mL (ref 2.0–4.4)

## 2023-10-30 ENCOUNTER — Ambulatory Visit (INDEPENDENT_AMBULATORY_CARE_PROVIDER_SITE_OTHER): Admitting: Nurse Practitioner

## 2023-10-30 ENCOUNTER — Encounter: Payer: Self-pay | Admitting: Nurse Practitioner

## 2023-10-30 VITALS — BP 118/66 | HR 87 | Ht 65.0 in | Wt 147.4 lb

## 2023-10-30 DIAGNOSIS — E109 Type 1 diabetes mellitus without complications: Secondary | ICD-10-CM | POA: Diagnosis not present

## 2023-10-30 DIAGNOSIS — Z794 Long term (current) use of insulin: Secondary | ICD-10-CM

## 2023-10-30 DIAGNOSIS — I10 Essential (primary) hypertension: Secondary | ICD-10-CM | POA: Diagnosis not present

## 2023-10-30 DIAGNOSIS — E782 Mixed hyperlipidemia: Secondary | ICD-10-CM

## 2023-10-30 LAB — POCT GLYCOSYLATED HEMOGLOBIN (HGB A1C): Hemoglobin A1C: 6.4 % — AB (ref 4.0–5.6)

## 2023-10-30 NOTE — Progress Notes (Signed)
 Endocrinology Follow Up Note       10/30/2023, 1:40 PM   Subjective:    Patient ID: Connie Huang, female    DOB: 08/02/1950.  Connie Huang is being seen in follow up after being seen in consultation for management of currently uncontrolled symptomatic diabetes requested by Lonna Millman, DO.   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   Hyperlipidemia    Hypertension    Hyponatremia 05/2015   Lumbar stenosis    Neuromuscular disorder (HCC)    Pneumonia 05/2015   PONV (postoperative nausea and vomiting)     Past Surgical History:  Procedure Laterality Date   ANTERIOR LAT LUMBAR FUSION Left 02/15/2016   Procedure: LEFT LUMBAR THREE-FOUR ANTEROLATERAL LUMBAR INTERBODY FUSION WITH LATERAL PLATE;  Surgeon: Fairy Levels, MD;  Location: [facility];  Service: Neurosurgery;  Laterality: Left;   BACK SURGERY     Lumbar 4 and 5 Fusion   CATARACT EXTRACTION Bilateral 2023   [place], [place]   COLONOSCOPY  2015   last colon[place] was done in [place]   EYE SURGERY     PROCTOSCOPY N/A 11/14/2021   Procedure: RIGID PROCTOSCOPY;  Surgeon: Sheldon Standing, MD;  Location: [facility];  Service: General;  Laterality: N/A;   RECTOPEXY N/A 11/14/2021   Procedure: RECTOPEXY;  Surgeon: Sheldon Standing, MD;  Location: [facility];  Service: General;  Laterality: N/A;   TONSILLECTOMY      Social History   Socioeconomic History   Marital status: Widowed    Spouse name: Not on  file   Number of children: Not on file   Years of education: Not on file   Highest education level: Not on file  Occupational History   Occupation: retired  Tobacco Use   Smoking status: Former    Current packs/day: 0.00    Types: Cigarettes    Start date: 10/15/1977    Quit date: 10/15/1997    Years since quitting: 26.0   Smokeless tobacco: Never  Vaping Use   Vaping status: Never Used  Substance and Sexual Activity   Alcohol use: No   Drug use: No   Sexual activity: Not Currently  Other Topics Concern   Not on file  Social History Narrative   Not on file   Social Drivers of Health   Financial Resource Strain: Not on file  Food Insecurity: No Food Insecurity (11/14/2021)   Hunger Vital Sign    Worried About Running Out of Food in the Last Year: Never true    Ran Out of Food in the Last Year: Never true  Transportation Needs: No Transportation Needs (11/14/2021)  PRAPARE - Administrator, Civil Service (Medical): No    Lack of Transportation (Non-Medical): No  Physical Activity: Not on file  Stress: Not on file  Social Connections: Not on file    Family History  Problem Relation Age of Onset   Hypertension Mother    Hyperlipidemia Mother    Dementia Mother    Heart disease Father    Hyperlipidemia Father    Hypertension Father    Stroke Father    Heart attack Father    Colon cancer Neg Hx    Esophageal cancer Neg Hx    Rectal cancer Neg Hx    Stomach cancer Neg Hx     Outpatient Encounter Medications as of 10/30/2023  Medication Sig   Accu-Chek FastClix Lancets MISC Accu-Chek Fastclix Lancet Drum  USE AS DIRECTED   Ascorbic Acid  (VITAMIN C ) 1000 MG tablet Take 1,000 mg by mouth daily.   aspirin  81 MG tablet Take 81 mg by mouth daily.   atorvastatin  (LIPITOR) 10 MG tablet Take 10 mg by mouth daily.   Biotin  10000 MCG TABS Take 10,000 mcg by mouth daily.   cetirizine (ZYRTEC) 10 MG tablet Take 10 mg by mouth daily.   Continuous Glucose Receiver  (DEXCOM G7 RECEIVER) DEVI by Does not apply route.   Continuous Glucose Sensor (DEXCOM G7 SENSOR) MISC Change sensor every 10 days as directed by provider to monitor blood sugars.   ferrous sulfate  325 (65 FE) MG EC tablet Take 325 mg by mouth daily.   HYDROcodone -acetaminophen  (NORCO/VICODIN) 5-325 MG tablet Take 1 tablet by mouth every 6 (six) hours as needed for moderate pain (pain score 4-6).   ibuprofen (ADVIL) 200 MG tablet Take 200 mg by mouth every 6 (six) hours as needed for mild pain (pain score 1-3).   insulin  aspart (NOVOLOG ) 100 UNIT/ML injection Use with pump for TDD around 60 units per day (Patient taking differently: Inject 50-60 Units into the skin 3 (three) times daily with meals. Use with pump for TDD around 60 units per day)   Insulin  Human (INSULIN  PUMP) SOLN Inject 1 each into the skin every 3 (three) days. Medronics 770 device   lidocaine  (LIDODERM ) 5 % Place 1 patch onto the skin daily. Up to 12 hours.   losartan  (COZAAR ) 100 MG tablet Take 100 mg by mouth daily.   Magnesium  400 MG CAPS Take 400 mg by mouth daily.   morphine (MSIR) 15 MG tablet Take 15 mg by mouth every 8 (eight) hours as needed for severe pain (pain score 7-10).   Multiple Vitamin (MULTIVITAMIN WITH MINERALS) TABS tablet Take 1 tablet by mouth daily.   naloxone (NARCAN) nasal spray 4 mg/0.1 mL Place 1 spray into the nose once.   ondansetron  (ZOFRAN -ODT) 4 MG disintegrating tablet Take 4 mg by mouth every 6 (six) hours as needed for nausea.   polyethylene glycol powder (GLYCOLAX /MIRALAX ) 17 GM/SCOOP powder Take 5 g by mouth once. Patient reports that she takes 1 tablespoon daily   Psyllium (METAMUCIL PO) Take 1 tablet by mouth in the morning and at bedtime. Gummies   SM OMEGA-3-6-9 FATTY ACIDS PO Take 1,600 mg by mouth 2 (two) times daily.   triamcinolone cream (KENALOG) 0.1 % Apply 1 Application topically daily as needed (dermatitis).   venlafaxine  XR (EFFEXOR -XR) 75 MG 24 hr capsule Take 75 mg by mouth  daily with breakfast.   [DISCONTINUED] cefdinir (OMNICEF) 300 MG capsule Take 300 mg by mouth 2 (two) times daily. (Patient not taking:  Reported on 10/30/2023)   No facility-administered encounter medications on file as of 10/30/2023.    ALLERGIES: Allergies  Allergen Reactions   Doxepin Hcl Rash, Dermatitis and Hives   Erythromycin Diarrhea, Nausea And Vomiting and Other (See Comments)    Severe stomach pain   Other Rash and Other (See Comments)    Zolana dermatology cream     VACCINATION STATUS: Immunization History  Administered Date(s) Administered   Moderna Covid-19 Fall Seasonal Vaccine 57yrs & older 10/14/2022   Moderna Sars-Covid-2 Vaccination 04/03/2019, 05/01/2019, 12/09/2019, 06/14/2020   Pfizer Covid-19 Vaccine Bivalent Booster 29yrs & up 11/20/2020   Pfizer(Comirnaty)Fall Seasonal Vaccine 12 years and older 01/14/2022    Diabetes She presents for her follow-up diabetic visit. She has type 1 diabetes mellitus. Onset time: Diagnosed at approx age of 69. Her disease course has been improving. There are no hypoglycemic associated symptoms. There are no hypoglycemic complications. (In May 2021, she had hypoglycemic event where she was pulling into a parking space and she blacked out and required attention at the ED.) Symptoms are stable. Diabetic complications include nephropathy and peripheral neuropathy. Risk factors for coronary artery disease include diabetes mellitus, hypertension, dyslipidemia, post-menopausal and sedentary lifestyle. Current diabetic treatment includes insulin  pump. She is compliant with treatment all of the time. Her weight is fluctuating minimally. She is following a generally healthy diet. Meal planning includes avoidance of concentrated sweets and ADA exchanges. She has not had a previous visit with a dietitian. She participates in exercise intermittently. Her home blood glucose trend is decreasing steadily. Her overall blood glucose range is 140-180 mg/dl.  (She presents today with her CGM and pump showing improving glycemic profile with tightening readings overall.  Her POCT A1c today is 6.4 %, improving from last visit of 6.8%.   Analysis of her CGM shows TIR 74%, TAR 19%, TBR 7% with a GMI of 6.5%.  She had called between visits for high sugars during illness, thus her basal rate was increased slightly.) An ACE inhibitor/angiotensin II receptor blocker is being taken. She does not see a podiatrist.Eye exam is current.    Review of systems  Constitutional: + Minimally fluctuating body weight,  current Body mass index is 24.53 kg/m. , no fatigue, no subjective hyperthermia, no subjective hypothermia Eyes: no blurry vision, no xerophthalmia ENT: no sore throat, no nodules palpated in throat, no dysphagia/odynophagia, no hoarseness Cardiovascular: no chest pain, no shortness of breath, no palpitations, no leg swelling Respiratory: no cough, no shortness of breath Gastrointestinal: no nausea/vomiting/diarrhea Musculoskeletal: + neck pain; + left foot pain (started 1-2 days ago) Skin: no rashes, no hyperemia, varicose veins to BLE L>R Neurological: no tremors, no numbness, no tingling, no dizziness Psychiatric: no depression, no anxiety  Objective:     BP 118/66 (BP Location: Right Arm, Patient Position: Sitting, Cuff Size: Large)   Pulse 87   Ht 5' 5 (1.651 m)   Wt 147 lb 6.4 oz (66.9 kg)   BMI 24.53 kg/m   Wt Readings from Last 3 Encounters:  10/30/23 147 lb 6.4 oz (66.9 kg)  09/23/23 149 lb (67.6 kg)  07/31/23 149 lb 6.4 oz (67.8 kg)     BP Readings from Last 3 Encounters:  10/30/23 118/66  09/23/23 (!) 125/59  07/31/23 110/64      Physical Exam- Limited  Constitutional:  Body mass index is 24.53 kg/m. , not in acute distress, normal state of mind Eyes:  EOMI, no exophthalmos Musculoskeletal: + left foot edematous and erythematous, no bites,  no obvious injury. Skin:  no rashes, no hyperemia Neurological: no tremor with  outstretched hands   Diabetic Foot Exam - Simple   No data filed     CMP ( most recent) CMP     Component Value Date/Time   NA 132 (L) 09/23/2023 1701   NA 139 04/01/2023 1455   K 5.7 (H) 09/23/2023 1701   K 5.3 (H) 09/23/2023 1701   CL 92 (L) 09/23/2023 1701   CO2 25 09/23/2023 1701   GLUCOSE 439 (H) 09/23/2023 1701   BUN 31 (H) 09/23/2023 1701   BUN 20 04/01/2023 1455   CREATININE 1.11 (H) 09/23/2023 1701   CALCIUM  10.1 09/23/2023 1701   PROT 6.9 04/01/2023 1455   ALBUMIN 4.5 04/01/2023 1455   AST 22 04/01/2023 1455   ALT 18 04/01/2023 1455   ALKPHOS 82 04/01/2023 1455   BILITOT 0.3 04/01/2023 1455   GFRNONAA 52 (L) 09/23/2023 1701   GFRAA >60 02/13/2016 1419     Diabetic Labs (most recent): Lab Results  Component Value Date   HGBA1C 6.4 (A) 10/30/2023   HGBA1C 6.8 (A) 07/31/2023   HGBA1C 6.6 (A) 04/08/2023   MICROALBUR 10 mg/L 12/30/2022   MICROALBUR 10 mg/L 01/21/2022   MICROALBUR 10 07/06/2020     Lipid Panel ( most recent) Lipid Panel     Component Value Date/Time   CHOL 157 12/13/2022 0000   TRIG 58 12/13/2022 0000   HDL 60 12/13/2022 0000   LDLCALC 87 12/13/2022 0000      Lab Results  Component Value Date   TSH 2.360 10/23/2023   TSH 1.210 07/28/2023   TSH 2.270 05/09/2023   TSH 2.460 10/01/2022   FREET4 0.86 10/23/2023   FREET4 0.80 (L) 07/28/2023   FREET4 0.80 (L) 05/09/2023   FREET4 0.82 10/01/2022           Assessment & Plan:   1) Type 1 diabetes mellitus without complication (HCC)  She presents today with her CGM and pump showing improving glycemic profile with tightening readings overall.  Her POCT A1c today is 6.4 %, improving from last visit of 6.8%.   Analysis of her CGM shows TIR 74%, TAR 19%, TBR 7% with a GMI of 6.5%.  She had called between visits for high sugars during illness, thus her basal rate was increased slightly.  [place] Candis NOVAK Baranek has currently uncontrolled symptomatic type 2 DM since 73 years of  age.  -Recent labs reviewed.  - I had a long discussion with her about the progressive nature of diabetes and the pathology behind its complications. -her diabetes is complicated by retinopathy, PVD, mild CKD and she remains at a high risk for more acute and chronic complications which include CAD, CVA, CKD, retinopathy, and neuropathy. These are all discussed in detail with her.  - Nutritional counseling repeated at each appointment due to patients tendency to fall back in to old habits.  - The patient admits there is a room for improvement in their diet and drink choices. -  Suggestion is made for the patient to avoid simple carbohydrates from their diet including Cakes, Sweet Desserts / Pastries, Ice Cream, Soda (diet and regular), Sweet Tea, Candies, Chips, Cookies, Sweet Pastries, Store Bought Juices, Alcohol in Excess of 1-2 drinks a day, Artificial Sweeteners, Coffee Creamer, and Sugar-free Products. This will help patient to have stable blood glucose profile and potentially avoid unintended weight gain.   - I encouraged the patient to switch to unprocessed or minimally processed complex  starch and increased protein intake (animal or plant source), fruits, and vegetables.   - Patient is advised to stick to a routine mealtimes to eat 3 meals a day and avoid unnecessary snacks (to snack only to correct hypoglycemia).  - I have approached her with the following individualized plan to manage her diabetes and patient agrees:   -For safety purposes, avoiding hypoglycemia is the number 1 priority in her case.  I discussed with her that an acceptable target A1c for her would be between 7-7.5%.  -I did adjust her basal rate back to 0.4 units/hr today.    -she is encouraged to continue monitoring glucose 4 times daily (using her CGM or back up meter), before meals and before bed, and to call the clinic if she has readings less than 70 or above 200 for 3 tests in a row.   - she is warned not to  take insulin  without proper monitoring per orders. - Adjustment parameters are given to her for hypo and hyperglycemia in writing.  -Given her type 1 diabetes diagnosis, insulin  is the only choice for treatment of her diabetes.  - Specific targets for  A1c; LDL, HDL, and Triglycerides were discussed with the patient.  2) Blood Pressure /Hypertension:  her blood pressure is controlled to target.   she is advised to continue her current medications including Losartan  100 mg p.o. daily with breakfast.  3) Lipids/Hyperlipidemia:    Review of her recent lipid panel from 12/13/22 showed controlled LDL at 87 .  she is advised to continue Lipitor 10 mg daily at bedtime.  Side effects and precautions discussed with her.  4)  Weight/Diet:  her Body mass index is 24.53 kg/m.  -   she is not a candidate for weight loss. Exercise, and detailed carbohydrates information provided  -  detailed on discharge instructions.  5) Chronic Care/Health Maintenance: -she is on ACEI/ARB and Statin medications and is encouraged to initiate and continue to follow up with Ophthalmology, Dentist, Podiatrist at least yearly or according to recommendations, and advised to stay away from smoking. I have recommended yearly flu vaccine and pneumonia vaccine at least every 5 years; moderate intensity exercise for up to 150 minutes weekly; and sleep for at least 7 hours a day.  6) Hypercalcemia PTH was normal, ruling out parathyroid problem.  She was taking calcium  supplement which I did have her stop.  Will check CMP on subsequent visits.  7) Abnormal thyroid  function tests-resolved Thyroid  labs recently checked due to fatigue and nail ridges.  Antibody testing was negative, ruling out autoimmune thyroid  dysfunction.  TSH and Free T3 came back normal, but Free T4 is slightly low.  On recheck it was similar presentation with low FT4 but normal TSH and FT3.    Repeat thyroid  labs were normal  - she is advised to maintain close  follow up with Lonna Millman, DO for primary care needs, as well as her other providers for optimal and coordinated care.  She has appt with [facility] tomorrow to evaluate her left foot pain.      I spent  42  minutes in the care of the patient today including review of labs from CMP, Lipids, Thyroid  Function, Hematology (current and previous including abstractions from other facilities); face-to-face time discussing  her blood glucose readings/logs, discussing hypoglycemia and hyperglycemia episodes and symptoms, medications doses, her options of short and long term treatment based on the latest standards of care / guidelines;  discussion about  incorporating lifestyle medicine;  and documenting the encounter. Risk reduction counseling performed per USPSTF guidelines to reduce obesity and cardiovascular risk factors.     Please refer to Patient Instructions for Blood Glucose Monitoring and Insulin /Medications Dosing Guide  in media tab for additional information. Please  also refer to  Patient Self Inventory in the Media  tab for reviewed elements of pertinent patient history.  Candis NOVAK Dierks participated in the discussions, expressed understanding, and voiced agreement with the above plans.  All questions were answered to her satisfaction. she is encouraged to contact clinic should she have any questions or concerns prior to her return visit.   Follow up plan: - Return in about 3 months (around 01/29/2024) for Diabetes F/U with A1c in office, No previsit labs, Bring meter and logs.   Benton Rio, [facility] [address] [place], [place] [address] Phone: [phone] Fax: [phone]  10/30/2023, 1:40 PM

## 2024-02-03 ENCOUNTER — Ambulatory Visit: Admitting: Nurse Practitioner

## 2024-02-03 ENCOUNTER — Other Ambulatory Visit: Payer: Self-pay | Admitting: Nurse Practitioner

## 2024-02-03 ENCOUNTER — Encounter: Payer: Self-pay | Admitting: Nurse Practitioner

## 2024-02-03 VITALS — BP 122/76 | HR 72 | Ht 65.0 in | Wt 148.2 lb

## 2024-02-03 DIAGNOSIS — E109 Type 1 diabetes mellitus without complications: Secondary | ICD-10-CM

## 2024-02-03 DIAGNOSIS — Z794 Long term (current) use of insulin: Secondary | ICD-10-CM

## 2024-02-03 DIAGNOSIS — E782 Mixed hyperlipidemia: Secondary | ICD-10-CM

## 2024-02-03 DIAGNOSIS — I10 Essential (primary) hypertension: Secondary | ICD-10-CM

## 2024-02-03 LAB — POCT GLYCOSYLATED HEMOGLOBIN (HGB A1C): Hemoglobin A1C: 6.9 % — AB (ref 4.0–5.6)

## 2024-02-03 MED ORDER — INSULIN ASPART 100 UNIT/ML IJ SOLN: INTRAMUSCULAR | 3 refills | Status: AC

## 2024-02-03 MED ORDER — DEXCOM G7 SENSOR MISC: 9 refills | Status: AC

## 2024-02-03 NOTE — Progress Notes (Signed)
 "                                                                        Endocrinology Follow Up Note       02/03/2024, 2:03 PM   Subjective:    Patient ID: RENIAH Huang, female    DOB: Jul 02, 1950.  Connie Huang is being seen in follow up after being seen in consultation for management of currently uncontrolled symptomatic diabetes requested by Lonna Millman, DO.   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   Hyperlipidemia    Hypertension    Hyponatremia 05/2015   Lumbar stenosis    Neuromuscular disorder (HCC)    Pneumonia 05/2015   PONV (postoperative nausea and vomiting)     Past Surgical History:  Procedure Laterality Date   ANTERIOR LAT LUMBAR FUSION Left 02/15/2016   Procedure: LEFT LUMBAR THREE-FOUR ANTEROLATERAL LUMBAR INTERBODY FUSION WITH LATERAL PLATE;  Surgeon: Fairy Levels, MD;  Location: [facility];  Service: Neurosurgery;  Laterality: Left;   BACK SURGERY     Lumbar 4 and 5 Fusion   CATARACT EXTRACTION Bilateral 2023   [place], [place]   COLONOSCOPY  2015   last colonoscopy was done in [facility]   EYE SURGERY     PROCTOSCOPY N/A 11/14/2021   Procedure: RIGID PROCTOSCOPY;  Surgeon: Sheldon Standing, MD;  Location: [facility];  Service: General;  Laterality: N/A;   RECTOPEXY N/A 11/14/2021   Procedure: RECTOPEXY;  Surgeon: Sheldon Standing, MD;  Location: [facility];  Service: General;  Laterality: N/A;   TONSILLECTOMY      Social History   Socioeconomic History   Marital status: Widowed    Spouse name: Not on  file   Number of children: Not on file   Years of education: Not on file   Highest education level: Not on file  Occupational History   Occupation: retired  Tobacco Use   Smoking status: Former    Current packs/day: 0.00    Types: Cigarettes    Start date: 10/15/1977    Quit date: 10/15/1997    Years since quitting: 26.3   Smokeless tobacco: Never  Vaping Use   Vaping status: Never Used  Substance and Sexual Activity   Alcohol use: No   Drug use: No   Sexual activity: Not Currently  Other Topics Concern   Not on file  Social History Narrative   Not on file   Social Drivers of Health   Tobacco Use: Medium Risk (02/03/2024)   Patient History    Smoking Tobacco Use: Former    Smokeless Tobacco Use: Never    Passive Exposure: Not on Actuary Strain:  Not on file  Food Insecurity: No Food Insecurity (11/14/2021)   Hunger Vital Sign    Worried About Running Out of Food in the Last Year: Never true    Ran Out of Food in the Last Year: Never true  Transportation Needs: No Transportation Needs (11/14/2021)   PRAPARE - Administrator, Civil Service (Medical): No    Lack of Transportation (Non-Medical): No  Physical Activity: Not on file  Stress: Not on file  Social Connections: Not on file  Depression (EYV7-0): Not on file  Alcohol Screen: Not on file  Housing: Low Risk (11/14/2021)   Housing    Last Housing Risk Score: 0  Utilities: Not At Risk (11/14/2021)   AHC Utilities    Threatened with loss of utilities: No  Health Literacy: Not on file    Family History  Problem Relation Age of Onset   Hypertension Mother    Hyperlipidemia Mother    Dementia Mother    Heart disease Father    Hyperlipidemia Father    Hypertension Father    Stroke Father    Heart attack Father    Colon cancer Neg Hx    Esophageal cancer Neg Hx    Rectal cancer Neg Hx    Stomach cancer Neg Hx     Outpatient Encounter Medications as of 02/03/2024  Medication  Sig   Accu-Chek FastClix Lancets MISC Accu-Chek Fastclix Lancet Drum  USE AS DIRECTED   Ascorbic Acid  (VITAMIN C ) 1000 MG tablet Take 1,000 mg by mouth daily.   aspirin  81 MG tablet Take 81 mg by mouth daily.   atorvastatin  (LIPITOR) 10 MG tablet Take 10 mg by mouth daily.   Biotin  10000 MCG TABS Take 10,000 mcg by mouth daily.   celecoxib  (CELEBREX ) 200 MG capsule Take 200 mg by mouth daily.   cetirizine (ZYRTEC) 10 MG tablet Take 10 mg by mouth daily.   Continuous Glucose Receiver (DEXCOM G7 RECEIVER) DEVI by Does not apply route.   ferrous sulfate  325 (65 FE) MG EC tablet Take 325 mg by mouth daily.   HYDROcodone -acetaminophen  (NORCO/VICODIN) 5-325 MG tablet Take 1 tablet by mouth every 6 (six) hours as needed for moderate pain (pain score 4-6).   ibuprofen (ADVIL) 200 MG tablet Take 200 mg by mouth every 6 (six) hours as needed for mild pain (pain score 1-3).   Insulin  Human (INSULIN  PUMP) SOLN Inject 1 each into the skin every 3 (three) days. Medronics 770 device   lidocaine  (LIDODERM ) 5 % Place 1 patch onto the skin daily. Up to 12 hours.   losartan  (COZAAR ) 100 MG tablet Take 100 mg by mouth daily.   Magnesium  400 MG CAPS Take 400 mg by mouth daily.   morphine (MSIR) 15 MG tablet Take 15 mg by mouth every 8 (eight) hours as needed for severe pain (pain score 7-10).   Multiple Vitamin (MULTIVITAMIN WITH MINERALS) TABS tablet Take 1 tablet by mouth daily.   naloxone (NARCAN) nasal spray 4 mg/0.1 mL Place 1 spray into the nose once.   ondansetron  (ZOFRAN -ODT) 4 MG disintegrating tablet Take 4 mg by mouth every 6 (six) hours as needed for nausea.   polyethylene glycol powder (GLYCOLAX /MIRALAX ) 17 GM/SCOOP powder Take 5 g by mouth once. Patient reports that she takes 1 tablespoon daily   Psyllium (METAMUCIL PO) Take 1 tablet by mouth in the morning and at bedtime. Gummies   SM OMEGA-3-6-9 FATTY ACIDS PO Take 1,600 mg by mouth 2 (two) times daily.  triamcinolone cream (KENALOG) 0.1 % Apply  1 Application topically daily as needed (dermatitis).   [DISCONTINUED] Continuous Glucose Sensor (DEXCOM G7 SENSOR) MISC Change sensor every 10 days as directed by provider to monitor blood sugars.   Continuous Glucose Sensor (DEXCOM G7 SENSOR) MISC Change sensor every 10 days as directed by provider to monitor blood sugars.   insulin  aspart (NOVOLOG ) 100 UNIT/ML injection Use with pump for TDD around 60 units per day   venlafaxine  XR (EFFEXOR -XR) 75 MG 24 hr capsule Take 75 mg by mouth daily with breakfast.   [DISCONTINUED] insulin  aspart (NOVOLOG ) 100 UNIT/ML injection Use with pump for TDD around 60 units per day (Patient not taking: Reported on 02/03/2024)   No facility-administered encounter medications on file as of 02/03/2024.    ALLERGIES: Allergies  Allergen Reactions   Doxepin Hcl Rash, Dermatitis and Hives   Erythromycin Diarrhea, Nausea And Vomiting and Other (See Comments)    Severe stomach pain   Other Rash and Other (See Comments)    Zolana dermatology cream     VACCINATION STATUS: Immunization History  Administered Date(s) Administered   Moderna Covid-19 Fall Seasonal Vaccine 55yrs & older 10/14/2022   Moderna Sars-Covid-2 Vaccination 04/03/2019, 05/01/2019, 12/09/2019, 06/14/2020   Pfizer Covid-19 Vaccine Bivalent Booster 71yrs & up 11/20/2020   Pfizer(Comirnaty)Fall Seasonal Vaccine 12 years and older 01/14/2022, 11/29/2023    Diabetes She presents for her follow-up diabetic visit. She has type 1 diabetes mellitus. Onset time: Diagnosed at approx age of 35. Her disease course has been stable. There are no hypoglycemic associated symptoms. There are no hypoglycemic complications. (In May 2021, she had hypoglycemic event where she was pulling into a parking space and she blacked out and required attention at the ED.) Symptoms are stable. Diabetic complications include nephropathy and peripheral neuropathy. Risk factors for coronary artery disease include diabetes  mellitus, hypertension, dyslipidemia, post-menopausal and sedentary lifestyle. Current diabetic treatment includes insulin  pump. She is compliant with treatment all of the time. Her weight is fluctuating minimally. She is following a generally healthy diet. Meal planning includes avoidance of concentrated sweets and ADA exchanges. She has not had a previous visit with a dietitian. She participates in exercise intermittently. Her home blood glucose trend is decreasing steadily. Her overall blood glucose range is 130-140 mg/dl. (She presents today with her CGM and pump showing improving glycemic profile with tightening readings overall.  Her POCT A1c today is 6.9%, increasing from last visit of 6.4%.   Analysis of her CGM shows TIR 72%, TAR 24%, TBR 4% with a GMI of 6.7%.  She has been having nocturnal hypoglycemia recently, having to treat with glucose.) An ACE inhibitor/angiotensin II receptor blocker is being taken. She does not see a podiatrist.Eye exam is current.    Review of systems  Constitutional: + Minimally fluctuating body weight,  current Body mass index is 24.66 kg/m. , no fatigue, no subjective hyperthermia, no subjective hypothermia Eyes: no blurry vision, no xerophthalmia ENT: no sore throat, no nodules palpated in throat, no dysphagia/odynophagia, no hoarseness Cardiovascular: no chest pain, no shortness of breath, no palpitations, no leg swelling Respiratory: no cough, no shortness of breath Gastrointestinal: no nausea/vomiting/diarrhea Musculoskeletal: no muscle/joint aches, left foot pain- saw podiatry has small fracture Skin: no rashes, no hyperemia, + varicose veins Neurological: no tremors, no numbness, no tingling, no dizziness Psychiatric: no depression, no anxiety  Objective:     BP 122/76 (BP Location: Left Arm, Patient Position: Sitting, Cuff Size: Large)   Pulse 72  Ht 5' 5 (1.651 m)   Wt 148 lb 3.2 oz (67.2 kg)   BMI 24.66 kg/m   Wt Readings from Last 3  Encounters:  02/03/24 148 lb 3.2 oz (67.2 kg)  10/30/23 147 lb 6.4 oz (66.9 kg)  09/23/23 149 lb (67.6 kg)     BP Readings from Last 3 Encounters:  02/03/24 122/76  10/30/23 118/66  09/23/23 (!) 125/59     Physical Exam- Limited  Constitutional:  Body mass index is 24.66 kg/m. , not in acute distress, normal state of mind Eyes:  EOMI, no exophthalmos Musculoskeletal: no gross deformities, strength intact in all four extremities, no gross restriction of joint movements Skin:  no rashes, no hyperemia Neurological: no tremor with outstretched hands   Diabetic Foot Exam - Simple   No data filed     CMP ( most recent) CMP     Component Value Date/Time   NA 132 (L) 09/23/2023 1701   NA 139 04/01/2023 1455   K 5.7 (H) 09/23/2023 1701   K 5.3 (H) 09/23/2023 1701   CL 92 (L) 09/23/2023 1701   CO2 25 09/23/2023 1701   GLUCOSE 439 (H) 09/23/2023 1701   BUN 31 (H) 09/23/2023 1701   BUN 20 04/01/2023 1455   CREATININE 1.11 (H) 09/23/2023 1701   CALCIUM  10.1 09/23/2023 1701   PROT 6.9 04/01/2023 1455   ALBUMIN 4.5 04/01/2023 1455   AST 22 04/01/2023 1455   ALT 18 04/01/2023 1455   ALKPHOS 82 04/01/2023 1455   BILITOT 0.3 04/01/2023 1455   GFRNONAA 52 (L) 09/23/2023 1701   GFRAA >60 02/13/2016 1419     Diabetic Labs (most recent): Lab Results  Component Value Date   HGBA1C 6.9 (A) 02/03/2024   HGBA1C 6.4 (A) 10/30/2023   HGBA1C 6.8 (A) 07/31/2023   MICROALBUR 10 mg/L 12/30/2022   MICROALBUR 10 mg/L 01/21/2022   MICROALBUR 10 07/06/2020     Lipid Panel ( most recent) Lipid Panel     Component Value Date/Time   CHOL 157 12/13/2022 0000   TRIG 58 12/13/2022 0000   HDL 60 12/13/2022 0000   LDLCALC 87 12/13/2022 0000      Lab Results  Component Value Date   TSH 2.360 10/23/2023   TSH 1.210 07/28/2023   TSH 2.270 05/09/2023   TSH 2.460 10/01/2022   FREET4 0.86 10/23/2023   FREET4 0.80 (L) 07/28/2023   FREET4 0.80 (L) 05/09/2023   FREET4 0.82 10/01/2022            Assessment & Plan:   1) Type 1 diabetes mellitus without complication (HCC)  She presents today with her CGM and pump showing improving glycemic profile with tightening readings overall.  Her POCT A1c today is 6.9%, increasing from last visit of 6.4%.   Analysis of her CGM shows TIR 72%, TAR 24%, TBR 4% with a GMI of 6.7%.  She has been having nocturnal hypoglycemia recently, having to treat with glucose.  [place] Candis NOVAK Medlen has currently uncontrolled symptomatic type 2 DM since 73 years of age.  -Recent labs reviewed.  - I had a long discussion with her about the progressive nature of diabetes and the pathology behind its complications. -her diabetes is complicated by retinopathy, PVD, mild CKD and she remains at a high risk for more acute and chronic complications which include CAD, CVA, CKD, retinopathy, and neuropathy. These are all discussed in detail with her.  - Nutritional counseling repeated/built upon at each appointment.  - The patient  admits there is a room for improvement in their diet and drink choices. -  Suggestion is made for the patient to avoid simple carbohydrates from their diet including Cakes, Sweet Desserts / Pastries, Ice Cream, Soda (diet and regular), Sweet Tea, Candies, Chips, Cookies, Sweet Pastries, Store Bought Juices, Alcohol in Excess of 1-2 drinks a day, Artificial Sweeteners, Coffee Creamer, and Sugar-free Products. This will help patient to have stable blood glucose profile and potentially avoid unintended weight gain.   - I encouraged the patient to switch to unprocessed or minimally processed complex starch and increased protein intake (animal or plant source), fruits, and vegetables.   - Patient is advised to stick to a routine mealtimes to eat 3 meals a day and avoid unnecessary snacks (to snack only to correct hypoglycemia).  - I have approached her with the following individualized plan to manage her diabetes and patient agrees:    -For safety purposes, avoiding hypoglycemia is the number 1 priority in her case.  I discussed with her that an acceptable target A1c for her would be between 7-7.5%.  -I did adjust her basal rate down to 0.35 units/hr today to help her avoid nocturnal hypoglycemia.    -she is encouraged to continue monitoring glucose 4 times daily (using her CGM or back up meter), before meals and before bed, and to call the clinic if she has readings less than 70 or above 200 for 3 tests in a row.   - she is warned not to take insulin  without proper monitoring per orders. - Adjustment parameters are given to her for hypo and hyperglycemia in writing.  -Given her type 1 diabetes diagnosis, insulin  is the only choice for treatment of her diabetes.  - Specific targets for  A1c; LDL, HDL, and Triglycerides were discussed with the patient.  2) Blood Pressure /Hypertension:  her blood pressure is controlled to target.   she is advised to continue her current medications as prescribed by her PCP.  3) Lipids/Hyperlipidemia:    Review of her recent lipid panel from 12/13/22 showed controlled LDL at 87 .  she is advised to continue Lipitor 10 mg daily at bedtime.  Side effects and precautions discussed with her.  4)  Weight/Diet:  her Body mass index is 24.66 kg/m.  -   she is not a candidate for weight loss. Exercise, and detailed carbohydrates information provided  -  detailed on discharge instructions.  5) Chronic Care/Health Maintenance: -she is on ACEI/ARB and Statin medications and is encouraged to initiate and continue to follow up with Ophthalmology, Dentist, Podiatrist at least yearly or according to recommendations, and advised to stay away from smoking. I have recommended yearly flu vaccine and pneumonia vaccine at least every 5 years; moderate intensity exercise for up to 150 minutes weekly; and sleep for at least 7 hours a day.  6) Hypercalcemia PTH was normal, ruling out parathyroid problem.  She  was taking calcium  supplement which I did have her stop.  Will check CMP prior to next visit.  7) Abnormal thyroid  function tests-resolved Thyroid  labs recently checked due to fatigue and nail ridges.  Antibody testing was negative, ruling out autoimmune thyroid  dysfunction.  TSH and Free T3 came back normal, but Free T4 is slightly low.  On recheck it was similar presentation with low FT4 but normal TSH and FT3.    Repeat thyroid  labs were normal  - she is advised to maintain close follow up with Lonna Millman, DO for primary care needs,  as well as her other providers for optimal and coordinated care.       I spent  48  minutes in the care of the patient today including review of labs from CMP, Lipids, Thyroid  Function, Hematology (current and previous including abstractions from other facilities); face-to-face time discussing  her blood glucose readings/logs, discussing hypoglycemia and hyperglycemia episodes and symptoms, medications doses, her options of short and long term treatment based on the latest standards of care / guidelines;  discussion about incorporating lifestyle medicine;  and documenting the encounter. Risk reduction counseling performed per USPSTF guidelines to reduce obesity and cardiovascular risk factors.     Please refer to Patient Instructions for Blood Glucose Monitoring and Insulin /Medications Dosing Guide  in media tab for additional information. Please  also refer to  Patient Self Inventory in the Media  tab for reviewed elements of pertinent patient history.  Candis NOVAK Busta participated in the discussions, expressed understanding, and voiced agreement with the above plans.  All questions were answered to her satisfaction. she is encouraged to contact clinic should she have any questions or concerns prior to her return visit.   Follow up plan: - Return in about 3 months (around 05/03/2024) for Diabetes F/U with A1c in office, Bring meter and logs, Previsit  labs.   Benton Rio, [facility] [address] [place], [place] [address] Phone: [phone] Fax: [phone]  02/03/2024, 2:03 PM     "

## 2024-02-12 ENCOUNTER — Telehealth: Payer: Self-pay | Admitting: Nurse Practitioner

## 2024-02-12 LAB — COMPREHENSIVE METABOLIC PANEL WITH GFR
ALT: 20 IU/L (ref 0–32)
AST: 24 IU/L (ref 0–40)
Albumin: 4.6 g/dL (ref 3.8–4.8)
Alkaline Phosphatase: 68 IU/L (ref 49–135)
BUN/Creatinine Ratio: 26 (ref 12–28)
BUN: 26 mg/dL (ref 8–27)
Bilirubin Total: 0.5 mg/dL (ref 0.0–1.2)
CO2: 28 mmol/L (ref 20–29)
Calcium: 10.7 mg/dL — ABNORMAL HIGH (ref 8.7–10.3)
Chloride: 98 mmol/L (ref 96–106)
Creatinine, Ser: 0.99 mg/dL (ref 0.57–1.00)
Globulin, Total: 2.1 g/dL (ref 1.5–4.5)
Glucose: 127 mg/dL — ABNORMAL HIGH (ref 70–99)
Potassium: 4.5 mmol/L (ref 3.5–5.2)
Sodium: 138 mmol/L (ref 134–144)
Total Protein: 6.7 g/dL (ref 6.0–8.5)
eGFR: 59 mL/min/1.73 — ABNORMAL LOW

## 2024-02-12 NOTE — Telephone Encounter (Signed)
 Pt did labs already for her march appt. Are those ok?

## 2024-02-12 NOTE — Telephone Encounter (Signed)
 Yes, that's fine!

## 2024-03-04 ENCOUNTER — Telehealth: Payer: Self-pay | Admitting: *Deleted

## 2024-03-04 NOTE — Telephone Encounter (Signed)
 Patient left a message. She states that at her last office visit, December 30,2025, adjustment was made in the basal amount over night, patient does not feel that this is working out as well. She reports that this morning when she got up her BS was 237. Before she decided to eat something,have a cup of coffee with half n half in it, it had gotten up to 280.  She would like for Whitney to give her a call.

## 2024-03-04 NOTE — Telephone Encounter (Signed)
 Patient was called and made aware.

## 2024-03-04 NOTE — Telephone Encounter (Signed)
 I see what she is referring to but her glucose is still too tight overnight which I believe is causing her liver to dump glucose causing this spike when she wakes up.  She may need to do a small correction bolus when she wakes up (before eating) to help offset the spike.  I don't think the answer is to increase the basal rate.

## 2024-05-03 ENCOUNTER — Ambulatory Visit: Admitting: Nurse Practitioner
